# Patient Record
Sex: Female | Born: 1940 | Race: White | Hispanic: No | Smoking: Never smoker
Health system: Southern US, Community
[De-identification: ages and names within clinical notes are randomized; demographics above are authoritative.]

## PROBLEM LIST (undated history)

## (undated) DIAGNOSIS — F419 Anxiety disorder, unspecified: Principal | ICD-10-CM

## (undated) DIAGNOSIS — F329 Major depressive disorder, single episode, unspecified: Secondary | ICD-10-CM

## (undated) DIAGNOSIS — F32A Depression, unspecified: Secondary | ICD-10-CM

## (undated) DIAGNOSIS — F0390 Unspecified dementia without behavioral disturbance: Secondary | ICD-10-CM

## (undated) HISTORY — DX: Anxiety disorder, unspecified: F41.9

## (undated) HISTORY — PX: ABDOMINAL HYSTERECTOMY: SHX81

## (undated) HISTORY — DX: Major depressive disorder, single episode, unspecified: F32.9

## (undated) HISTORY — DX: Depression, unspecified: F32.A

---

## 2013-01-16 DIAGNOSIS — F329 Major depressive disorder, single episode, unspecified: Secondary | ICD-10-CM | POA: Diagnosis not present

## 2013-03-12 DIAGNOSIS — H2510 Age-related nuclear cataract, unspecified eye: Secondary | ICD-10-CM | POA: Diagnosis not present

## 2013-03-25 DIAGNOSIS — F329 Major depressive disorder, single episode, unspecified: Secondary | ICD-10-CM | POA: Diagnosis not present

## 2013-03-25 DIAGNOSIS — F3289 Other specified depressive episodes: Secondary | ICD-10-CM | POA: Diagnosis not present

## 2013-03-25 DIAGNOSIS — I2510 Atherosclerotic heart disease of native coronary artery without angina pectoris: Secondary | ICD-10-CM | POA: Diagnosis not present

## 2013-03-25 DIAGNOSIS — R300 Dysuria: Secondary | ICD-10-CM | POA: Diagnosis not present

## 2014-01-06 DIAGNOSIS — F3289 Other specified depressive episodes: Secondary | ICD-10-CM | POA: Diagnosis not present

## 2014-01-06 DIAGNOSIS — R300 Dysuria: Secondary | ICD-10-CM | POA: Diagnosis not present

## 2014-01-06 DIAGNOSIS — F329 Major depressive disorder, single episode, unspecified: Secondary | ICD-10-CM | POA: Diagnosis not present

## 2014-01-06 DIAGNOSIS — I2510 Atherosclerotic heart disease of native coronary artery without angina pectoris: Secondary | ICD-10-CM | POA: Diagnosis not present

## 2014-01-06 DIAGNOSIS — Z0000 Encounter for general adult medical examination without abnormal findings: Secondary | ICD-10-CM | POA: Diagnosis not present

## 2014-01-12 DIAGNOSIS — F329 Major depressive disorder, single episode, unspecified: Secondary | ICD-10-CM | POA: Diagnosis not present

## 2014-01-12 DIAGNOSIS — F3289 Other specified depressive episodes: Secondary | ICD-10-CM | POA: Diagnosis not present

## 2014-01-12 DIAGNOSIS — F411 Generalized anxiety disorder: Secondary | ICD-10-CM | POA: Diagnosis not present

## 2014-01-12 DIAGNOSIS — Z0000 Encounter for general adult medical examination without abnormal findings: Secondary | ICD-10-CM | POA: Diagnosis not present

## 2014-04-06 DIAGNOSIS — R0982 Postnasal drip: Secondary | ICD-10-CM | POA: Diagnosis not present

## 2014-04-06 DIAGNOSIS — H9319 Tinnitus, unspecified ear: Secondary | ICD-10-CM | POA: Diagnosis not present

## 2014-04-06 DIAGNOSIS — H903 Sensorineural hearing loss, bilateral: Secondary | ICD-10-CM | POA: Diagnosis not present

## 2014-04-06 DIAGNOSIS — R439 Unspecified disturbances of smell and taste: Secondary | ICD-10-CM | POA: Diagnosis not present

## 2015-01-19 ENCOUNTER — Encounter: Payer: Self-pay | Admitting: Primary Care

## 2015-01-19 ENCOUNTER — Ambulatory Visit (INDEPENDENT_AMBULATORY_CARE_PROVIDER_SITE_OTHER): Payer: Medicare Other | Admitting: Primary Care

## 2015-01-19 ENCOUNTER — Other Ambulatory Visit: Payer: Medicare Other

## 2015-01-19 VITALS — BP 134/74 | HR 70 | Temp 97.4°F | Ht 61.5 in | Wt 137.8 lb

## 2015-01-19 DIAGNOSIS — M25511 Pain in right shoulder: Secondary | ICD-10-CM

## 2015-01-19 DIAGNOSIS — F419 Anxiety disorder, unspecified: Principal | ICD-10-CM

## 2015-01-19 DIAGNOSIS — R5383 Other fatigue: Secondary | ICD-10-CM | POA: Diagnosis not present

## 2015-01-19 DIAGNOSIS — F418 Other specified anxiety disorders: Secondary | ICD-10-CM | POA: Diagnosis not present

## 2015-01-19 DIAGNOSIS — F329 Major depressive disorder, single episode, unspecified: Secondary | ICD-10-CM

## 2015-01-19 DIAGNOSIS — M25512 Pain in left shoulder: Secondary | ICD-10-CM

## 2015-01-19 DIAGNOSIS — M25519 Pain in unspecified shoulder: Secondary | ICD-10-CM | POA: Insufficient documentation

## 2015-01-19 DIAGNOSIS — Z79899 Other long term (current) drug therapy: Secondary | ICD-10-CM | POA: Diagnosis not present

## 2015-01-19 MED ORDER — CITALOPRAM HYDROBROMIDE 20 MG PO TABS: 20.0000 mg | ORAL_TABLET | Freq: Every day | ORAL | Status: DC

## 2015-01-19 MED ORDER — CLONAZEPAM 1 MG PO TABS: 1.0000 mg | ORAL_TABLET | ORAL | Status: DC | PRN

## 2015-01-19 MED ORDER — BUPROPION HCL ER (XL) 300 MG PO TB24: 300.0000 mg | ORAL_TABLET | Freq: Every day | ORAL | Status: DC

## 2015-01-19 NOTE — Assessment & Plan Note (Signed)
Present for one year. No injury or trauma. Takes tylenol for pain which helps. Recommended ibuprofen and ice. Will continue to monitor and consider PT if symptoms worsen.

## 2015-01-19 NOTE — Progress Notes (Signed)
Pre visit review using our clinic review tool, if applicable. No additional management support is needed unless otherwise documented below in the visit note. 

## 2015-01-19 NOTE — Progress Notes (Signed)
Subjective:    Patient ID: Joanna Reed, female    DOB: 18-Sep-1941, 74 y.o.   MRN: [id number]  HPI  Ms. Cange is a 74 year old female who presents today to establish care and discuss the problems mentioned below. Will obtain old records.  1) History of anxiety and depression: Diagnosed in her 40's. Her symptoms began with anxiety that resulted in depression. She's felt well since being on citalopram and bupropion over the years. Denies headaches, nausea, suicidal thoughts. She is also taking clonezepam only when traveling and sleeping in different places as she will become anxious and have difficulty sleeping. She will typically go through about 30 tablets of clonazepam in one year.   2) Diet and Exercise: Diet consists of organic frozen meals, salads, fish 2-3 times weekly, eats occasional beef. She watches the sugar content carefully. Exercise: 2-3 times weekly. She will do high intensity work out with steps 15 min at a time.   She will exercise her mind with brain games on the internet and crossword puzzles.  3) Occasional Fatigue: Over past several years she's noticed her energy level decrease. She feels more "drained" and cannot consistently be active for prolonged periods of time. She states this is transient and once she rests her energy level returns to baseline. Denies bloody stools or pale skin.  4) Right shoulder pain: Stiffness, painful to lift up, painful when reaching towards back. At night will have burning sensation occasionally. This has been present for about 1 year and she first noticed it after lifting her flower pots to a shelf above her head.  Denies recent injury or trauma. Tylenol helps to decrease pain. The pain does not interfere with her daily activities and she is noaware of her pain all of the time.  Review of Systems  Constitutional: Negative for fatigue and unexpected weight change.  HENT: Negative for rhinorrhea.   Respiratory: Negative for cough and  shortness of breath.   Cardiovascular: Negative for chest pain.  Gastrointestinal: Negative for diarrhea, constipation and blood in stool.       Taking probiotic daily which helps digestion.  Genitourinary: Negative for dysuria and frequency.  Musculoskeletal: Positive for arthralgias.       Right shoulder. See HPI  Skin: Negative for pallor and rash.  Neurological: Negative for dizziness, numbness and headaches.  Psychiatric/Behavioral:       Being treated for anxiety and depression       Past Medical History  Diagnosis Date  . Anxiety and depression     History   Social History  . Marital Status: Divorced    Spouse Name: N/A  . Number of Children: N/A  . Years of Education: N/A   Occupational History  . Not on file.   Social History Main Topics  . Smoking status: Never Smoker   . Smokeless tobacco: Not on file  . Alcohol Use: 0.0 oz/week    0 Standard drinks or equivalent per week     Comment: occ  . Drug Use: No  . Sexual Activity: Not on file   Other Topics Concern  . Not on file   Social History Narrative   Enjoys traveling   Daughter lives in [place] with her husband   Has 2 grandchildren in [place] (boys, 2,4)    Past Surgical History  Procedure Laterality Date  . Abdominal hysterectomy      1980    Family History  Problem Relation Age of Onset  . Arthritis Mother   .  Heart disease Father     Deceased from MI  . Stroke Maternal Grandmother 85  . Arthritis Maternal Grandfather     No Known Allergies  No current outpatient prescriptions on file prior to visit.   No current facility-administered medications on file prior to visit.    BP 134/74 mmHg  Pulse 70  Temp(Src) 97.4 F (36.3 C) (Oral)  Ht 5' 1.5" (1.562 m)  Wt 137 lb 12.8 oz (62.506 kg)  BMI 25.62 kg/m2  SpO2 96%    Objective:   Physical Exam  Constitutional: She is oriented to person, place, and time. She appears well-developed.  HENT:  Right Ear: External ear  normal.  Left Ear: External ear normal.  Nose: Nose normal.  Mouth/Throat: Oropharynx is clear and moist.  Neck: Neck supple.  Cardiovascular: Normal rate and regular rhythm.   Pulmonary/Chest: Effort normal and breath sounds normal.  Abdominal: Soft. Bowel sounds are normal.  Musculoskeletal: She exhibits no tenderness.       Right shoulder: She exhibits decreased range of motion and pain. She exhibits no tenderness, no swelling, no crepitus, normal pulse and normal strength.  Pain present with act of placing right arm behind her back but will then dissipate after arm is in position.  Lymphadenopathy:    She has no cervical adenopathy.  Neurological: She is alert and oriented to person, place, and time. She has normal reflexes. No cranial nerve deficit.  Skin: Skin is warm and dry. No pallor.  Psychiatric: She has a normal mood and affect.          Assessment & Plan:

## 2015-01-19 NOTE — Assessment & Plan Note (Signed)
Checking CBC, TSH, and CMET next week. Rule out electrolyte imbalance, anemia, hypothyroidism. Will continue to monitor.

## 2015-01-19 NOTE — Assessment & Plan Note (Signed)
Stable on citalopram and bupropion. Uses clonazepam very sparingly. Obtained controlled substance contract and UDS. Will continue to monitor.

## 2015-01-19 NOTE — Patient Instructions (Addendum)
Please schedule a fasting physical with me in the next month. Refills have been sent to your pharmacy. You may use hydrocortisone cream to your rash daily as needed. Do not apply to face. Try taking ibuprofen 400-600mg  daily for your right shoulder pain. It was very nice meeting you! See you soon! Welcome to [facility]!

## 2015-01-20 ENCOUNTER — Other Ambulatory Visit (INDEPENDENT_AMBULATORY_CARE_PROVIDER_SITE_OTHER): Payer: Medicare Other

## 2015-01-20 DIAGNOSIS — R5383 Other fatigue: Secondary | ICD-10-CM | POA: Diagnosis not present

## 2015-01-20 LAB — COMPREHENSIVE METABOLIC PANEL
ALBUMIN: 4.2 g/dL (ref 3.5–5.2)
ALK PHOS: 47 U/L (ref 39–117)
ALT: 10 U/L (ref 0–35)
AST: 14 U/L (ref 0–37)
BILIRUBIN TOTAL: 0.4 mg/dL (ref 0.2–1.2)
BUN: 16 mg/dL (ref 6–23)
CO2: 31 mEq/L (ref 19–32)
Calcium: 9.2 mg/dL (ref 8.4–10.5)
Chloride: 105 mEq/L (ref 96–112)
Creatinine, Ser: 0.77 mg/dL (ref 0.40–1.20)
GFR: 77.79 mL/min (ref >60.00)
Glucose, Bld: 98 mg/dL (ref 70–99)
Potassium: 3.9 mEq/L (ref 3.5–5.1)
SODIUM: 138 meq/L (ref 135–145)
TOTAL PROTEIN: 7 g/dL (ref 6.0–8.3)

## 2015-01-20 LAB — CBC WITH DIFFERENTIAL/PLATELET
Basophils Absolute: 0 10*3/uL (ref 0.0–0.1)
Basophils Relative: 0.7 % (ref 0.0–3.0)
EOS ABS: 0.1 10*3/uL (ref 0.0–0.7)
Eosinophils Relative: 2.3 % (ref 0.0–5.0)
HEMATOCRIT: 37.8 % (ref 36.0–46.0)
HEMOGLOBIN: 12.8 g/dL (ref 12.0–15.0)
Lymphocytes Relative: 30.8 % (ref 12.0–46.0)
Lymphs Abs: 1.7 10*3/uL (ref 0.7–4.0)
MCHC: 34 g/dL (ref 30.0–36.0)
MCV: 90.3 fl (ref 78.0–100.0)
MONOS PCT: 7.3 % (ref 3.0–12.0)
Monocytes Absolute: 0.4 10*3/uL (ref 0.1–1.0)
Neutro Abs: 3.2 10*3/uL (ref 1.4–7.7)
Neutrophils Relative %: 58.9 % (ref 43.0–77.0)
Platelets: 271 10*3/uL (ref 150.0–400.0)
RBC: 4.19 Mil/uL (ref 3.87–5.11)
RDW: 12.7 % (ref 11.5–15.5)
WBC: 5.5 10*3/uL (ref 4.0–10.5)

## 2015-01-20 LAB — TSH: TSH: 1.92 u[IU]/mL (ref 0.35–4.50)

## 2015-01-28 ENCOUNTER — Ambulatory Visit (INDEPENDENT_AMBULATORY_CARE_PROVIDER_SITE_OTHER): Payer: Medicare Other | Admitting: Primary Care

## 2015-01-28 ENCOUNTER — Encounter: Payer: Self-pay | Admitting: Primary Care

## 2015-01-28 VITALS — BP 132/80 | HR 84 | Temp 97.6°F | Ht 62.0 in | Wt 136.1 lb

## 2015-01-28 DIAGNOSIS — M25511 Pain in right shoulder: Secondary | ICD-10-CM

## 2015-01-28 DIAGNOSIS — Z83438 Family history of other disorder of lipoprotein metabolism and other lipidemia: Secondary | ICD-10-CM

## 2015-01-28 DIAGNOSIS — Z13220 Encounter for screening for lipoid disorders: Secondary | ICD-10-CM | POA: Diagnosis not present

## 2015-01-28 DIAGNOSIS — R5383 Other fatigue: Secondary | ICD-10-CM

## 2015-01-28 DIAGNOSIS — Z0000 Encounter for general adult medical examination without abnormal findings: Secondary | ICD-10-CM

## 2015-01-28 DIAGNOSIS — Z8349 Family history of other endocrine, nutritional and metabolic diseases: Secondary | ICD-10-CM

## 2015-01-28 DIAGNOSIS — E2839 Other primary ovarian failure: Secondary | ICD-10-CM

## 2015-01-28 DIAGNOSIS — Z13820 Encounter for screening for osteoporosis: Secondary | ICD-10-CM | POA: Diagnosis not present

## 2015-01-28 DIAGNOSIS — R635 Abnormal weight gain: Secondary | ICD-10-CM

## 2015-01-28 NOTE — Assessment & Plan Note (Signed)
Continues to bother patient, she's trying to take ibuprofen but doesn't like to do so. She declines PT at this time. Will refer to Dr. Lorelei Pont for further evaluation.

## 2015-01-28 NOTE — Progress Notes (Signed)
HPI:  [name] is a 74 year old female who presents today for an annual wellness subsequent exam.  Past Medical History  Diagnosis Date  . Anxiety and depression     Current Outpatient Prescriptions  Medication Sig Dispense Refill  . buPROPion (WELLBUTRIN XL) 300 MG 24 hr tablet Take 1 tablet (300 mg total) by mouth daily. 30 tablet 5  . citalopram (CELEXA) 20 MG tablet Take 1 tablet (20 mg total) by mouth daily. 30 tablet 5  . clonazePAM (KLONOPIN) 1 MG tablet Take 1 tablet (1 mg total) by mouth as needed for anxiety. 30 tablet 0   No current facility-administered medications for this visit.    No Known Allergies  Family History  Problem Relation Age of Onset  . Arthritis Mother   . Heart disease Father     Deceased from MI  . Stroke Maternal Grandmother 85  . Arthritis Maternal Grandfather     History   Social History  . Marital Status: Divorced    Spouse Name: N/A  . Number of Children: N/A  . Years of Education: N/A   Occupational History  . Not on file.   Social History Main Topics  . Smoking status: Never Smoker   . Smokeless tobacco: Not on file  . Alcohol Use: 0.0 oz/week    0 Standard drinks or equivalent per week     Comment: occ  . Drug Use: No  . Sexual Activity: Not on file   Other Topics Concern  . Not on file   Social History Narrative   Enjoys traveling   Daughter lives in [place] with her husband   Has 2 grandchildren in [place] (boys, 2,4)    Hospitiliaztions: None  Health Maintenance:    Flu: Declines  Tetanus: Declines  Pneumovax: None  Zostavax: None, Declines  Bone Density: Been over 2 years, will order.  Colonoscopy: Declines  Eye Doctor: 2 years ago. New RX for her glasses.  Dental Exam: Over 2 years ago  Providers: Pleas Koch  I have personally reviewed and have noted: 1. The patient's medical and social history 2. Their use of alcohol, tobacco or illicit drugs 3. Their current medications and  supplements 4. The patient's functional ability including ADL's, fall risks, home safety risks and hearing or visual impairment. 5. Diet and physical activities 6. Evidence for depression or mood disorder  Subjective:   Review of Systems:   Constitutional: Denies fever, malaise, fatigue, headache or abrupt weight changes.  HEENT: Denies eye pain, eye redness, ear pain, ringing in the ears, wax buildup, runny nose, nasal congestion, bloody nose, or sore throat. Respiratory: Denies difficulty breathing, shortness of breath, cough or sputum production.   Cardiovascular: Denies chest pain, chest tightness, palpitations or swelling in the hands or feet.  Gastrointestinal: Denies abdominal pain, bloating, constipation, diarrhea or blood in the stool.  GU: Denies urgency, frequency, pain with urination, burning sensation, blood in urine, odor or discharge. Musculoskeletal: Right mid humeral and shoulder pain. Pain with abduction.  Skin: Denies redness, rashes, lesions or ulcercations.  Neurological: Denies dizziness, difficulty with memory, difficulty with speech or problems with balance and coordination.  Psychiatric: History of anxiety for which she is being treated. She is stable on her medications. Denies SI/HI.  No other specific complaints in a complete review of systems (except as listed in HPI above).  Objective:  PE:   BP 132/80 mmHg  Pulse 84  Temp(Src) 97.6 F (36.4 C) (Oral)  Ht 5\' 2"  (  1.575 m)  Wt 136 lb 1.9 oz (61.744 kg)  BMI 24.89 kg/m2  SpO2 95% Wt Readings from Last 3 Encounters:  01/28/15 136 lb 1.9 oz (61.744 kg)  01/19/15 137 lb 12.8 oz (62.506 kg)    General: Appears their stated age, well developed, well nourished in NAD. Skin: Warm, dry and intact. No rashes, lesions or ulcerations noted. HEENT: Head: normal shape and size; Eyes: sclera white, no icterus, conjunctiva pink, PERRLA and EOMs intact; Ears: Tm's gray and intact, normal light reflex; Nose: mucosa  pink and moist, septum midline; Throat/Mouth: Teeth present, mucosa pink and moist, no exudate, lesions or ulcerations noted.  Neck: Normal range of motion. Neck supple, trachea midline. No massses, lumps or thyromegaly present.  Cardiovascular: Normal rate and rhythm. S1,S2 noted.  No murmur, rubs or gallops noted. No JVD or BLE edema. No carotid bruits noted. Pulmonary/Chest: Normal effort and positive vesicular breath sounds. No respiratory distress. No wheezes, rales or ronchi noted.  Abdomen: Soft and nontender. Normal bowel sounds, no bruits noted. No distention or masses noted. Liver, spleen and kidneys non palpable. Musculoskeletal: Normal range of motion. No signs of joint swelling. No difficulty with gait.  Neurological: Alert and oriented. Cranial nerves II-XII intact. Coordination normal. +DTRs bilaterally. Psychiatric: Mood and affect normal. Behavior is normal. Judgment and thought content normal.   EKG:  BMET    Component Value Date/Time   NA 138 01/20/2015 1148   K 3.9 01/20/2015 1148   CL 105 01/20/2015 1148   CO2 31 01/20/2015 1148   GLUCOSE 98 01/20/2015 1148   BUN 16 01/20/2015 1148   CREATININE 0.77 01/20/2015 1148   CALCIUM 9.2 01/20/2015 1148    Lipid Panel  No results found for: CHOL, TRIG, HDL, CHOLHDL, VLDL, LDLCALC  CBC    Component Value Date/Time   WBC 5.5 01/20/2015 1148   RBC 4.19 01/20/2015 1148   HGB 12.8 01/20/2015 1148   HCT 37.8 01/20/2015 1148   PLT 271.0 01/20/2015 1148   MCV 90.3 01/20/2015 1148   MCHC 34.0 01/20/2015 1148   RDW 12.7 01/20/2015 1148   LYMPHSABS 1.7 01/20/2015 1148   MONOABS 0.4 01/20/2015 1148   EOSABS 0.1 01/20/2015 1148   BASOSABS 0.0 01/20/2015 1148    Hgb A1C No results found for: HGBA1C    Assessment and Plan:   Medicare Annual Wellness Visit:  Diet: Heart healthy Physical activity: Exercises nearly everyday at home. Depression/mood screen: Negative Hearing: Intact to whispered voice Visual acuity:  Grossly normal, performs annual eye exam  ADLs: Capable Fall risk: None Home safety: Good Cognitive evaluation: Intact to orientation, naming, recall and repetition EOL planning: Adv directives, full code/ I agree  Preventative Medicine:  Next appointment: 6 months for follow up of anxiety, or sooner if needed.

## 2015-01-28 NOTE — Patient Instructions (Signed)
You will be contacted regarding your referral to Sports Medicine. You will be contacted regarding your Dexa Bone Scan Please come back at your convenience for your lipid panel. Please update your insurance information. Take care!

## 2015-01-28 NOTE — Progress Notes (Deleted)
Subjective:    Patient ID: Joanna Reed, female    DOB: [birth year], 74 y.o.   MRN: [id number]  HPI  Mammogram: Last mammogram was 4-5.  Influenza: Declines Zostavax: Declines Pneumonia: Declines DEXA: Last one 5-10 years ago ECG: Declines Pap: No uterus Tetanus: Declines   Review of Systems  Constitutional: Negative for fever and chills.  HENT: Negative for rhinorrhea.   Respiratory: Negative for shortness of breath.   Cardiovascular: Negative for chest pain.  Gastrointestinal: Negative for diarrhea and constipation.  Genitourinary: Negative for dysuria and frequency.  Musculoskeletal: Negative for myalgias and arthralgias.  Skin: Negative for rash.  Neurological: Negative for dizziness and headaches.  Hematological: Negative for adenopathy.  Psychiatric/Behavioral:       Denies concerns for anxiety or depression       Objective:   Physical Exam        Assessment & Plan:     Subjective:   Joanna Reed is a 74 y.o. female who presents for Medicare Annual (Subsequent) preventive examination.  Review of Systems:  ***       Objective:     Vitals: BP 132/80 mmHg  Pulse 84  Temp(Src) 97.6 F (36.4 C) (Oral)  Ht 5\' 2"  (1.575 m)  Wt 136 lb 1.9 oz (61.744 kg)  BMI 24.89 kg/m2  SpO2 95%  Tobacco History  Smoking status  . Never Smoker   Smokeless tobacco  . Not on file     Counseling given: Not Answered   Past Medical History  Diagnosis Date  . Anxiety and depression    Past Surgical History  Procedure Laterality Date  . Abdominal hysterectomy      1980   Family History  Problem Relation Age of Onset  . Arthritis Mother   . Heart disease Father     Deceased from MI  . Stroke Maternal Grandmother 85  . Arthritis Maternal Grandfather    History  Sexual Activity  . Sexual Activity: Not on file    Outpatient Encounter Prescriptions as of 01/28/2015  Medication Sig  . buPROPion (WELLBUTRIN XL) 300 MG 24 hr tablet Take 1 tablet  (300 mg total) by mouth daily.  . citalopram (CELEXA) 20 MG tablet Take 1 tablet (20 mg total) by mouth daily.  . clonazePAM (KLONOPIN) 1 MG tablet Take 1 tablet (1 mg total) by mouth as needed for anxiety.    Activities of Daily Living No flowsheet data found.  Patient Care Team: Alma Friendly, NP as PCP - General (Nurse Practitioner)    Assessment:    Follows a healthy diet. Does exercise with your steps at a very high aerobic level. Exercise Activities and Dietary recommendations    Goals    None     Fall Risk No flowsheet data found. Depression Screen No flowsheet data found.   Cognitive Testing No flowsheet data found.   There is no immunization history on file for this patient. Screening Tests Health Maintenance  Topic Date Due  . TETANUS/TDAP  08/06/1959  . MAMMOGRAM  08/05/1990  . COLONOSCOPY  08/05/1990  . ZOSTAVAX  08/05/2000  . DEXA SCAN  08/05/2005  . PNA vac Low Risk Adult (1 of 2 - PCV13) 08/05/2005  . INFLUENZA VACCINE  06/24/2015 (Originally 05/24/2015)      Plan:   *** During the course of the visit the patient was educated and counseled about the following appropriate screening and preventive services:   Vaccines to include Pneumoccal, Influenza, Hepatitis B, Td, Zostavax, HCV  Electrocardiogram  Cardiovascular Disease  Colorectal cancer screening  Bone density screening  Diabetes screening  Glaucoma screening  Mammography/PAP  Nutrition counseling   Patient Instructions (the written plan) was given to the patient.   Sheral Flow, NP  01/28/2015

## 2015-01-28 NOTE — Assessment & Plan Note (Signed)
Stable, unremarkable exam. Declines all vaccines, mammogram, colonoscopy. Will agree to Dexa scan.

## 2015-01-28 NOTE — Progress Notes (Signed)
Pre visit review using our clinic review tool, if applicable. No additional management support is needed unless otherwise documented below in the visit note. 

## 2015-01-29 ENCOUNTER — Telehealth: Payer: Self-pay | Admitting: Primary Care

## 2015-01-29 NOTE — Telephone Encounter (Signed)
Called and notified patient of [name]'s comments. Patient will be going out of town in the next 2 weeks so will call later to schedule lap appt for the lipid. Patient verbalized understanding.

## 2015-01-29 NOTE — Telephone Encounter (Signed)
Please notify Joanna Reed that I was able to get her lipid panel and Dexa scan to go through her insurance. She can have her lipid panel drawn at her convenience as I have pre-ordered it. She will be contacted regarding her Dexa scan.  Thanks!

## 2015-01-29 NOTE — Addendum Note (Signed)
Addended by: Pleas Koch on: 01/29/2015 07:26 AM   Modules accepted: Miquel Dunn

## 2015-01-29 NOTE — Addendum Note (Signed)
Addended by: Pleas Koch on: 01/29/2015 09:25 AM   Modules accepted: Orders

## 2015-02-05 ENCOUNTER — Encounter: Payer: Self-pay | Admitting: Primary Care

## 2015-02-08 NOTE — Addendum Note (Signed)
Addended by: Pleas Koch on: 02/08/2015 11:40 AM   Modules accepted: Miquel Dunn

## 2015-02-08 NOTE — Addendum Note (Signed)
Addended by: Pleas Koch on: 02/08/2015 11:44 AM   Modules accepted: Level of Service, SmartSet

## 2015-02-18 ENCOUNTER — Ambulatory Visit: Admit: 2015-02-18 | Disposition: A | Discharge status: Home / Self Care | Payer: Self-pay

## 2015-02-18 DIAGNOSIS — E2839 Other primary ovarian failure: Secondary | ICD-10-CM | POA: Diagnosis not present

## 2015-02-18 DIAGNOSIS — Z780 Asymptomatic menopausal state: Secondary | ICD-10-CM | POA: Diagnosis not present

## 2015-02-18 DIAGNOSIS — Z13820 Encounter for screening for osteoporosis: Secondary | ICD-10-CM | POA: Diagnosis not present

## 2015-03-03 ENCOUNTER — Other Ambulatory Visit (INDEPENDENT_AMBULATORY_CARE_PROVIDER_SITE_OTHER): Payer: Medicare Other

## 2015-03-03 ENCOUNTER — Encounter: Payer: Self-pay | Admitting: Family Medicine

## 2015-03-03 ENCOUNTER — Ambulatory Visit (INDEPENDENT_AMBULATORY_CARE_PROVIDER_SITE_OTHER): Payer: Medicare Other | Admitting: Family Medicine

## 2015-03-03 VITALS — BP 136/71 | HR 75 | Temp 98.7°F | Ht 62.0 in | Wt 136.2 lb

## 2015-03-03 DIAGNOSIS — M7551 Bursitis of right shoulder: Secondary | ICD-10-CM | POA: Diagnosis not present

## 2015-03-03 DIAGNOSIS — M7541 Impingement syndrome of right shoulder: Secondary | ICD-10-CM | POA: Diagnosis not present

## 2015-03-03 DIAGNOSIS — Z13220 Encounter for screening for lipoid disorders: Secondary | ICD-10-CM

## 2015-03-03 LAB — LIPID PANEL
Cholesterol: 230 mg/dL — ABNORMAL HIGH (ref 0–200)
HDL: 57.5 mg/dL (ref >39.00)
LDL CALC: 142 mg/dL — AB (ref 0–99)
NonHDL: 172.5
Total CHOL/HDL Ratio: 4
Triglycerides: 154 mg/dL — ABNORMAL HIGH (ref 0.0–149.0)
VLDL: 30.8 mg/dL (ref 0.0–40.0)

## 2015-03-03 NOTE — Progress Notes (Signed)
Pre visit review using our clinic review tool, if applicable. No additional management support is needed unless otherwise documented below in the visit note. 

## 2015-03-03 NOTE — Patient Instructions (Signed)

## 2015-03-03 NOTE — Progress Notes (Signed)
Dr. Frederico Hamman T. Mozell Hardacre, MD, [facility] [place], [address] Phone: [phone] Fax: [phone]  03/03/2015  Patient: Joanna Reed, MRN: [id number], DOB: September 30, 1941, 74 y.o.  Primary Physician:  Sheral Flow, NP  Chief Complaint: Shoulder Pain  Subjective:   This 74 y.o. female patient noted above presents with shoulder pain that has been ongoing for approx 3 years.  there is no history of trauma or accident recently - 1 year had pain when lifting a big flower pot.  The patient denies neck pain or radicular symptoms. Denies dislocation, subluxation, separation of the shoulder. The patient does complain of pain in the overhead plane with significant painful arc of motion.  At night it will hurt. Deep burning ache.  No old injuries to the shoulder.   Medications Tried: Tylenol, NSAIDS Ice or Heat: minimally helpful Tried PT: No  Prior shoulder Injury: No Prior surgery: No Prior fracture: No  The PMH, PSH, Social History, Family History, Medications, and allergies have been reviewed in [facility], and have been updated if relevant.  GEN: No fevers, chills. Nontoxic. Primarily MSK c/o today. MSK: Detailed in the HPI GI: tolerating PO intake without difficulty Neuro: No numbness, parasthesias, or tingling associated. Otherwise the pertinent positives of the ROS are noted above.   Objective:   Blood pressure 136/71, pulse 75, temperature 98.7 F (37.1 C), temperature source Oral, height 5\' 2"  (1.575 m), weight 136 lb 4 oz (61.803 kg).  GEN: Well-developed,well-nourished,in no acute distress; alert,appropriate and cooperative throughout examination HEENT: Normocephalic and atraumatic without obvious abnormalities. Ears, externally no deformities PULM: Breathing comfortably in no respiratory distress EXT: No clubbing, cyanosis, or edema PSYCH: Normally interactive. Cooperative during the interview. Pleasant.  Friendly and conversant. Not anxious or depressed appearing. Normal, full affect.  Shoulder: R Inspection: No muscle wasting or winging Ecchymosis/edema: neg  AC joint, scapula, clavicle: NT Cervical spine: NT, full ROM Spurling's: neg Abduction: full, 5/5 Flexion: full, 5/5 IR, full, lift-off: 5/5 ER at neutral: full, 5/5 AC crossover: neg Neer: pos Hawkins: pos Drop Test: neg Empty Can: pos Supraspinatus insertion: mild-mod T Bicipital groove: NT Speed's: neg Yergason's: neg Sulcus sign: neg Scapular dyskinesis: NOTABLY ALTERED MECHANICS WITH ABD C5-T1 intact  Neuro: Sensation intact Grip 5/5   Radiology: No results found.  Assessment and Plan:    Shoulder impingement, right - Plan: Ambulatory referral to Physical Therapy  Subacromial bursitis, right  >25 minutes spent in face to face time with patient, >50% spent in counselling or coordination of care   Likely, the patient had a partial thickness rotator cuff tear 1 year ago when she had her injury. By history, more likely of the supraspinatus, but now her strength and motion is fully preserved and intact. Over time, I suspect that she has developed some impingement and poor scapular mechanics leading to rotator cuff tendinopathy and subacromial bursitis.  Treatment wall primarily consist of reach rating her shoulder musculature, rotator cuff strengthening, scapular stabilization. If her pain is quite significant, then I can always do a subacromial injection in the future.  The patient could benefit from formal PT to assist with scapular stabilization and RTC strengthening.  I appreciate the opportunity to evaluate this very friendly patient. If you have any question regarding her care or prognosis, do not hesitate to ask.    Follow-up: 6-8 weeks  New Prescriptions   No medications on file   Orders Placed This Encounter  Procedures  . Ambulatory  referral to Physical Therapy    Signed,  Frederico Hamman T. Shantana Christon,  MD   Patient's Medications  New Prescriptions   No medications on file  Previous Medications   BUPROPION (WELLBUTRIN XL) 300 MG 24 HR TABLET    Take 1 tablet (300 mg total) by mouth daily.   CITALOPRAM (CELEXA) 20 MG TABLET    Take 1 tablet (20 mg total) by mouth daily.   CLONAZEPAM (KLONOPIN) 1 MG TABLET    Take 1 tablet (1 mg total) by mouth as needed for anxiety.  Modified Medications   No medications on file  Discontinued Medications   No medications on file

## 2015-03-04 ENCOUNTER — Telehealth: Payer: Self-pay | Admitting: Primary Care

## 2015-03-04 NOTE — Telephone Encounter (Signed)
Pt has called in and can not remember who called her. Please call pt back at home number,thanks,.

## 2015-03-05 NOTE — Telephone Encounter (Signed)
I'm sorry. Anda Kraft and I have not contact this patient. It may be someone in referral. Thank you.

## 2015-04-13 DIAGNOSIS — H2513 Age-related nuclear cataract, bilateral: Secondary | ICD-10-CM | POA: Diagnosis not present

## 2015-04-14 ENCOUNTER — Ambulatory Visit: Payer: Medicare Other | Admitting: Family Medicine

## 2015-06-22 ENCOUNTER — Telehealth: Payer: Self-pay | Admitting: Primary Care

## 2015-06-22 ENCOUNTER — Telehealth: Payer: Self-pay

## 2015-06-22 LAB — HM MAMMOGRAPHY

## 2015-06-22 NOTE — Telephone Encounter (Signed)
Noted  

## 2015-06-22 NOTE — Telephone Encounter (Addendum)
Called and notified patient of [name]'s comments. Patient verbalized understanding.  FYI - Patient stated that she is really healthy right now. She will not going to schedule follow up yet, she will wait on it. Leave it open is what the patient stated.

## 2015-06-22 NOTE — Telephone Encounter (Signed)
Called to notify patient about being due for a Mammogram. Patient declined any information on scheduling one.  Patient also wanted me to ask Allie Bossier about getting a 90 day supply on her Wellbutrin, and Celexa. Patient states that it would be much easier for her if she could have a 90 day supply on these medications. Please advise.

## 2015-06-22 NOTE — Telephone Encounter (Signed)
Ok to due 90 day supply on her Wellbutrin and Celexa. She needs to be scheduled for a 6 month follow up in October. Thanks.

## 2015-07-07 ENCOUNTER — Other Ambulatory Visit: Payer: Self-pay | Admitting: Primary Care

## 2015-07-07 NOTE — Telephone Encounter (Signed)
Electronically refill request for citalopram (CELEXA) 20 MG tablet  Dispense: 30 tablet   Refills: 5   Last prescribed on 01/19/2015. Last seen on 03/03/15. No future appointment.

## 2015-08-06 ENCOUNTER — Other Ambulatory Visit: Payer: Self-pay | Admitting: Primary Care

## 2015-08-06 NOTE — Telephone Encounter (Signed)
Electronically refill request  bupropPROPion (WELLBUTRIN XL) 300 MG 24 hr tablet   Take 1 tablet (300 mg total) by mouth daily.  Dispense: 30 tablet   Refills: 5     Last prescribed on 01/19/2015. Last seen on 03/03/15. No future appointment.

## 2015-08-16 ENCOUNTER — Other Ambulatory Visit: Payer: Self-pay | Admitting: Primary Care

## 2015-08-16 MED ORDER — CITALOPRAM HYDROBROMIDE 20 MG PO TABS: 20.0000 mg | ORAL_TABLET | Freq: Every day | ORAL | Status: DC

## 2015-08-16 NOTE — Telephone Encounter (Signed)
Received faxed request to change to 90 days supply for  citalopram (CELEXA) 20 MG tablet 30 tablet    TAKE 1 TABLET EVERY DAY  Dispense:   30 tablets    Refills:   5  Last prescribed on 07/07/2015. Last seen on 01/28/2015. No future appt

## 2015-09-30 ENCOUNTER — Ambulatory Visit: Payer: Medicare Other

## 2015-09-30 ENCOUNTER — Encounter: Payer: Self-pay | Admitting: Podiatry

## 2015-09-30 ENCOUNTER — Ambulatory Visit (INDEPENDENT_AMBULATORY_CARE_PROVIDER_SITE_OTHER): Payer: Medicare Other | Admitting: Podiatry

## 2015-09-30 DIAGNOSIS — M2010 Hallux valgus (acquired), unspecified foot: Secondary | ICD-10-CM

## 2015-09-30 DIAGNOSIS — R52 Pain, unspecified: Secondary | ICD-10-CM

## 2015-09-30 DIAGNOSIS — M2040 Other hammer toe(s) (acquired), unspecified foot: Secondary | ICD-10-CM | POA: Diagnosis not present

## 2015-09-30 NOTE — Progress Notes (Signed)
Subjective:    Patient ID: Joanna Reed, female    DOB: [birth year], 74 y.o.   MRN: [id number]  HPI  74 year old female presents the ossific concerns of bilateral bunions and second digit hammertoes which are crossing over the big toe. This is been ongoing for several years. She is inquiring about possible surgical intervention as she is attended multiple conservative treatments including shoe gear modifications, padding, offloading without any relief of symptoms. She states that she has difficulty wearing any shoe and she has pain on daily basis mostly on the right second toe as it is sitting on top of the big toe. She is inquiring about possible right second toe amputation and also bunion correction. No other complaints at this time.   Review of Systems  All other systems reviewed and are negative.      Objective:   Physical Exam General: AAO x3, NAD  Dermatological: Skin is warm, dry and supple bilateral. Nails x 10 are well manicured; remaining integument appears unremarkable at this time. There are no open sores, no preulcerative lesions, no rash or signs of infection present.  Vascular: Dorsalis Pedis artery and Posterior Tibial artery pedal pulses are 2/4 bilateral with immedate capillary fill time. Pedal hair growth present. There is no pain with calf compression, swelling, warmth, erythema.   Neruologic: Grossly intact via light touch bilateral. Vibratory intact via tuning fork bilateral. Protective threshold with Semmes Wienstein monofilament intact to all pedal sites bilateral. Patellar and Achilles deep tendon reflexes 2+ bilateral. No Babinski or clonus noted bilateral.   Musculoskeletal: There is significant HAV deformity present bilaterally with a right greater than left. The second digit on the right side is overlapping the hallux and the second digit appears to be a chronically dislocated position sitting on top of the hallux. On the left side the second digit is starting  to cross over the third digit however it is reducible. There is irritation of the top of the right second toe from where the second toe is rubbing the shoe on the PIPJ. The second toe is not reducible and sits in a rigid contracted position. There is no hypermobility of the bunion deformity and there is no pain or crepitation the first MTPJ range of motion. There is tenderness along the bunion and second toes bilaterally. There is hammertoe contractures to all lesser digits bilaterally.  Muscular strength 5/5 in all groups tested bilateral.  Gait: Unassisted, Nonantalgic.      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  -Treatment options discussed including all alternatives, risks, and complications -Etiology of symptoms were discussed -At this time she is attended multiple conservative treatments including shoe gear modifications, padding, offloading without any relief of symptoms. This time she is a question surgical intervention. I had a very long discussion regarding the surgical intervention with the patient. She was to have the bunions fixed discussed with the second digits as well. She would like to have the second toe amputated. I discussed with her that if she has a second toe amputated on the right foot and that she fixed the bunion there is a high chance of recurrence of the bunion as well as further digital deformity of the lesser digits that remain. She also had a wide gap present between the hallux and third toe. On the left foot discussed hammertoe correction of second digit  with K wire fixation HAV correction. I discussed that she would likely need a Lapidus or base procedure but she does not want to have the recovery discussed with her in aggressive Austin bunionectomy which may not provide her complete correction  but would hopefully give her good relief. After a very long discussion with the patient she has elected to proceed with a right second toe 8 dictation hold off on bunion correction. She states that next year she would likely undergo bunion correction. I discussed with her again that this would be total of 3 surgeries that she is likely going to have and she understands this. -However to think about her options prior to this we'll schedule the surgery. I went ahead and walk without a tentative date for January but I want her to think about her surgical options and I'll see her back in the beginning of January for further discussion and for consents and she wishes to proceed with surgery. -I spent probably 45 minutes with the patient today face-to-face time.  Celesta Gentile, DPM

## 2015-10-28 ENCOUNTER — Ambulatory Visit: Payer: Medicare Other | Admitting: Podiatry

## 2015-11-11 ENCOUNTER — Ambulatory Visit: Payer: Medicare Other | Admitting: Podiatry

## 2015-11-11 ENCOUNTER — Telehealth: Payer: Self-pay | Admitting: *Deleted

## 2015-11-11 NOTE — Telephone Encounter (Signed)
Pt states she has made a decision to cancel today's 300pm appt to discuss the B/L bunion surgeries.  Pt states she will contact our office once she has returned from her [place] trip at the end of June 2017.  Pt states she has also decided she would like to have the hammer toe wired like the other toe will be rather than removed and leaving a space.  I told pt I would inform Dr. Jacqualyn Posey and cancel her appt for today, and we look forward to seeing her at her return.

## 2015-12-27 DIAGNOSIS — M79673 Pain in unspecified foot: Secondary | ICD-10-CM

## 2016-02-01 ENCOUNTER — Encounter: Payer: Self-pay | Admitting: Primary Care

## 2016-02-01 ENCOUNTER — Ambulatory Visit (INDEPENDENT_AMBULATORY_CARE_PROVIDER_SITE_OTHER): Payer: Medicare Other | Admitting: Primary Care

## 2016-02-01 VITALS — BP 120/80 | HR 77 | Temp 98.1°F | Ht 60.5 in | Wt 135.1 lb

## 2016-02-01 DIAGNOSIS — F329 Major depressive disorder, single episode, unspecified: Secondary | ICD-10-CM

## 2016-02-01 DIAGNOSIS — F419 Anxiety disorder, unspecified: Principal | ICD-10-CM

## 2016-02-01 DIAGNOSIS — E785 Hyperlipidemia, unspecified: Secondary | ICD-10-CM

## 2016-02-01 DIAGNOSIS — F32A Depression, unspecified: Secondary | ICD-10-CM

## 2016-02-01 DIAGNOSIS — F418 Other specified anxiety disorders: Secondary | ICD-10-CM

## 2016-02-01 DIAGNOSIS — M25511 Pain in right shoulder: Secondary | ICD-10-CM

## 2016-02-01 DIAGNOSIS — Z0000 Encounter for general adult medical examination without abnormal findings: Secondary | ICD-10-CM | POA: Diagnosis not present

## 2016-02-01 DIAGNOSIS — M25512 Pain in left shoulder: Secondary | ICD-10-CM

## 2016-02-01 MED ORDER — BUPROPION HCL ER (XL) 300 MG PO TB24: 300.0000 mg | ORAL_TABLET | Freq: Every day | ORAL | Status: DC

## 2016-02-01 MED ORDER — CITALOPRAM HYDROBROMIDE 20 MG PO TABS: 20.0000 mg | ORAL_TABLET | Freq: Every day | ORAL | Status: DC

## 2016-02-01 NOTE — Assessment & Plan Note (Signed)
Well managed, would like to try 10 mg of Celexa. Discussed how to wean down and update me regarding reaction. Continue Bupropion.  Refills provided.

## 2016-02-01 NOTE — Assessment & Plan Note (Signed)
Located to both shoulders now, improved overall. Doesn't notice discomfort much.

## 2016-02-01 NOTE — Patient Instructions (Addendum)
I have sent refills of your medication to [facility]. Please notify me if you do well on 1/2 tablet of Celexa (10 mg).  Complete lab work prior to leaving today. I will notify you of your results once received.   Continue your efforts towards a healthy lifestyle.  Follow up in 1 year for repeat physical.  It was a pleasure to see you today!

## 2016-02-01 NOTE — Progress Notes (Signed)
Pre visit review using our clinic review tool, if applicable. No additional management support is needed unless otherwise documented below in the visit note. 

## 2016-02-01 NOTE — Progress Notes (Signed)
Patient ID: Joanna Reed, female   DOB: 1941/01/07, 75 y.o.   MRN: [id number]  HPI: Joanna Reed is a 75 year old female who presents today for her annual Medicare Wellness Exam.  Past Medical History  Diagnosis Date  . Anxiety and depression     Current Outpatient Prescriptions  Medication Sig Dispense Refill  . buPROPion (WELLBUTRIN XL) 300 MG 24 hr tablet TAKE 1 TABLET EVERY DAY 90 tablet 1  . citalopram (CELEXA) 20 MG tablet Take 1 tablet (20 mg total) by mouth daily. 90 tablet 1  . clonazePAM (KLONOPIN) 1 MG tablet Take 1 tablet (1 mg total) by mouth as needed for anxiety. 30 tablet 0   No current facility-administered medications for this visit.    Allergies  Allergen Reactions  . Sulfa Antibiotics     Family History  Problem Relation Age of Onset  . Arthritis Mother   . Heart disease Father     Deceased from MI  . Stroke Maternal Grandmother 85  . Arthritis Maternal Grandfather     Social History   Social History  . Marital Status: Divorced    Spouse Name: N/A  . Number of Children: N/A  . Years of Education: N/A   Occupational History  . Not on file.   Social History Main Topics  . Smoking status: Never Smoker   . Smokeless tobacco: Never Used  . Alcohol Use: 0.0 oz/week    0 Standard drinks or equivalent per week     Comment: occ  . Drug Use: No  . Sexual Activity: Not on file   Other Topics Concern  . Not on file   Social History Narrative   Enjoys traveling   Daughter lives in [place] with her husband   Has 2 grandchildren in [place] (boys, 2,4)    Hospitiliaztions: None in the last 1 year.   Health Maintenance:    Flu: Did not receive last season.  Tetanus: Declines  Pneumovax: Never received, declines  Prevnar: Never received, declines  Zostavax: Never received, declines  Bone Density: Normal in 2016  Colonoscopy: Declines  Eye Doctor: Completed 2-3 years ago. No glaucoma or cataracts.   Dental Exam: Completes every 6 months.     Mammogram: Declines.   Pap: Hysterectomy    Providers: [facility]   I have personally reviewed and have noted: 1. The patient's medical and social history 2. Their use of alcohol, tobacco or illicit drugs 3. Their current medications and supplements 4. The patient's functional ability including ADL's, fall risks, home safety risks and  hearing or visual impairment. 5. Diet and physical activities 6. Evidence for depression or mood disorder  Subjective:   Review of Systems:   Constitutional: Denies fever, malaise, fatigue, headache or abrupt weight changes.  HEENT: Denies eye pain, eye redness, ear pain, ringing in the ears, wax buildup, runny nose, nasal congestion, bloody nose, or sore throat. Respiratory: Denies difficulty breathing, shortness of breath, cough or sputum production.   Cardiovascular: Denies chest pain, chest tightness, palpitations or swelling in the hands or feet.  Gastrointestinal: Denies abdominal pain, bloating, constipation, diarrhea or blood in the stool.  GU: Denies urgency, frequency, pain with urination, burning sensation, blood in urine, odor or discharge.  Musculoskeletal: Denies decrease in range of motion, difficulty with gait, muscle pain or joint pain and swelling. Foot pain bilaterally, follows with Podiatry.  Skin: Denies redness, lesions or ulcercations. Mild rash to to left lower extremity, not bothersome for the most part.  Neurological: Denies dizziness, difficulty with memory, difficulty with speech or problems with balance and coordination.   No other specific complaints in a complete review of systems (except as listed in HPI above).  Objective:  PE:   BP 120/80 mmHg  Pulse 77  Temp(Src) 98.1 F (36.7 C)  Ht 5' 0.5" (1.537 m)  Wt 135 lb 1.9 oz (61.29 kg)  BMI 25.94 kg/m2  SpO2 96% Wt Readings from Last 3 Encounters:  02/01/16 135 lb 1.9 oz (61.29 kg)  03/03/15 136 lb 4 oz (61.803 kg)  01/28/15 136 lb 1.9 oz (61.744 kg)     General: Appears their stated age, well developed, well nourished in NAD. Skin: Warm, dry and intact. Small 1 cm rash to left anterior lower extremity that does not appear suspicious. Likely eczema. HEENT: Head: normal shape and size; Eyes: sclera white, no icterus, conjunctiva pink, PERRLA and EOMs intact; Ears: Tm's gray and intact, normal light reflex; Nose: mucosa pink and moist, septum midline; Throat/Mouth: Teeth present, mucosa pink and moist, no exudate, lesions or ulcerations noted.  Neck: Normal range of motion. Neck supple, trachea midline. No massses, lumps or thyromegaly present.  Cardiovascular: Normal rate and rhythm. S1,S2 noted.  No murmur, rubs or gallops noted. No JVD or BLE edema. No carotid bruits noted. Pulmonary/Chest: Normal effort and positive vesicular breath sounds. No respiratory distress. No wheezes, rales or ronchi noted.  Abdomen: Soft and nontender. Normal bowel sounds, no bruits noted. No distention or masses noted. Liver, spleen and kidneys non palpable. Musculoskeletal: Normal range of motion. No signs of joint swelling. No difficulty with gait.  Neurological: Alert and oriented. Cranial nerves II-XII intact. Coordination normal. +DTRs bilaterally. Psychiatric: Mood and affect normal. Behavior is normal. Judgment and thought content normal.     BMET    Component Value Date/Time   NA 138 01/20/2015 1148   K 3.9 01/20/2015 1148   CL 105 01/20/2015 1148   CO2 31 01/20/2015 1148   GLUCOSE 98 01/20/2015 1148   BUN 16 01/20/2015 1148   CREATININE 0.77 01/20/2015 1148   CALCIUM 9.2 01/20/2015 1148    Lipid Panel     Component Value Date/Time   CHOL 230* 03/03/2015 1156   TRIG 154.0* 03/03/2015 1156   HDL 57.50 03/03/2015 1156   CHOLHDL 4 03/03/2015 1156   VLDL 30.8 03/03/2015 1156   LDLCALC 142* 03/03/2015 1156    CBC    Component Value Date/Time   WBC 5.5 01/20/2015 1148   RBC 4.19 01/20/2015 1148   HGB 12.8 01/20/2015 1148   HCT 37.8  01/20/2015 1148   PLT 271.0 01/20/2015 1148   MCV 90.3 01/20/2015 1148   MCHC 34.0 01/20/2015 1148   RDW 12.7 01/20/2015 1148   LYMPHSABS 1.7 01/20/2015 1148   MONOABS 0.4 01/20/2015 1148   EOSABS 0.1 01/20/2015 1148   BASOSABS 0.0 01/20/2015 1148    Hgb A1C No results found for: HGBA1C    Assessment and Plan:   Medicare Annual Wellness Visit:  Diet: Endorses a fair diet: Breakfast: Cereal, oatmeal with nuts and fruit, occasional bacon Lunch: Skips Dinner: Salads, vegetables, lean meat Desserts: Ice cream 3-4 times weekly Beverages: Water Physical activity: Active, home work outs Depression/mood screen: Negative Hearing: Intact to whispered voice Visual acuity: Grossly normal, refused snellen evaluation today. ADLs: Capable Fall risk: None Home safety: Good Cognitive evaluation: Intact to orientation, naming, recall and repetition EOL planning: Adv directives, full code  Preventative Medicine:  Discussed the importance of a healthy diet  and regular exercise in order for weight loss and to reduce risk of other medical diseases. Declines pneumonia, shingles, and tetanus vaccinations. Declines mammogram. Labs pending. Exam unremarkable. Anticipatory guidance provided.   Next appointment: 1 year for repeat physical.

## 2016-02-01 NOTE — Assessment & Plan Note (Signed)
Declines all recommended immunizations. Declines mammogram. Discussed the importance of a healthy diet and regular exercise in order for weight loss and to reduce risk of other medical diseases. Declines pneumonia, shingles, and tetanus vaccinations. Declines mammogram. Labs pending. Exam unremarkable. Anticipatory guidance provided.  I have personally reviewed and have noted: 1. The patient's medical and social history 2. Their use of alcohol, tobacco or illicit drugs 3. Their current medications and supplements 4. The patient's functional ability including ADL's, fall risks, home safety risks and  hearing or visual impairment. 5. Diet and physical activities 6. Evidence for depression or mood disorder  Follow up in 1 year for repeat physical.

## 2016-02-02 NOTE — Addendum Note (Signed)
Addended by: Royann Shivers A on: 02/02/2016 10:50 AM   Modules accepted: Orders

## 2016-03-08 ENCOUNTER — Other Ambulatory Visit: Payer: Self-pay | Admitting: Primary Care

## 2016-03-08 DIAGNOSIS — F419 Anxiety disorder, unspecified: Principal | ICD-10-CM

## 2016-03-08 DIAGNOSIS — F329 Major depressive disorder, single episode, unspecified: Secondary | ICD-10-CM

## 2016-03-08 NOTE — Telephone Encounter (Signed)
Received fax refill request for citalopram (CELEXA) 20 MG tablet. However, last refill on 02/01/2016 90 days supply with 3 refills.

## 2016-03-09 ENCOUNTER — Other Ambulatory Visit: Payer: Self-pay | Admitting: Primary Care

## 2016-08-24 ENCOUNTER — Encounter: Payer: Self-pay | Admitting: Podiatry

## 2016-08-24 ENCOUNTER — Ambulatory Visit (INDEPENDENT_AMBULATORY_CARE_PROVIDER_SITE_OTHER): Payer: Medicare Other | Admitting: Podiatry

## 2016-08-24 DIAGNOSIS — M2042 Other hammer toe(s) (acquired), left foot: Secondary | ICD-10-CM

## 2016-08-24 DIAGNOSIS — M2010 Hallux valgus (acquired), unspecified foot: Secondary | ICD-10-CM

## 2016-08-24 DIAGNOSIS — M2041 Other hammer toe(s) (acquired), right foot: Secondary | ICD-10-CM

## 2016-08-25 NOTE — Progress Notes (Signed)
Subjective:  75 year old female presents the office they for complaints of worsening pain to her bilateral feet do the bunion and hammertoes left side worse than the right. Surrounding for several years has been worsening. Should discuss surgical intervention. She is tried shoe gear modifications, padding, offloading without any relief. The second toe appears to be causing majority problems mostly on the left foot. Denies any systemic complaints such as fevers, chills, nausea, vomiting. No acute changes since last appointment, and no other complaints at this time.   Objective: AAO x3, NAD DP/PT pulses palpable bilaterally, CRT less than 3 seconds There is significant HAV second toe appears to chronically dislocated in overlapping the hallux. Clinically the right-sided dorsalis had a left is more painful. On the left foot submetatarsal 3 is a prominence at the metatarsal head a small bursa has formed along this area. No erythema or increase in warmth. No open lesions or pre-ulcerative lesions.  No pain with calf compression, swelling, warmth, erythema  Assessment: Bilateral HAV, hammertoes with a right second toe/chronic dislocation Plan: -All treatment options discussed with the patient including all alternatives, risks, complications.  -Previous x-rays were reviewed. I discussed with her both conservative and surgical treatment options. At this time she wishes to pursue a surgical intervention. I discussed her multiple treatment options including Lapidus bunionectomy and hammertoe repair and possible metatarsal osteotomy. She states that she did not be nonweightbearing completely discussed the possible aggressive offset V. She will consider having surgery in the near future. As I will be leaving this office I will have her follow-up with Dr. Amalia Hailey for surgical consultation. I discussed with her that we'll let Dr. Amalia Hailey make the procedure choice. -Patient encouraged to call the office with any  questions, concerns, change in symptoms.   Celesta Gentile, DPM

## 2016-08-29 ENCOUNTER — Ambulatory Visit (INDEPENDENT_AMBULATORY_CARE_PROVIDER_SITE_OTHER): Payer: Medicare Other | Admitting: Podiatry

## 2016-08-29 DIAGNOSIS — M2011 Hallux valgus (acquired), right foot: Secondary | ICD-10-CM

## 2016-08-29 DIAGNOSIS — M2012 Hallux valgus (acquired), left foot: Secondary | ICD-10-CM

## 2016-08-29 DIAGNOSIS — D492 Neoplasm of unspecified behavior of bone, soft tissue, and skin: Secondary | ICD-10-CM

## 2016-08-29 DIAGNOSIS — M2042 Other hammer toe(s) (acquired), left foot: Secondary | ICD-10-CM

## 2016-08-29 DIAGNOSIS — M79671 Pain in right foot: Secondary | ICD-10-CM

## 2016-08-29 DIAGNOSIS — M21612 Bunion of left foot: Secondary | ICD-10-CM | POA: Diagnosis not present

## 2016-08-29 DIAGNOSIS — M21611 Bunion of right foot: Secondary | ICD-10-CM

## 2016-08-29 DIAGNOSIS — M79672 Pain in left foot: Secondary | ICD-10-CM

## 2016-08-29 DIAGNOSIS — M2041 Other hammer toe(s) (acquired), right foot: Secondary | ICD-10-CM

## 2016-08-31 ENCOUNTER — Telehealth: Payer: Self-pay | Admitting: *Deleted

## 2016-08-31 DIAGNOSIS — S8982XA Other specified injuries of left lower leg, initial encounter: Secondary | ICD-10-CM

## 2016-08-31 DIAGNOSIS — Z01812 Encounter for preprocedural laboratory examination: Secondary | ICD-10-CM

## 2016-08-31 NOTE — Telephone Encounter (Addendum)
-----   Message from Edrick Kins, DPM sent at 08/29/2016  1:41 PM EST ----- Regarding: MRI left foot.  Please order an MRI left foot @ [facility].  MRI left foot w/out contrast Dx : large palpable nodule/growth sub 3rd & 4th digits.   Thanks,  Dr. Amalia Hailey. 08/31/2016-I asked pt if she ha a health problem that only allowed her to have the MRI at the [facility], pt stated no, only wanted MRI facility close by. Faxed orders to [facility].11/15/2016-Pt called states she had left a message 3 days ago and the surgery coordinator had not called, and she wanted a call today. Pt wanted to know the procedure for getting a nurse to change her dressings at home after her surgery on 11/23/2016, and how to get a knee scooter, and shower chair. Pt states she would like a call today. I gave the pt's note to D. Meadows. 11/16/2016-Angie - [place] office states pt has presented to the [place] office and states she called [place] for the knee scooter and the shower chair and [facility] states they do not have orders. Telephone note 10/27/2016 Addendum to D. Meadows note states [facility] would take pt's post op care and order the knee scooter and shower from [place]. I put in orders for both to [place] and told Angie to have pt go home and I would call. I spoke with [facility] and she said she didn't have the orders and I read the fax [phone] to her and that it had confirmation of receipt. Loma Sousa states that is the Intake fax, she gave me [phone] and [phone] to fax the order. I faxed to both and neither fax would go through. I called [facility] and told her to contact Int[facility] and have them send the order to her.

## 2016-09-03 NOTE — Progress Notes (Signed)
Subjective:  Patient presents today as a referral from Dr. Jacqualyn Posey for surgical consult regarding bilateral bunion deformities. Patient states that the bunions have been symptomatic for several years and progressively become worse. All conservative modalities have been unsuccessful in providing any sort of satisfactory alleviation of symptoms for the patient. She has tried shoe gear modifications, padding, offloading without any relief. She also complains of hammertoe deformities bilateral. Patient also has a new complaint of a large palpable mass to the plantar aspect of digits 3-4 of the left foot. She states that it is changed in size recently. Patient presents today for surgical consult and further treatment and evaluation.    Objective/Physical Exam General: The patient is alert and oriented x3 in no acute distress.  Dermatology: Skin is warm, dry and supple bilateral lower extremities. Negative for open lesions or macerations.  Vascular: Palpable pedal pulses bilaterally. No edema or erythema noted. Capillary refill within normal limits.  Neurological: Epicritic and protective threshold grossly intact bilaterally.   Musculoskeletal Exam: Clinical evaluation presents with hallux abductovalgus deformity and a large bunion deformity with prominence of the medial aspect of the first metatarsal head with hallux abductus greater than 30. Hammertoe contractures digits 2-5 noted bilaterally.  There is a new finding of a large palpable nodule on the plantar aspect of the third and fourth digits of the left foot. This nodule appears abnormal and does not seem to correlate normal anatomic structure. 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 All patient questions were answered #2 today we discussed in detail the conservative versus surgical management of bunion deformity and hammertoe correction. At the moment the patient opts for surgical correction, however before we proceed MRI of the left foot needs to be obtained to addressed the large mass on the plantar forefoot #3 MRI left forefoot For return to clinic in 4 weeks   Dr. Edrick Kins, [place]

## 2016-09-12 ENCOUNTER — Ambulatory Visit
Admission: RE | Admit: 2016-09-12 | Discharge: 2016-09-12 | Disposition: A | Discharge status: Home / Self Care | Payer: Medicare Other | Source: Ambulatory Visit | Attending: Podiatry | Admitting: Podiatry

## 2016-09-12 ENCOUNTER — Other Ambulatory Visit: Payer: Self-pay | Admitting: Podiatry

## 2016-09-12 DIAGNOSIS — M25872 Other specified joint disorders, left ankle and foot: Secondary | ICD-10-CM | POA: Insufficient documentation

## 2016-09-12 DIAGNOSIS — S8982XA Other specified injuries of left lower leg, initial encounter: Secondary | ICD-10-CM | POA: Diagnosis not present

## 2016-09-12 DIAGNOSIS — R2242 Localized swelling, mass and lump, left lower limb: Secondary | ICD-10-CM | POA: Diagnosis not present

## 2016-09-22 ENCOUNTER — Ambulatory Visit: Payer: Medicare Other | Admitting: Podiatry

## 2016-09-22 ENCOUNTER — Ambulatory Visit (INDEPENDENT_AMBULATORY_CARE_PROVIDER_SITE_OTHER): Payer: Medicare Other | Admitting: Podiatry

## 2016-09-22 DIAGNOSIS — M2011 Hallux valgus (acquired), right foot: Secondary | ICD-10-CM | POA: Diagnosis not present

## 2016-09-22 DIAGNOSIS — M21612 Bunion of left foot: Secondary | ICD-10-CM

## 2016-09-22 DIAGNOSIS — M79672 Pain in left foot: Secondary | ICD-10-CM

## 2016-09-22 DIAGNOSIS — D492 Neoplasm of unspecified behavior of bone, soft tissue, and skin: Secondary | ICD-10-CM

## 2016-09-22 DIAGNOSIS — M79671 Pain in right foot: Secondary | ICD-10-CM | POA: Diagnosis not present

## 2016-09-22 DIAGNOSIS — M2041 Other hammer toe(s) (acquired), right foot: Secondary | ICD-10-CM

## 2016-09-22 DIAGNOSIS — M2012 Hallux valgus (acquired), left foot: Secondary | ICD-10-CM | POA: Diagnosis not present

## 2016-09-22 DIAGNOSIS — M21611 Bunion of right foot: Secondary | ICD-10-CM | POA: Diagnosis not present

## 2016-09-22 DIAGNOSIS — M2042 Other hammer toe(s) (acquired), left foot: Secondary | ICD-10-CM

## 2016-09-24 NOTE — Progress Notes (Signed)
Subjective:  Patient presents to the office today for follow-up evaluation of a soft tissue mass to the plantar aspect of the left foot as well as bunion deformities and hammertoe deformities bilateral lower extremities. Patient states that the soft tissue mass is painful and has increased in size over the past several weeks. Patient also states that her bunion and hammertoe deformities are very painful bilaterally. Patient is here for surgical consult.    Objective/Physical Exam General: The patient is alert and oriented x3 in no acute distress.  Dermatology: Skin is warm, dry and supple bilateral lower extremities. Negative for open lesions or macerations.  Vascular: Palpable pedal pulses bilaterally. No edema or erythema noted. Capillary refill within normal limits.  Neurological: Epicritic and protective threshold grossly intact bilaterally.   Musculoskeletal Exam: Large palpable mass underlying the third digit of the left foot approximately 3 cm in diameter. Hallux abductovalgus deformity noted to the bilateral lower extremities with an intermetatarsal angle greater than 15. Crossover deformity noted of the first and second digits bilateral. Hammertoe contracture digits 2-5 bilateral lower extremities.  Assessment: #1 soft tissue mass plantar aspect of the left foot #2 hallux abductovalgus deformity bilateral #3 hammertoe contracture deformity digits 2-5 bilateral #4 pain in bilateral feet   Plan of Care:  #1 Patient was evaluated. #2 today the MRI results were reviewed and all patient questions were answered regarding the soft tissue tumor of the left foot. 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 #4 today authorization for surgery  was initiated. Surgery will consist of excision of soft tissue tumor left foot.  all patient questions were answered. No guarantees were expressed or implied #5 return to clinic 1 week postop to schedule corrective bunion and hammertoe surgery. #6 postoperative shoe dispensed today   Dr. Edrick Kins, [place]

## 2016-09-25 ENCOUNTER — Telehealth: Payer: Self-pay | Admitting: *Deleted

## 2016-09-25 NOTE — Telephone Encounter (Signed)
"  I'm calling to schedule some surgery for next week.  I need to do it as soon as possible.  I am kind of tight with scheduling."

## 2016-09-26 NOTE — Telephone Encounter (Signed)
I am returning your call.  How can I help you?  "I need to schedule my surgery.  I need to do it as soon as possible."  He can do it on December 28.  "That is way to long.  He said it needed to be done right a way.  What can of system do you all have?  He told me he had plenty of time."  I need to go over the schedule with Dr. Amalia Hailey.  His schedule is full.  "I have made all sorts of arrangements for this surgery.  You all need to work this out and leave me out of it!  I have never had an experience like this."  I will discuss it with Dr. Amalia Hailey and give you a call back.  "You will call me tomorrow right?"  I will call you whenever I get a response from Dr. Amalia Hailey.  I spoke to Dr. Amalia Hailey and he said he could do your surgery on Thursday of next week.  "What date is that?"  That is December 14.  "Will someone give me the time later?"  Someone from the surgical center will call you with the arrival time a day or two prior to surgery date.  "Tell me that date again."  It will be done on December 14.

## 2016-10-04 ENCOUNTER — Telehealth: Payer: Self-pay | Admitting: *Deleted

## 2016-10-04 NOTE — Telephone Encounter (Signed)
"  I am calling about my surgery scheduled for tomorrow.  What type of anesthesia will I be getting?"  I think you will be getting a general.  "He told me I wouldn't be put to sleep.  He said I would get a local or a block."  Let me ask him and see what he says.  He said you could have a local or a leg block, not general.  "What is the difference?"  The local will be injected around the lesion could stay numb for a couple of hours.  The leg block could cause your leg to be numb a couple of days.  "Okay, I am fine with either/or."

## 2016-10-05 ENCOUNTER — Encounter: Payer: Self-pay | Admitting: Podiatry

## 2016-10-05 DIAGNOSIS — M25872 Other specified joint disorders, left ankle and foot: Secondary | ICD-10-CM | POA: Diagnosis not present

## 2016-10-05 DIAGNOSIS — Z01818 Encounter for other preprocedural examination: Secondary | ICD-10-CM | POA: Diagnosis not present

## 2016-10-05 DIAGNOSIS — D481 Neoplasm of uncertain behavior of connective and other soft tissue: Secondary | ICD-10-CM | POA: Diagnosis not present

## 2016-10-05 DIAGNOSIS — D2122 Benign neoplasm of connective and other soft tissue of left lower limb, including hip: Secondary | ICD-10-CM | POA: Diagnosis not present

## 2016-10-09 NOTE — Progress Notes (Signed)
DOS 12.14.2017 Excision of Soft Tissue Mass Left Foot

## 2016-10-12 ENCOUNTER — Ambulatory Visit (INDEPENDENT_AMBULATORY_CARE_PROVIDER_SITE_OTHER): Payer: Self-pay | Admitting: Podiatry

## 2016-10-12 DIAGNOSIS — Z9889 Other specified postprocedural states: Secondary | ICD-10-CM

## 2016-10-13 NOTE — Progress Notes (Signed)
Subjective: Patient presents today status post excision of mass to the left forefoot. Date of surgery 10/05/2016. Patient states that she's doing well. Patient experienced no pain. Patient presents today for further treatment and evaluation.  Objective: Skin incision is well coapted with sutures intact. No sign of infectious process. Minimal edema noted.  Assessment: Status post excision of mass of unknown etiology left foot. Date of surgery 10/05/2016.  Plan of care: Today the patient was evaluated. Biopsy results were reviewed which were positive for fibroma. Return to clinic in 1 week for dressing change.  Sutures will be taken out at week 3 postop

## 2016-10-20 ENCOUNTER — Encounter: Payer: Self-pay | Admitting: Podiatry

## 2016-10-20 ENCOUNTER — Ambulatory Visit (INDEPENDENT_AMBULATORY_CARE_PROVIDER_SITE_OTHER): Payer: Medicare Other | Admitting: Podiatry

## 2016-10-20 DIAGNOSIS — M21612 Bunion of left foot: Secondary | ICD-10-CM | POA: Diagnosis not present

## 2016-10-20 DIAGNOSIS — M2042 Other hammer toe(s) (acquired), left foot: Secondary | ICD-10-CM

## 2016-10-20 DIAGNOSIS — M2012 Hallux valgus (acquired), left foot: Secondary | ICD-10-CM

## 2016-10-20 DIAGNOSIS — Z9889 Other specified postprocedural states: Secondary | ICD-10-CM | POA: Diagnosis not present

## 2016-10-20 DIAGNOSIS — M21622 Bunionette of left foot: Secondary | ICD-10-CM | POA: Diagnosis not present

## 2016-10-20 NOTE — Progress Notes (Signed)
Subjective:  Patient presents today status post excision of a fibroma lesion to her left forefoot. Date of surgery 10/05/2016. Patient states that she's doing well. Patient has no pain.  Patient also presents today for surgical consult regarding reconstructive surgery of the left forefoot. Patient states that she is ready to schedule surgery. Patient presents today for further treatment and for surgical consult.    Objective/Physical Exam General: The patient is alert and oriented x3 in no acute distress.  Dermatology: Skin is warm, dry and supple bilateral lower extremities. Negative for open lesions or macerations.  Vascular: Palpable pedal pulses bilaterally. No edema or erythema noted. Capillary refill within normal limits.  Neurological: Epicritic and protective threshold grossly intact bilaterally.   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 Adductovarus deformity fifth digit left foot.  Radiographic Exam:  Previous radiographic exam on 09/29/2016 is consistent with hallux abductovalgus and tailor's bunion deformity. Increased intermetatarsal angle noted with in the first interspace greater than 15. Disarticulation of the first metatarsal phalangeal joint noted. Increased intermetatarsal angle of the fourth interspace left foot noted greater than 10. There is degenerative changes within the metatarsal phalangeal joints of the left foot.  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 #2 today conservative versus surgical management of forefoot reconstructive surgery was discussed. All  patient questions were answered. Patient opts for surgical correction. All details and possible complications were explained. No guarantees were expressed or implied #3 authorization for surgery initiated today: Surgery will consist of #1 bunionectomy with double osteotomy left foot #2 MPJ capsulotomy 2-3 left foot #3 while osteotomy second metatarsal left foot #4 PIPJ arthroplasty digits 2, 3, 5 left foot #5 tailor's bunionectomy left foot #4 patient will require prescription for shower chair #5 patient will require prescription for knee scooter #6 patient will require home health dressing changes every other day 6 weeks postop #7 return to clinic 1 week postop   Edrick Kins, DPM [facility]  Dr. Edrick Kins, [address]                                        [place], [place] [address]                Office [phone]  Fax [phone]

## 2016-10-20 NOTE — Patient Instructions (Signed)

## 2016-10-25 ENCOUNTER — Telehealth: Payer: Self-pay | Admitting: *Deleted

## 2016-10-25 DIAGNOSIS — M21622 Bunionette of left foot: Secondary | ICD-10-CM

## 2016-10-25 DIAGNOSIS — M2012 Hallux valgus (acquired), left foot: Secondary | ICD-10-CM

## 2016-10-25 DIAGNOSIS — M2042 Other hammer toe(s) (acquired), left foot: Secondary | ICD-10-CM

## 2016-10-25 NOTE — Telephone Encounter (Signed)
"  I was told to call you to set up my surgery.  I'm a patient of Dr. Amalia Hailey."  When would you like to schedule it.  "I have a friend coming to town to take me so I need to do it based on when she can do it.  I can do it January 18, 25 or the following Thursday."  He can do it February 1.  "That date will be fine.  So when and where do I get my shower chair, knee scooter and who will be doing my dressing changes."  I will have to speak with his nurse, Melody, to see if the order was placed and if so, from which company.  "Well I'd like to know something today.  I am the type of person that likes to have things in order in advance instead of waiting the last minute."  I will see what I can find out for you.   January 4,  "I talked to you yesterday and you set me up with a surgical date.  You said you would have someone call me back about the scooter and other amenities I would need when I got home.  You said you would talk to someone and have her call me.  She hasn't called me yet.  I would appreciate it if you would take care of that because I don't know who to contact.  I assume my surgery will be at the same place but you didn't give me a time.  I'd appreciate you calling me back with that information."  January 5 I am returning your call.  The order has been put in for home health and prescriptions for knee scooter and shower chair.  You will hear from [facility] regarding where to go for equipment and who will be coming to you for the home health care.  I can not give you an arrival time for surgery.  Someone from the surgical center will call you a day or two before with the arrival time.  The reason I can't is because they may have cancellations, a diabetic patient or a child that needs to go first.  "I like to prepare in advance because I will have to arrange for someone to take me."  All I can tell you is it will be before noon.  "Well that helps, thank you."

## 2016-10-27 NOTE — Telephone Encounter (Addendum)
Joanna Reed - [facility] states they are accepting new pts and I explained pt's care would be needed beginning 12/02/2016 after surgery, and she would also need knee scooter and shower chair. Joanna Reed states they would order the DMEs from [facility]. Faxed orders to [facility].

## 2016-11-13 ENCOUNTER — Telehealth: Payer: Self-pay | Admitting: *Deleted

## 2016-11-13 NOTE — Telephone Encounter (Signed)
"  I've talked to you a couple of times.  I am scheduled for surgery on February 1.  I need some clarification about the appliances like the scooter and the chair for the shower.  I know that that has already been taken care of.  I need to know when that is going to be delivered or if it will be delivered or will I get it when I go for my surgery?  I don't have answers to these questions.  I look forward to hearing from you."

## 2016-11-16 ENCOUNTER — Telehealth: Payer: Self-pay | Admitting: Podiatry

## 2016-11-16 ENCOUNTER — Telehealth: Payer: Self-pay | Admitting: *Deleted

## 2016-11-16 NOTE — Telephone Encounter (Addendum)
-----   Message from Marlou Sa sent at 11/16/2016  3:49 PM EST ----- Dawn from [facility] called questioning if this patient's surgery has been r/s to a different date. She is asking for a call back ASAP. Phone # for Arrie Aran is [phone]. I spoke with Dawn and she states she has tried to contact pt multiple times and has gotten no response. Dawn states every thing is ready and waiting for orders from Dr. Amalia Hailey for post op care. I asked Dawn if I could give pt her phone number and she stated yes. I told pt that I had spoken with [facility] and to give her a call to help ease her mind that everything was set up. Pt states "why do I have to call her if everything is set up, I just want to know that everything will be ready when I get home from surgery." I told pt that would be what Dawn would discuss as well as coverage. 11/21/2016-Pt states she is having surgery day after tomorrow and has not received a call concerning her surgery time and she would like a call as soon as possible to get the time so she can make plans. I spoke with [facility] and asked they call pt today with her surgery time so pt could make plans. 12/14/2016-Female caller states she is on the way to [facility] office, pt had initially decided not to take the narcotic prescription, but now the anesthesia has worn off she would like the narcotic. Female caller states she could not find the [place] office and is going to [place] to pick up the rx. I told her I would let the [facility] office staff know, to check and make sure it was not in her paperwork. The female caller states listen to what I am saying. I told her I did, but sometimes the prescription is given anyway because of situation like this, but I will call the [place] office to have it prepared. 12/18/2016-Dawn Prowers states she would like up dated home health care orders for pt after the 12/22/2016 1st POV. I noted on 12/22/2016 schedule  and routed message to Dr. Amalia Hailey requesting new home health care orders. 12/25/2016-Dawn [place] states they need orders for pt's wound care post op. Dr. Amalia Hailey states clean area with normal saline, paint incision sites with betadine cover with telfa, and wrap with kerlix, reapply surgical shoe, for 3x week. Left message informing D. Lorelle Formosa - [facility] of the orders and faxed to [facility].01/05/2017-Lisa - [facility] says pt states Dr. Amalia Hailey said she no longer needed Home health care, they need orders for discharge. Faxed orders of 01/05/2017 1:13pm to [facility].01/08/2017-Lisa - [facility] asked for wound care or discharge orders. I told her pt is to have continued wound care and I had faxed to her office 01/05/2017. 01/08/2017-Lisa - [facility] states pt said absolutely not to the wound care, she said Dr. Amalia Hailey had said she didn't need home health care.01/09/2017-I informed Lattie Haw - [facility] of Dr. Amalia Hailey statement that if pt doesn't want home health care discharge.

## 2016-11-16 NOTE — Telephone Encounter (Signed)
I am returning your call.  I spoke to you before about the scooter and shower chair.  "The order is in the computer.  I put the order in on October 27, 2016.  You can go by the [place] office and get the orders.  You can take the order to [facility] or [place] to get the scooter and the shower chair.  "I just left there and I was told there was not any orders there."  It is in the computer.  I will call them and let them know you are going to come by to get it.  I put in a request with [facility] regarding your dressing changes, scooter and chair.  I am not sure if they cover your territory.  "Why would I use them when I can use someone close to here.  That other lady told me she called [facility]."  I am just trying to help you because you have made several calls inquiring about home health care.  "I guess I will have to go back by the [place] office.  I will go over there now."  I will let them know.  (Dr. Amalia Hailey please sign orders.)

## 2016-11-16 NOTE — Telephone Encounter (Signed)
Patient came to the office today very upset that she has called [place] many times and has left numerous messages and no one has called her back. She is having surgery on Feb 1 with Dr. Amalia Hailey. She has not been contacted by home health about her dressing changes, no knee scooter or shower chair has been ordered. Patient has called several times and asked for a return call about these items as well as if she will need a surgical boot? ( she was not given one at her last visit) 10/20/17. She has called [facility] today and they have told her they dont have any orders for her from Dr. Amalia Hailey.  She came in very upset and would like a call from a nurse or from [facility] to discuss these issues. Patient asked for the office managers name/ number to file a complaint.

## 2016-11-16 NOTE — Addendum Note (Signed)
Addended by: Harriett Sine D on: 11/16/2016 03:27 PM   Modules accepted: Orders

## 2016-11-21 ENCOUNTER — Telehealth: Payer: Self-pay | Admitting: *Deleted

## 2016-11-21 NOTE — Telephone Encounter (Signed)
"  I am having surgery the day after tomorrow and I was told that I would be told what time it would be schedule.  It's getting pretty close and I need to get that as soon as possible.  I have someone that is coming down from [place] and she is just waiting so we can coordinate our plans.  The last time I tried to coordinate with someone at this number they never called me back.  Please call me as soon as possible."  I am returning your call.  I spoke to you last week.  Someone from the surgical center normally calls a day or two prior to surgery date to give you the arrival time or you can call them.  "I just spoke to someone from there, it has been taken care of."

## 2016-12-01 ENCOUNTER — Encounter: Payer: Medicare Other | Admitting: Podiatry

## 2016-12-08 ENCOUNTER — Encounter: Payer: Medicare Other | Admitting: Podiatry

## 2016-12-14 ENCOUNTER — Other Ambulatory Visit: Payer: Self-pay

## 2016-12-14 ENCOUNTER — Encounter: Payer: Self-pay | Admitting: Podiatry

## 2016-12-14 DIAGNOSIS — M21542 Acquired clubfoot, left foot: Secondary | ICD-10-CM | POA: Diagnosis not present

## 2016-12-14 DIAGNOSIS — M2022 Hallux rigidus, left foot: Secondary | ICD-10-CM | POA: Diagnosis not present

## 2016-12-14 DIAGNOSIS — M2042 Other hammer toe(s) (acquired), left foot: Secondary | ICD-10-CM | POA: Diagnosis not present

## 2016-12-14 DIAGNOSIS — M7752 Other enthesopathy of left foot: Secondary | ICD-10-CM | POA: Diagnosis not present

## 2016-12-14 DIAGNOSIS — M2012 Hallux valgus (acquired), left foot: Secondary | ICD-10-CM | POA: Diagnosis not present

## 2016-12-14 MED ORDER — OXYCODONE-ACETAMINOPHEN 10-325 MG PO TABS: 1.0000 | ORAL_TABLET | Freq: Four times a day (QID) | ORAL | 0 refills | Status: DC | PRN

## 2016-12-14 NOTE — Progress Notes (Signed)
Nurse from surgery center called and stated that the patient had not been written a pain Rx. She just underwent foot reconstruction surgery. Patient thought she had some pain medication at home but does not. Nurse at the surgical center states that the pt is requesting pain medication and was not given a prescription from Dr Amalia Hailey.   Pt is to come to [facility] to pick up pain medication Rx. Percocet 10/325mg  # 30 Q 6hrs prn pain per Dr Paulla Dolly

## 2016-12-15 ENCOUNTER — Telehealth: Payer: Self-pay | Admitting: *Deleted

## 2016-12-15 NOTE — Telephone Encounter (Signed)
POV call:  Patient states she is doing well. She is having some pain, but taking her pain meds when she needs it. Asked if she could sleep without the boot. I advised her the reason she needs to sleep with the boot on is for protection and with taking pain meds it can make you incoherent and I wouldn't want her to get up at night and walk on the foot being unaware that the boot was not on. She states that she has to get some sleep and the boot at night was preventing that. Confirmed pt appointment for next week.

## 2016-12-20 NOTE — Progress Notes (Signed)
DOS 02.22.2018 Bunionectomy left foot. Hammertoe repair digits two,three,five left. Tailors bunionectomy left. Any other indicated procedure left foot.

## 2016-12-22 ENCOUNTER — Ambulatory Visit (INDEPENDENT_AMBULATORY_CARE_PROVIDER_SITE_OTHER): Payer: Medicare Other | Admitting: Podiatry

## 2016-12-22 ENCOUNTER — Ambulatory Visit (INDEPENDENT_AMBULATORY_CARE_PROVIDER_SITE_OTHER): Payer: Medicare Other

## 2016-12-22 VITALS — BP 149/87 | HR 76 | Temp 98.6°F | Resp 16

## 2016-12-22 DIAGNOSIS — M2012 Hallux valgus (acquired), left foot: Secondary | ICD-10-CM

## 2016-12-22 DIAGNOSIS — M2042 Other hammer toe(s) (acquired), left foot: Secondary | ICD-10-CM

## 2016-12-22 DIAGNOSIS — M21622 Bunionette of left foot: Secondary | ICD-10-CM

## 2016-12-22 DIAGNOSIS — Z9889 Other specified postprocedural states: Secondary | ICD-10-CM

## 2016-12-29 ENCOUNTER — Encounter: Payer: Medicare Other | Admitting: Podiatry

## 2017-01-02 ENCOUNTER — Ambulatory Visit (INDEPENDENT_AMBULATORY_CARE_PROVIDER_SITE_OTHER): Payer: Medicare Other | Admitting: Podiatry

## 2017-01-02 ENCOUNTER — Encounter: Payer: Medicare Other | Admitting: Podiatry

## 2017-01-02 DIAGNOSIS — Z9889 Other specified postprocedural states: Secondary | ICD-10-CM

## 2017-01-02 MED ORDER — GENTAMICIN SULFATE 0.1 % EX CREA: 1.0000 "application " | TOPICAL_CREAM | Freq: Every day | CUTANEOUS | 0 refills | Status: DC

## 2017-01-02 NOTE — Progress Notes (Signed)
Subjective: Patient presents today status post forefoot reconstructive surgery to the left foot. Date of surgery 12/14/2016. Patient states that she has been minimally walking in her postoperative shoe. Patient denies significant pain.  Objective: Skin incisions are well coapted with staples intact. Percutaneous fixation pins are intact. The percutaneous fixation pin to the second ray has distracted distally approximately 1 cm. Moderate edema noted throughout the left foot. Next  Assessment: Status post forefoot reconstructive surgery left foot. Date of surgery 12/14/2016.  Plan of care: Today the patient was evaluated. Staples were removed. Prescription for gentamicin cream. Return to clinic in 3 weeks for percutaneous pin fixation removal and to initiate physical therapy.

## 2017-01-04 NOTE — Progress Notes (Signed)
Subjective: Patient presents today for her pros first postoperative visit for left forefoot reconstructive surgery. It is surgery 09/13/2017. Patient states that she's doing very well. Patient denies a significant amount of pain. Denies fever.  Objective: Skin incisions are well coapted with staples intact. Orthopedic hardware appears intact and stable on radiographic exam. There are multiple small trauma blisters noted to the left forefoot dorsal. Blisters appear superficial in nature filled with serosanguineous drainage. Negative for any sign of infection.  Assessment: Status post left forefoot reconstructive surgery. Date of surgery 12/14/2016. Next  Plan of care: Today the patient was evaluated. Postoperative shoe was dispensed today. Home health care orders were placed today. Return to clinic in 1 week.  Edrick Kins, DPM [facility]  Dr. Edrick Kins, [address]                                        [place], [place] [address]                Office [phone]  Fax [phone]

## 2017-01-05 ENCOUNTER — Telehealth: Payer: Self-pay | Admitting: *Deleted

## 2017-01-05 NOTE — Telephone Encounter (Addendum)
-----   Message from Edrick Kins, DPM sent at 01/04/2017  8:01 PM EDT ----- Regarding: Home Health Orders Please provide Home Health Dressing Changes 3x/week x 4 weeks.  Dx : left forefoot reconstructive surgery. DOS 12/14/16.   - Cleanse foot with normal saline. Dry.  - Applied antibiotic cream to all incision sites and percutaneous pin sites.  - Dressed with nonadherent gauze, 4 x 4 gauze, 2" kling, 4 inch Coban or Ace wrap.    thanks, Dr. Amalia Hailey. Faxed orders of 01/05/2017 to [facility].

## 2017-01-05 NOTE — Telephone Encounter (Signed)
Just please. Home health care 3x/week x 4 weeks.  Dx : s/p forefoot reconstructive surgery left foot. DOS : 12/14/16  - Cleanse all wounds with normal saline. Dry.  - Applied gentamicin cream to all incision sites and percutaneous pin sites - Dressed with nonadherent gauze, 4x4 gauze, 2" kling, Ace wrap or Coban Thanks, Dr. Amalia Hailey

## 2017-01-08 NOTE — Telephone Encounter (Signed)
Sure. Dr. Zurie Platas

## 2017-01-16 ENCOUNTER — Telehealth: Payer: Self-pay | Admitting: *Deleted

## 2017-01-16 ENCOUNTER — Ambulatory Visit: Payer: Medicare Other

## 2017-01-16 ENCOUNTER — Ambulatory Visit (INDEPENDENT_AMBULATORY_CARE_PROVIDER_SITE_OTHER): Payer: Medicare Other | Admitting: Podiatry

## 2017-01-16 DIAGNOSIS — M21622 Bunionette of left foot: Secondary | ICD-10-CM

## 2017-01-16 DIAGNOSIS — M2012 Hallux valgus (acquired), left foot: Secondary | ICD-10-CM

## 2017-01-16 DIAGNOSIS — Z9889 Other specified postprocedural states: Secondary | ICD-10-CM

## 2017-01-16 DIAGNOSIS — M2042 Other hammer toe(s) (acquired), left foot: Secondary | ICD-10-CM

## 2017-01-16 DIAGNOSIS — R600 Localized edema: Secondary | ICD-10-CM

## 2017-01-16 NOTE — Progress Notes (Signed)
Subjective: Patient presents today status post forefoot reconstructive surgery to the left foot. Date of surgery 12/14/2016. Patient states that she has been minimally walking in her postoperative shoe. Patient denies significant pain.  Objective: Skin incisions are well coapted. Percutaneous fixation pins are intact. Moderate edema noted diffusely throughout the left forefoot. No sign of infectious process.  Assessment: Status post forefoot reconstructive surgery left foot. Date of surgery 12/14/2016.  Plan of care: Today the patient was evaluated. Percutaneous fixation pins were removed today. Today we are going to initiate physical therapy. Patient you can begin showering in 2 days. Compression anklet dispensed. Return to clinic in 4 weeks  Edrick Kins, DPM [facility]  Dr. Edrick Kins, [address]                                        [place], [place] [address]                Office [phone]  Fax [phone]

## 2017-01-16 NOTE — Addendum Note (Signed)
Addended by: Graceann Congress D on: 01/16/2017 11:01 AM   Modules accepted: Orders

## 2017-01-16 NOTE — Telephone Encounter (Addendum)
-----   Message from Edrick Kins, DPM sent at 01/16/2017 10:08 AM EDT ----- Regarding: Physical Therapy Please order physical therapy. 3x/week x 4 weeks Dx: s/p forefoot reconstruction left foot. DOS 12/14/16  - Increase range of motion to metatarsal phalangeal joints.  - Decrease swelling - Providing gait training. Patient may begin to transition into good supportive sneakers  Thanks, Dr. Amalia Hailey.Faxed required form and demographics to [facility].

## 2017-01-23 ENCOUNTER — Encounter: Payer: Medicare Other | Admitting: Podiatry

## 2017-02-13 ENCOUNTER — Ambulatory Visit (INDEPENDENT_AMBULATORY_CARE_PROVIDER_SITE_OTHER): Payer: Medicare Other

## 2017-02-13 ENCOUNTER — Ambulatory Visit (INDEPENDENT_AMBULATORY_CARE_PROVIDER_SITE_OTHER): Payer: Medicare Other | Admitting: Podiatry

## 2017-02-13 DIAGNOSIS — M2012 Hallux valgus (acquired), left foot: Secondary | ICD-10-CM

## 2017-02-13 DIAGNOSIS — M2042 Other hammer toe(s) (acquired), left foot: Secondary | ICD-10-CM

## 2017-02-13 DIAGNOSIS — M21622 Bunionette of left foot: Secondary | ICD-10-CM | POA: Diagnosis not present

## 2017-02-13 DIAGNOSIS — Z9889 Other specified postprocedural states: Secondary | ICD-10-CM

## 2017-02-14 NOTE — Progress Notes (Signed)
Subjective: Patient presents today status post forefoot reconstructive surgery to the left foot. Date of surgery 12/14/2016. Patient states that she is doing much better. She reports intermittent, mild pain and reports feeling of tightness across the toes.  Objective: Skin incisions are well coapted. Moderate edema noted diffusely throughout the left forefoot. No sign of infectious process.  Assessment: Status post forefoot reconstructive surgery left foot. Date of surgery 12/14/2016.  Plan of care: Today the patient was evaluated. Pt doing well so instructed her to begin increasing her activity. Dispensed silicone toe spacers to place between 2nd and 3rd toes. Return to clinic in three months for follow up X-Rays.   Edrick Kins, DPM [facility]  Dr. Edrick Kins, [address]                                        [place], [place] [address]                Office [phone]  Fax [phone]

## 2017-02-19 ENCOUNTER — Other Ambulatory Visit: Payer: Self-pay | Admitting: Primary Care

## 2017-02-19 DIAGNOSIS — F32A Depression, unspecified: Secondary | ICD-10-CM

## 2017-02-19 DIAGNOSIS — F419 Anxiety disorder, unspecified: Principal | ICD-10-CM

## 2017-02-19 DIAGNOSIS — F329 Major depressive disorder, single episode, unspecified: Secondary | ICD-10-CM

## 2017-02-21 NOTE — Telephone Encounter (Signed)
Pt left v/m requesting cb;  I called pt back and pt request refill citalopram and bupropion to [facility]; I advised pt since she has CPX scheduled for 02/27/17 I could refill meds for # 30 until pt seen for CPX. Pt said she does not want 30 she wants 90. I advised I was giving # 30 so pt would not be out of med until seen. Pt said she has enough med to last until she is seen on 02/27/17. I advised pt if she has med to take until seen it would be better to wait until seen to get refills updated and then would most likely be able to get refilled # 90. Pt voiced understanding. FYI to Allie Bossier NP.

## 2017-02-27 ENCOUNTER — Ambulatory Visit (INDEPENDENT_AMBULATORY_CARE_PROVIDER_SITE_OTHER): Payer: Medicare Other | Admitting: Primary Care

## 2017-02-27 ENCOUNTER — Encounter: Payer: Self-pay | Admitting: Primary Care

## 2017-02-27 VITALS — BP 110/70 | HR 58 | Temp 97.8°F | Ht 60.75 in | Wt 133.2 lb

## 2017-02-27 DIAGNOSIS — M25562 Pain in left knee: Secondary | ICD-10-CM | POA: Diagnosis not present

## 2017-02-27 DIAGNOSIS — M25561 Pain in right knee: Secondary | ICD-10-CM | POA: Diagnosis not present

## 2017-02-27 DIAGNOSIS — M25569 Pain in unspecified knee: Secondary | ICD-10-CM

## 2017-02-27 DIAGNOSIS — F419 Anxiety disorder, unspecified: Secondary | ICD-10-CM | POA: Diagnosis not present

## 2017-02-27 DIAGNOSIS — F329 Major depressive disorder, single episode, unspecified: Secondary | ICD-10-CM | POA: Diagnosis not present

## 2017-02-27 DIAGNOSIS — G8929 Other chronic pain: Secondary | ICD-10-CM | POA: Insufficient documentation

## 2017-02-27 DIAGNOSIS — Z0000 Encounter for general adult medical examination without abnormal findings: Secondary | ICD-10-CM | POA: Diagnosis not present

## 2017-02-27 DIAGNOSIS — E785 Hyperlipidemia, unspecified: Secondary | ICD-10-CM

## 2017-02-27 MED ORDER — BUPROPION HCL ER (XL) 300 MG PO TB24: 300.0000 mg | ORAL_TABLET | Freq: Every day | ORAL | 3 refills | Status: DC

## 2017-02-27 MED ORDER — CITALOPRAM HYDROBROMIDE 20 MG PO TABS: 20.0000 mg | ORAL_TABLET | Freq: Every day | ORAL | 3 refills | Status: DC

## 2017-02-27 NOTE — Assessment & Plan Note (Signed)
Doing well on Wellbutrin and Celexa, refills provided. Denies SI/HI. PHQ 2 score of 0 today.

## 2017-02-27 NOTE — Patient Instructions (Addendum)
Continue to work on improvements in your diet.  Start exercising slowly. You should be getting 150 minutes of moderate intensity exercise weekly.  Schedule a lab only appointment to return fasting for labs. You may have water and black coffee only.  I sent refills for your citalopram and bupropion to your pharmacy.  Follow up in 1 year for your annual exam or sooner if needed.  It was a pleasure to see you today!

## 2017-02-27 NOTE — Assessment & Plan Note (Signed)
Located to bilateral knees gradually. Suspect arthritis. Will have her continue to work on regular exercise, discussed water aerobics, low impact exercise. Offered physical therapy for which she declines. Follow up PRN.

## 2017-02-27 NOTE — Assessment & Plan Note (Signed)
Declines all immunizations. Declines mammogram and bone density scan. Colonoscopy UTD. Overall healthy diet, will have her start exercising gradually since foot surgery. Exam unremarkable, labs pending. All recommendations provided at end of visit today.  I have personally reviewed and have noted: 1. The patient's medical and social history 2. Their use of alcohol, tobacco or illicit drugs 3. Their current medications and supplements 4. The patient's functional ability including ADL's, fall risks,  home safety risks and hearing or visual impairment. 5. Diet and physical activities 6. Evidence for depression or mood disorder

## 2017-02-27 NOTE — Progress Notes (Signed)
Pre visit review using our clinic review tool, if applicable. No additional management support is needed unless otherwise documented below in the visit note. 

## 2017-02-27 NOTE — Progress Notes (Signed)
Patient ID: Joanna Reed, female   DOB: 1941-05-01, 76 y.o.   MRN: [id number]  HPI: Joanna Reed is a 76 year old female who presents today for her [facility].  Past Medical History:  Diagnosis Date  . Anxiety and depression     Current Outpatient Prescriptions  Medication Sig Dispense Refill  . buPROPion (WELLBUTRIN XL) 300 MG 24 hr tablet Take 1 tablet (300 mg total) by mouth daily. 90 tablet 3  . citalopram (CELEXA) 20 MG tablet Take 1 tablet (20 mg total) by mouth daily. 90 tablet 3  . clonazePAM (KLONOPIN) 1 MG tablet Take 1 tablet (1 mg total) by mouth as needed for anxiety. 30 tablet 0   No current facility-administered medications for this visit.     Allergies  Allergen Reactions  . Sulfa Antibiotics     Family History  Problem Relation Age of Onset  . Arthritis Mother   . Heart disease Father     Deceased from MI  . Stroke Maternal Grandmother 85  . Arthritis Maternal Grandfather     Social History   Social History  . Marital status: Divorced    Spouse name: N/A  . Number of children: N/A  . Years of education: N/A   Occupational History  . Not on file.   Social History Main Topics  . Smoking status: Never Smoker  . Smokeless tobacco: Never Used  . Alcohol use 0.0 oz/week     Comment: occ  . Drug use: No  . Sexual activity: Not on file   Other Topics Concern  . Not on file   Social History Narrative   Enjoys traveling   Daughter lives in [place] with her husband   Has 2 grandchildren in [place] (boys, 2,4)    Hospitiliaztions: None.  Health Maintenance:    Flu: Did not receive last season. Typically declines.  Tetanus: Declines.  Pneumovax: Declines  Prevnar: Declines  Zostavax: Declines  Bone Density: Normal in 2016, declines this year.  Colonoscopy: Completed 6 years ago.  Eye Doctor: Completed several years ago.  Dental Exam: Completes semi-annually  Mammogram: Declines  Pap: Hysterectomy      Providers: Dr. Amalia Hailey, Podiatry;  Joanna Reed, Joanna Reed; Dr. Murray Hodgkins, Optometry   I have personally reviewed and have noted: 1. The patient's medical and social history 2. Their use of alcohol, tobacco or illicit drugs 3. Their current medications and supplements 4. The patient's functional ability including ADL's, fall risks, home safety  risks and hearing or visual impairment. 5. Diet and physical activities 6. Evidence for depression or mood disorder  Subjective:   Review of Systems:   Constitutional: Denies fever, malaise, fatigue, headache or abrupt weight changes.  HEENT: Denies eye pain, eye redness, ear pain, ringing in the ears, wax buildup, runny nose, nasal congestion, bloody nose, or sore throat. Respiratory: Denies difficulty breathing, shortness of breath, cough or sputum production.   Cardiovascular: Denies chest pain, chest tightness, palpitations or swelling in the hands or feet.  Gastrointestinal: Denies abdominal pain, bloating, constipation, diarrhea or blood in the stool.  GU: Denies urgency, frequency, pain with urination, burning sensation, blood in urine, odor or discharge. Musculoskeletal: Working with podiatry for foot treatment. History of 2 foot surgeries. Overall recovering well. Has noticed stiffness with pain to knees intermittently. Overall tolerable.  Skin: Denies redness, rashes, lesions or ulcercations.  Neurological: Denies dizziness, difficulty with memory, difficulty with speech or problems with balance and coordination.  Psychiatric: Denies concerns for anxiety or depression.   No  other specific complaints in a complete review of systems (except as listed in HPI above).  Objective:  PE:   There were no vitals taken for this visit. Wt Readings from Last 3 Encounters:  02/01/16 135 lb 1.9 oz (61.3 kg)  03/03/15 136 lb 4 oz (61.8 kg)  01/28/15 136 lb 1.9 oz (61.7 kg)    General: Appears their stated age, well developed, well nourished in NAD. Skin: Warm, dry and intact. No  rashes, lesions or ulcerations noted. HEENT: Head: normal shape and size; Eyes: sclera white, no icterus, conjunctiva pink, PERRLA and EOMs intact; Ears: Tm's gray and intact, normal light reflex; Nose: mucosa pink and moist, septum midline; Throat/Mouth: Teeth present, mucosa pink and moist, no exudate, lesions or ulcerations noted.  Neck: Normal range of motion. Neck supple, trachea midline. No massses, lumps or thyromegaly present.  Cardiovascular: Normal rate and rhythm. S1,S2 noted.  No murmur, rubs or gallops noted. No JVD or BLE edema. No carotid bruits noted. Pulmonary/Chest: Normal effort and positive vesicular breath sounds. No respiratory distress. No wheezes, rales or ronchi noted.  Abdomen: Soft and nontender. Normal bowel sounds, no bruits noted. No distention or masses noted. Liver, spleen and kidneys non palpable. Musculoskeletal: Normal range of motion. No signs of joint swelling. No difficulty with gait.  Neurological: Alert and oriented. Cranial nerves II-XII intact. Coordination normal. +DTRs bilaterally. Psychiatric: Mood and affect normal. Behavior is normal. Judgment and thought content normal.     BMET    Component Value Date/Time   NA 138 01/20/2015 1148   K 3.9 01/20/2015 1148   CL 105 01/20/2015 1148   CO2 31 01/20/2015 1148   GLUCOSE 98 01/20/2015 1148   BUN 16 01/20/2015 1148   CREATININE 0.77 01/20/2015 1148   CALCIUM 9.2 01/20/2015 1148    Lipid Panel     Component Value Date/Time   CHOL 230 (H) 03/03/2015 1156   TRIG 154.0 (H) 03/03/2015 1156   HDL 57.50 03/03/2015 1156   CHOLHDL 4 03/03/2015 1156   VLDL 30.8 03/03/2015 1156   LDLCALC 142 (H) 03/03/2015 1156    CBC    Component Value Date/Time   WBC 5.5 01/20/2015 1148   RBC 4.19 01/20/2015 1148   HGB 12.8 01/20/2015 1148   HCT 37.8 01/20/2015 1148   PLT 271.0 01/20/2015 1148   MCV 90.3 01/20/2015 1148   MCHC 34.0 01/20/2015 1148   RDW 12.7 01/20/2015 1148   LYMPHSABS 1.7 01/20/2015 1148    MONOABS 0.4 01/20/2015 1148   EOSABS 0.1 01/20/2015 1148   BASOSABS 0.0 01/20/2015 1148    Hgb A1C No results found for: HGBA1C    Assessment and Plan:   Medicare Annual Wellness Visit:  Diet: She endorses a healthy diet. Breakfast: Cereal with nuts and fruits, sandwich (egg, avocado, canadian bacon) Lunch: Skips Dinner: TV dinners Snacks: Nuts, dried fruit, cheese, chips Desserts: Small portions of ice cream several times weekly. Beverages: Water, juices, wine (nightly) Physical activity: Active, is not regularly exercising. Depression/mood screen: Negative Hearing: Intact to whispered voice Visual acuity: Grossly normal, performs annual eye exam  ADLs: Capable Fall risk: None Home safety: Good Cognitive evaluation: Intact to orientation, naming, recall and repetition EOL planning: Adv directives, full code/ I agree  Preventative Medicine: Declines all immunizations. Declines mammogram and bone density scan. Colonoscopy UTD. Overall healthy diet, will have her start exercising gradually since foot surgery. Exam unremarkable, labs pending. All recommendations provided at end of visit today.  Next appointment: 1 year for  follow up.

## 2017-03-01 ENCOUNTER — Other Ambulatory Visit: Payer: Self-pay | Admitting: *Deleted

## 2017-03-01 DIAGNOSIS — F329 Major depressive disorder, single episode, unspecified: Secondary | ICD-10-CM

## 2017-03-01 DIAGNOSIS — F419 Anxiety disorder, unspecified: Principal | ICD-10-CM

## 2017-03-01 MED ORDER — CLONAZEPAM 1 MG PO TABS: 1.0000 mg | ORAL_TABLET | ORAL | 0 refills | Status: DC | PRN

## 2017-03-01 NOTE — Telephone Encounter (Signed)
Pt was seen 02/27/17 and pt request refill clonazepam 1 mg to [facility]. Last filled # 30 on 01/19/15.Please advise.

## 2017-03-01 NOTE — Telephone Encounter (Signed)
Rx called in to requested pharmacy 

## 2017-03-05 ENCOUNTER — Other Ambulatory Visit: Payer: Medicare Other

## 2017-03-26 ENCOUNTER — Other Ambulatory Visit: Payer: Self-pay | Admitting: Primary Care

## 2017-03-26 ENCOUNTER — Other Ambulatory Visit (INDEPENDENT_AMBULATORY_CARE_PROVIDER_SITE_OTHER): Payer: Medicare Other

## 2017-03-26 DIAGNOSIS — E785 Hyperlipidemia, unspecified: Secondary | ICD-10-CM

## 2017-03-26 DIAGNOSIS — R739 Hyperglycemia, unspecified: Secondary | ICD-10-CM

## 2017-03-26 LAB — COMPREHENSIVE METABOLIC PANEL
ALBUMIN: 4.4 g/dL (ref 3.5–5.2)
ALK PHOS: 49 U/L (ref 39–117)
ALT: 11 U/L (ref 0–35)
AST: 14 U/L (ref 0–37)
BUN: 20 mg/dL (ref 6–23)
CALCIUM: 9.2 mg/dL (ref 8.4–10.5)
CO2: 28 mEq/L (ref 19–32)
Chloride: 105 mEq/L (ref 96–112)
Creatinine, Ser: 0.85 mg/dL (ref 0.40–1.20)
GFR: 69 mL/min (ref >60.00)
Glucose, Bld: 108 mg/dL — ABNORMAL HIGH (ref 70–99)
POTASSIUM: 3.9 meq/L (ref 3.5–5.1)
Sodium: 141 mEq/L (ref 135–145)
TOTAL PROTEIN: 7 g/dL (ref 6.0–8.3)
Total Bilirubin: 0.3 mg/dL (ref 0.2–1.2)

## 2017-03-26 LAB — LIPID PANEL
Cholesterol: 197 mg/dL (ref 0–200)
HDL: 56 mg/dL (ref >39.00)
LDL Cholesterol: 122 mg/dL — ABNORMAL HIGH (ref 0–99)
NonHDL: 141.43
TRIGLYCERIDES: 98 mg/dL (ref 0.0–149.0)
Total CHOL/HDL Ratio: 4
VLDL: 19.6 mg/dL (ref 0.0–40.0)

## 2017-03-29 ENCOUNTER — Encounter: Payer: Self-pay | Admitting: *Deleted

## 2017-05-08 ENCOUNTER — Other Ambulatory Visit: Payer: Self-pay | Admitting: Otolaryngology

## 2017-05-08 DIAGNOSIS — R42 Dizziness and giddiness: Secondary | ICD-10-CM

## 2017-05-10 ENCOUNTER — Ambulatory Visit: Payer: Medicare Other

## 2017-05-15 ENCOUNTER — Ambulatory Visit: Payer: Medicare Other | Admitting: Podiatry

## 2017-05-22 ENCOUNTER — Other Ambulatory Visit: Payer: Self-pay | Admitting: Otolaryngology

## 2017-05-22 DIAGNOSIS — R42 Dizziness and giddiness: Secondary | ICD-10-CM

## 2017-05-28 ENCOUNTER — Telehealth: Payer: Self-pay | Admitting: Podiatry

## 2017-05-28 NOTE — Telephone Encounter (Signed)
Today we received a letter faxed over from [facility] stating they have tried many times to reach the patient to schedule her PT eval and treatment. They have not been able to reach her by the phone number we have on file. I am dropping a copy of the letter in the mail to the patients home address with a note for her to call them to schedule her P/T evaluation.

## 2017-06-07 ENCOUNTER — Ambulatory Visit
Admission: RE | Admit: 2017-06-07 | Discharge: 2017-06-07 | Disposition: A | Discharge status: Home / Self Care | Payer: Medicare Other | Source: Ambulatory Visit | Attending: Otolaryngology | Admitting: Otolaryngology

## 2017-06-07 DIAGNOSIS — I6523 Occlusion and stenosis of bilateral carotid arteries: Secondary | ICD-10-CM | POA: Insufficient documentation

## 2017-06-07 DIAGNOSIS — R42 Dizziness and giddiness: Secondary | ICD-10-CM

## 2017-10-17 IMAGING — MR MR FOOT*L* W/O CM
6 series · 36 of 40 positions shown · non-contrast
Comparison: None.

CLINICAL DATA: Mass in the bottom of the foot were the toes meet
the foot.

EXAM:
MRI OF THE LEFT FOOT WITHOUT CONTRAST
TECHNIQUE: Multiplanar, multisequence MR imaging of the left foot was
performed. No intravenous contrast was administered.

[Series 4: T1 · coronal · 3.0mm · 0.75mm/px · 8 of 37 slices shown (1 of 2)]
[im 1/37]
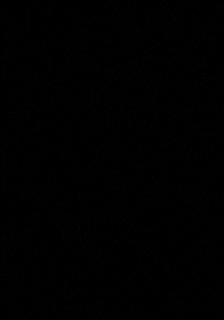
[im 6/37]
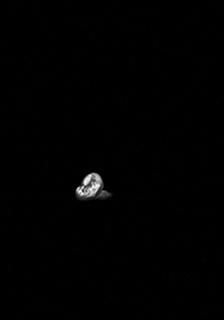
[im 11/37]
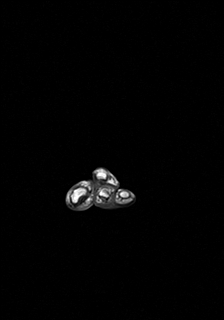
[im 16/37]
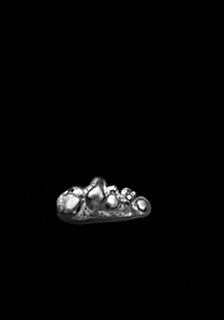
[im 21/37]
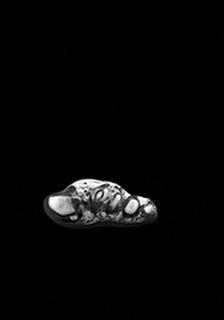
[im 26/37]
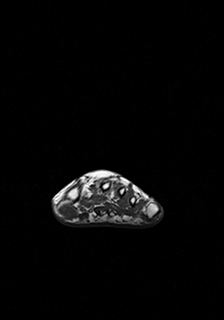
[im 31/37]
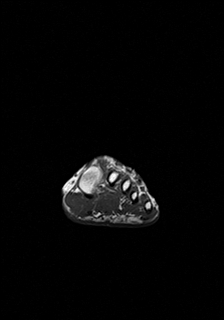
[im 37/37]
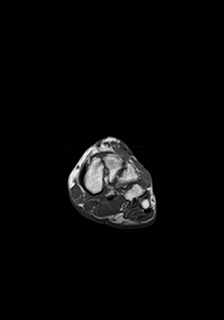

[Series 5: T2 fat-sat · coronal · 3.0mm · 0.43mm/px · 8 of 37 slices shown (1 of 3)]
[im 1/37]
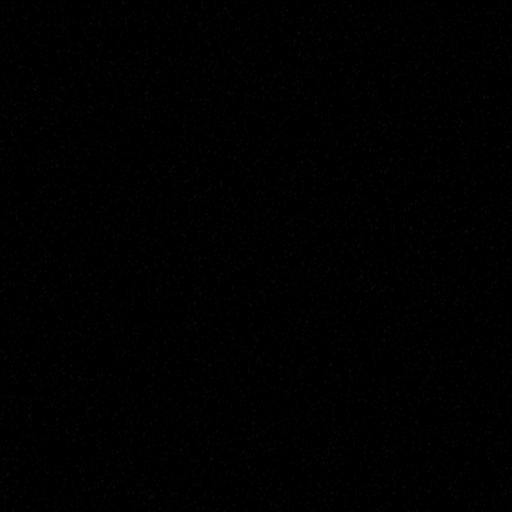
[im 6/37]
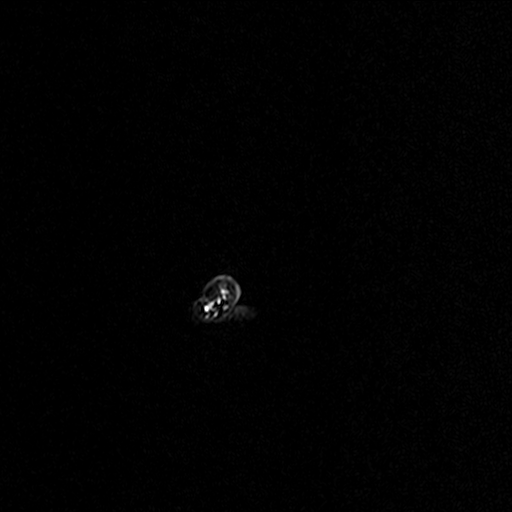
[im 11/37]
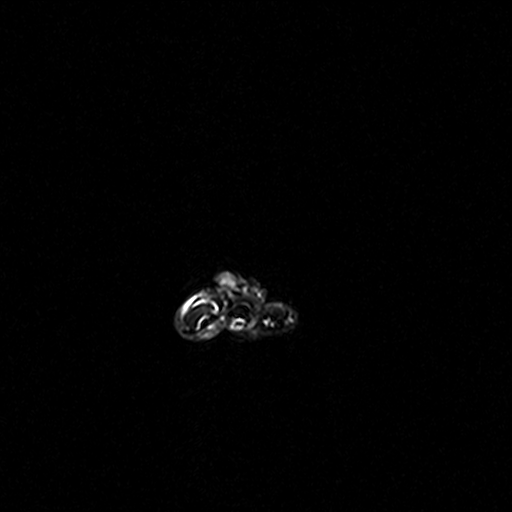
[im 16/37]
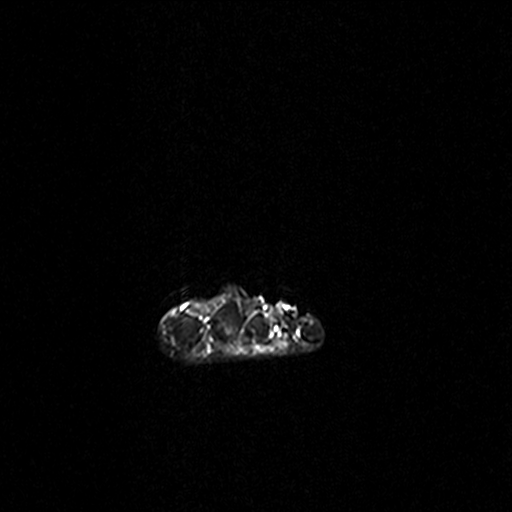
[im 21/37]
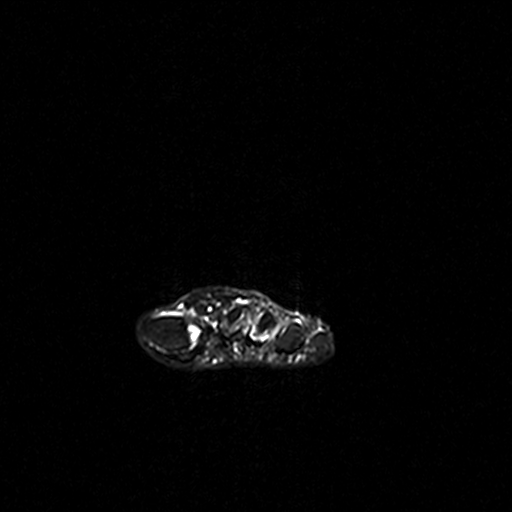
[im 26/37]
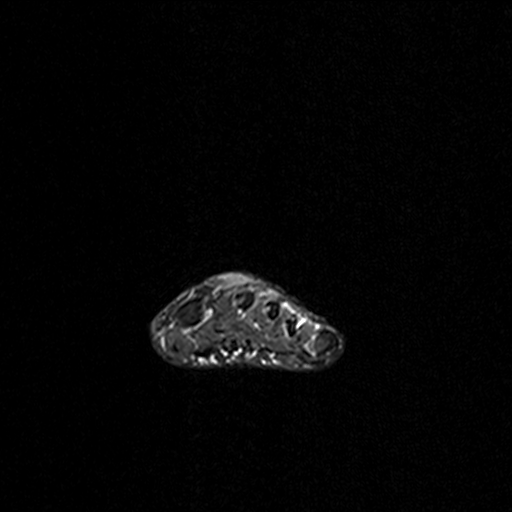
[im 31/37]
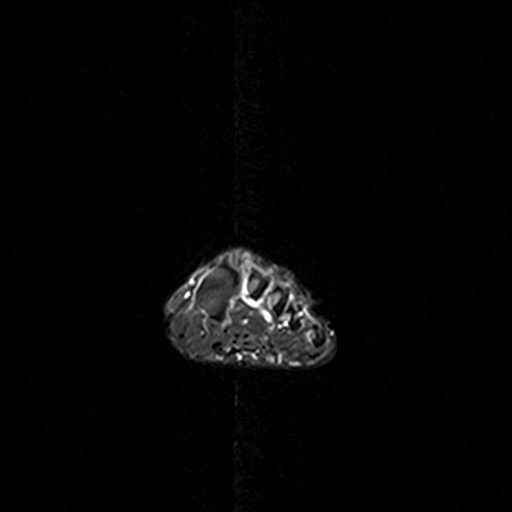
[im 37/37]
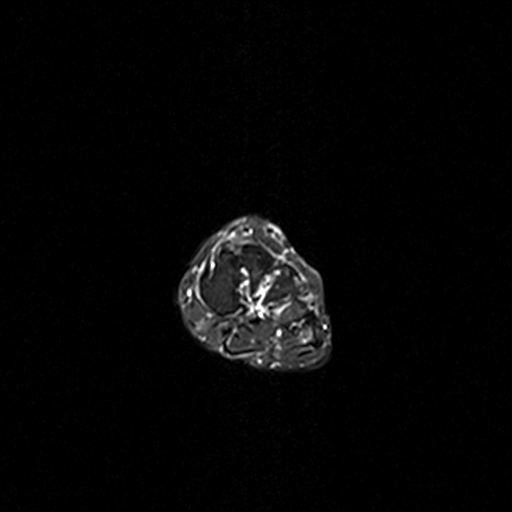

[Series 6: T1 · axial · 3.0mm · 0.56mm/px · z∈[-73,-11]mm · 5 of 23 slices shown (2 of 2)]
[im 1/23]
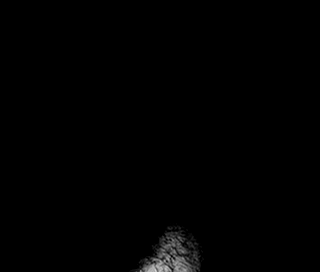
[im 6/23]
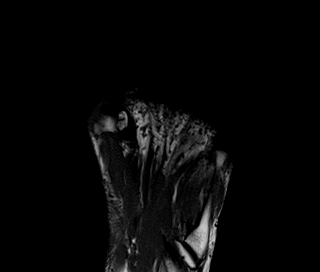
[im 12/23]
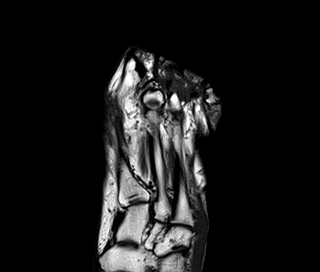
[im 17/23]
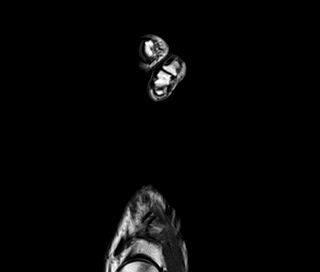
[im 23/23]
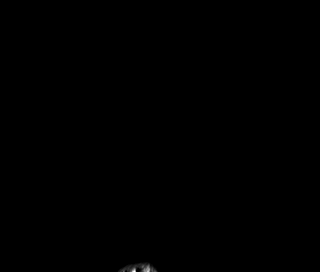

[Series 7: T2 fat-sat · axial · 3.0mm · 0.47mm/px · z∈[-74,-11]mm · 5 of 23 slices shown (2 of 3)]
[im 1/23]
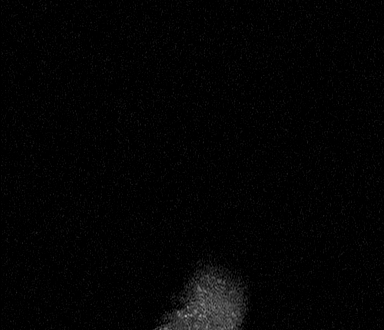
[im 6/23]
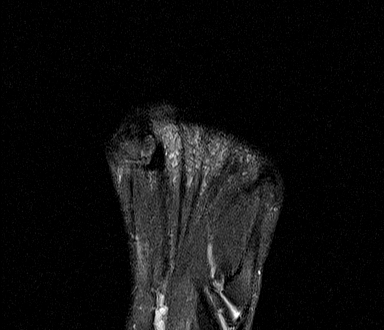
[im 12/23]
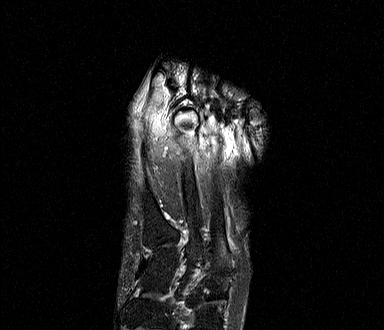
[im 17/23]
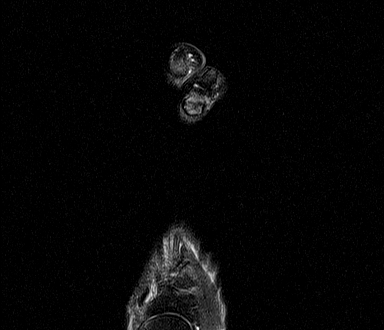
[im 23/23]
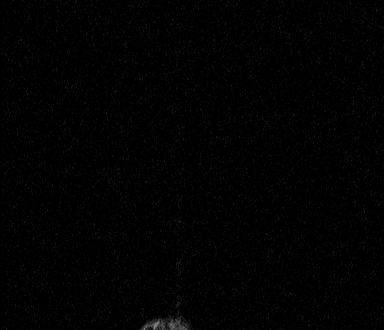

[Series 8: T2 fat-sat · sagittal · 3.0mm · 0.70mm/px · 7 of 29 slices shown (3 of 3)]
[im 1/29]
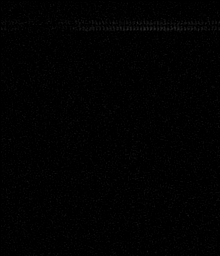
[im 5/29]
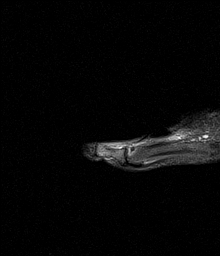
[im 10/29]
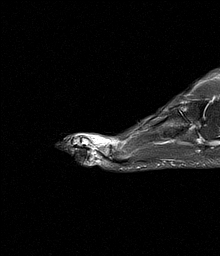
[im 15/29]
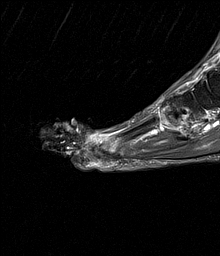
[im 19/29]
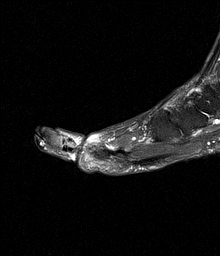
[im 24/29]
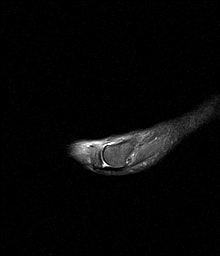
[im 29/29]
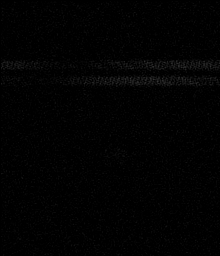

[Series 9: STIR · sagittal · 3.0mm · 0.35mm/px · 3 of 29 slices shown]
[im 1/29]
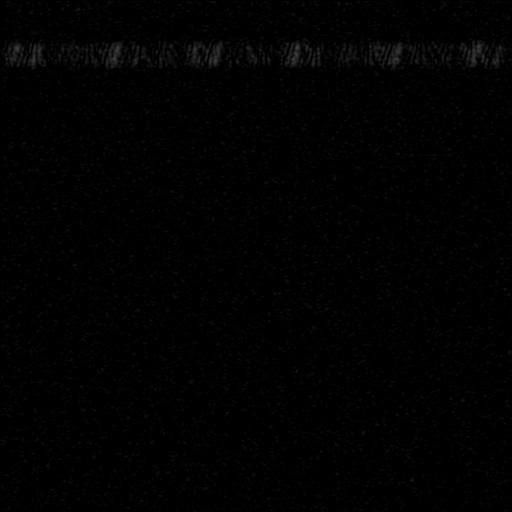
[im 5/29]
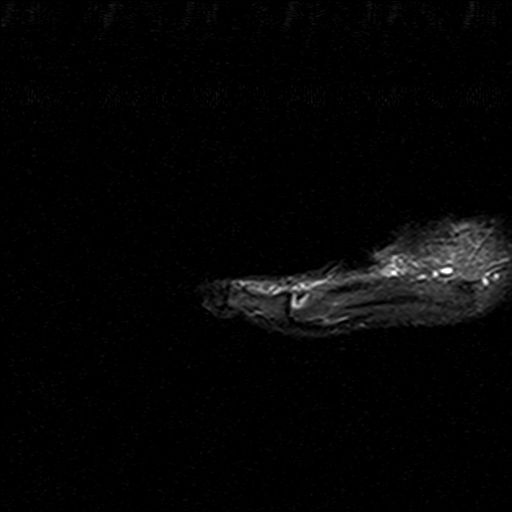
[im 10/29]
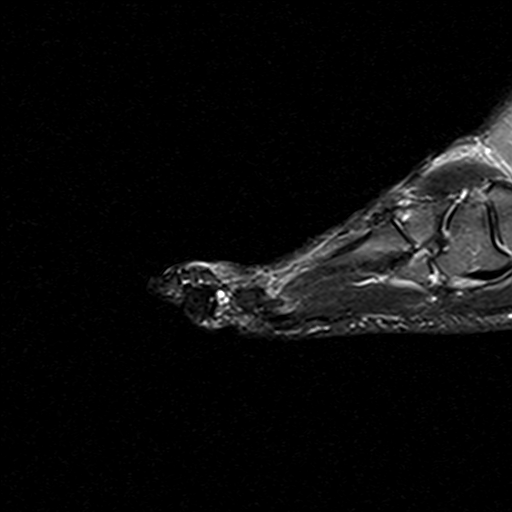

[36 of 40 positions shown; findings below may reference images not displayed]

FINDINGS: Bones/Joint/Cartilage

Hallux valgus of the left foot. Lateral subluxation of the first
proximal phalanx relative to the metatarsal head.

No acute fracture or dislocation. Mild osteoarthritis of the first
MTP joint. Mild osteoarthritis of the second and third TMT joints.
No aggressive osseous lesion.

Ligaments

Collateral ligaments are intact.  Lisfranc ligament is intact.

Muscles and Tendons

Muscles are normal in signal. 18 x 15 x 14 mm T1 intermediate and T2
hypo intense soft tissue mass along the plantar aspect of the third
proximal phalanx abutting the flexor tendon most concerning for a
giant cell tumor of the tendon sheath.

Soft tissues

No fluid collection or hematoma.
IMPRESSION: 18 x 15 x 14 mm soft tissue mass along the plantar aspect of the
third proximal phalanx abutting the flexor tendon most concerning
for a giant cell tumor of the tendon sheath.

## 2018-02-22 ENCOUNTER — Other Ambulatory Visit: Payer: Self-pay | Admitting: Primary Care

## 2018-02-22 DIAGNOSIS — F329 Major depressive disorder, single episode, unspecified: Secondary | ICD-10-CM

## 2018-02-22 DIAGNOSIS — F419 Anxiety disorder, unspecified: Principal | ICD-10-CM

## 2018-02-22 NOTE — Telephone Encounter (Signed)
Noted, will evaluate patient at upcoming appointment.

## 2018-02-22 NOTE — Telephone Encounter (Signed)
Ok to refill? Electronically refill request and both medications last prescribed on 02/27/2017. Last seen on 02/27/2017. Next appointment on 03/19/2018

## 2018-03-19 ENCOUNTER — Encounter: Payer: Self-pay | Admitting: Primary Care

## 2018-03-19 ENCOUNTER — Ambulatory Visit (INDEPENDENT_AMBULATORY_CARE_PROVIDER_SITE_OTHER): Payer: Medicare Other | Admitting: Primary Care

## 2018-03-19 VITALS — BP 122/68 | HR 67 | Temp 98.2°F | Ht 60.75 in | Wt 128.0 lb

## 2018-03-19 DIAGNOSIS — E2839 Other primary ovarian failure: Secondary | ICD-10-CM | POA: Diagnosis not present

## 2018-03-19 DIAGNOSIS — R739 Hyperglycemia, unspecified: Secondary | ICD-10-CM | POA: Diagnosis not present

## 2018-03-19 DIAGNOSIS — E785 Hyperlipidemia, unspecified: Secondary | ICD-10-CM

## 2018-03-19 DIAGNOSIS — M25561 Pain in right knee: Secondary | ICD-10-CM | POA: Diagnosis not present

## 2018-03-19 DIAGNOSIS — G8929 Other chronic pain: Secondary | ICD-10-CM | POA: Diagnosis not present

## 2018-03-19 DIAGNOSIS — Z0000 Encounter for general adult medical examination without abnormal findings: Secondary | ICD-10-CM

## 2018-03-19 DIAGNOSIS — F329 Major depressive disorder, single episode, unspecified: Secondary | ICD-10-CM

## 2018-03-19 DIAGNOSIS — F419 Anxiety disorder, unspecified: Secondary | ICD-10-CM

## 2018-03-19 DIAGNOSIS — M25562 Pain in left knee: Secondary | ICD-10-CM | POA: Diagnosis not present

## 2018-03-19 MED ORDER — CLONAZEPAM 1 MG PO TABS: 1.0000 mg | ORAL_TABLET | ORAL | 0 refills | Status: DC | PRN

## 2018-03-19 NOTE — Assessment & Plan Note (Signed)
Declines pneumonia and shingles vaccinations. Declines mammogram. Recommended bone density scan for which she agrees, orders placed. Commended her on regular activity, encouraged to eat more vegetables, fruit, whole grains.  Exam unremarkable. Labs pending. Follow up in 1 year for CPE.

## 2018-03-19 NOTE — Progress Notes (Signed)
Subjective:    Patient ID: Joanna Reed, female    DOB: 12/24/1940, 77 y.o.   MRN: [id number]  HPI  Joanna Reed is a 77 year old female who presents today for complete physical.  She's struggling with bilateral knee pain which has been chronic for years. She denies pain/stiffness with ambulation or rest, only experiences knee pain when climbing stairs. Her pain is to the point where she can no longer visit the upstairs of her home. She'd like to seen an orthopedist to start and prefers to bypass physical therapy. She mentions that a friend of hers has undergone surgical intervention with significant improvement.   Immunizations: -Pneumonia: Declines -Shingles: Declines   Diet: She endorses a fair diet. Breakfast: Frozen breakfast bowls, cereal, fruit Lunch: Skips mostly Dinner: Frozen dinners, sandwiches, restaurants mostly Snacks: Occasional protein bar Desserts: Pie, nightly  Beverages: Lemonade, coffee, water  Exercise: She is exercising with videos 3-4 days weekly for 60 minutes.  Eye exam: Due this Summer Dental exam: Completes semi-annually  Dexa: Completed in 2016, due again. Mammogram: Declines   Review of Systems  Constitutional: Negative for unexpected weight change.  HENT: Negative for rhinorrhea.   Respiratory: Negative for cough and shortness of breath.   Cardiovascular: Negative for chest pain.  Gastrointestinal: Negative for constipation and diarrhea.  Genitourinary: Negative for difficulty urinating and menstrual problem.  Musculoskeletal: Positive for arthralgias. Negative for myalgias.       Chronic bilateral knee pain  Skin: Negative for rash.  Allergic/Immunologic: Negative for environmental allergies.  Neurological: Negative for dizziness, numbness and headaches.  Psychiatric/Behavioral:       Feels well managed on current regimen       Past Medical History:  Diagnosis Date  . Anxiety and depression      Social History   Socioeconomic  History  . Marital status: Divorced    Spouse name: Not on file  . Number of children: Not on file  . Years of education: Not on file  . Highest education level: Not on file  Occupational History  . Not on file  Social Needs  . Financial resource strain: Not on file  . Food insecurity:    Worry: Not on file    Inability: Not on file  . Transportation needs:    Medical: Not on file    Non-medical: Not on file  Tobacco Use  . Smoking status: Never Smoker  . Smokeless tobacco: Never Used  Substance and Sexual Activity  . Alcohol use: Yes    Alcohol/week: 0.0 oz    Comment: occ  . Drug use: No  . Sexual activity: Not on file  Lifestyle  . Physical activity:    Days per week: Not on file    Minutes per session: Not on file  . Stress: Not on file  Relationships  . Social connections:    Talks on phone: Not on file    Gets together: Not on file    Attends religious service: Not on file    Active member of club or organization: Not on file    Attends meetings of clubs or organizations: Not on file    Relationship status: Not on file  . Intimate partner violence:    Fear of current or ex partner: Not on file    Emotionally abused: Not on file    Physically abused: Not on file    Forced sexual activity: Not on file  Other Topics Concern  . Not on file  Social History Narrative   Enjoys traveling   Daughter lives in [place] with her husband   Has 2 grandchildren in [place] (boys, 2,4)    Past Surgical History:  Procedure Laterality Date  . ABDOMINAL HYSTERECTOMY     1980    Family History  Problem Relation Age of Onset  . Arthritis Mother   . Heart disease Father        Deceased from MI  . Stroke Maternal Grandmother 85  . Arthritis Maternal Grandfather     Allergies  Allergen Reactions  . Sulfa Antibiotics     Current Outpatient Medications on File Prior to Visit  Medication Sig Dispense Refill  . buPROPion (WELLBUTRIN XL) 300 MG 24 hr tablet TAKE 1  TABLET BY MOUTH EVERY DAY 90 tablet 0  . citalopram (CELEXA) 20 MG tablet TAKE 1 TABLET BY MOUTH EVERY DAY 90 tablet 0   No current facility-administered medications on file prior to visit.     BP 122/68   Pulse 67   Temp 98.2 F (36.8 C) (Oral)   Ht 5' 0.75" (1.543 m)   Wt 128 lb (58.1 kg)   SpO2 97%   BMI 24.38 kg/m    Objective:   Physical Exam  Constitutional: She is oriented to person, place, and time. She appears well-nourished.  HENT:  Mouth/Throat: No oropharyngeal exudate.  Eyes: Pupils are equal, round, and reactive to light. EOM are normal.  Neck: Neck supple. No thyromegaly present.  Cardiovascular: Normal rate and regular rhythm.  Respiratory: Effort normal and breath sounds normal.  GI: Soft. Bowel sounds are normal. There is no tenderness.  Musculoskeletal: Normal range of motion.  Neurological: She is alert and oriented to person, place, and time.  Skin: Skin is warm and dry.  Psychiatric: She has a normal mood and affect.           Assessment & Plan:

## 2018-03-19 NOTE — Assessment & Plan Note (Signed)
Continues to progress and now at the point where she's no longer to climb stairs in her home. Referral placed to orthopedics for further evaluation as she kindly declines physical therapy and further work up in our office.

## 2018-03-19 NOTE — Patient Instructions (Signed)
Call the [facility] to schedule your bone density scan.  Continue exercising. You should be getting 150 minutes of moderate intensity exercise weekly.  Make sure to eat plenty of vegetables, fruit, whole grains, lean protein.  Ensure you are consuming 64 ounces of water daily.  You will be contacted regarding your referral to orthopedics.  Please let us know if you have not been contacted within one week.   Schedule a lab only appointment to return for your labs.  Follow up in 1 year for your annual exam or sooner if needed.  It was a pleasure to see you today!   Preventive Care 40 Years and Older, Female Preventive care refers to lifestyle choices and visits with your health care provider that can promote health and wellness. What does preventive care include?  A yearly physical exam. This is also called an annual well check.  Dental exams once or twice a year.  Routine eye exams. Ask your health care provider how often you should have your eyes checked.  Personal lifestyle choices, including: ? Daily care of your teeth and gums. ? Regular physical activity. ? Eating a healthy diet. ? Avoiding tobacco and drug use. ? Limiting alcohol use. ? Practicing safe sex. ? Taking low-dose aspirin every day. ? Taking vitamin and mineral supplements as recommended by your health care provider. What happens during an annual well check? The services and screenings done by your health care provider during your annual well check will depend on your age, overall health, lifestyle risk factors, and family history of disease. Counseling Your health care provider may ask you questions about your:  Alcohol use.  Tobacco use.  Drug use.  Emotional well-being.  Home and relationship well-being.  Sexual activity.  Eating habits.  History of falls.  Memory and ability to understand (cognition).  Work and work Statistician.  Reproductive health.  Screening You may  have the following tests or measurements:  Height, weight, and BMI.  Blood pressure.  Lipid and cholesterol levels. These may be checked every 5 years, or more frequently if you are over 80 years old.  Skin check.  Lung cancer screening. You may have this screening every year starting at age 70 if you have a 30-pack-year history of smoking and currently smoke or have quit within the past 15 years.  Fecal occult blood test (FOBT) of the stool. You may have this test every year starting at age 24.  Flexible sigmoidoscopy or colonoscopy. You may have a sigmoidoscopy every 5 years or a colonoscopy every 10 years starting at age 50.  Hepatitis C blood test.  Hepatitis B blood test.  Sexually transmitted disease (STD) testing.  Diabetes screening. This is done by checking your blood sugar (glucose) after you have not eaten for a while (fasting). You may have this done every 1-3 years.  Bone density scan. This is done to screen for osteoporosis. You may have this done starting at age 34.  Mammogram. This may be done every 1-2 years. Talk to your health care provider about how often you should have regular mammograms.  Talk with your health care provider about your test results, treatment options, and if necessary, the need for more tests. Vaccines Your health care provider may recommend certain vaccines, such as:  Influenza vaccine. This is recommended every year.  Tetanus, diphtheria, and acellular pertussis (Tdap, Td) vaccine. You may need a Td booster every 10 years.  Varicella vaccine. You may need this if you have  not been vaccinated.  Zoster vaccine. You may need this after age 55.  Measles, mumps, and rubella (MMR) vaccine. You may need at least one dose of MMR if you were born in 1957 or later. You may also need a second dose.  Pneumococcal 13-valent conjugate (PCV13) vaccine. One dose is recommended after age 30.  Pneumococcal polysaccharide (PPSV23) vaccine. One dose is  recommended after age 11.  Meningococcal vaccine. You may need this if you have certain conditions.  Hepatitis A vaccine. You may need this if you have certain conditions or if you travel or work in places where you may be exposed to hepatitis A.  Hepatitis B vaccine. You may need this if you have certain conditions or if you travel or work in places where you may be exposed to hepatitis B.  Haemophilus influenzae type b (Hib) vaccine. You may need this if you have certain conditions.  Talk to your health care provider about which screenings and vaccines you need and how often you need them. This information is not intended to replace advice given to you by your health care provider. Make sure you discuss any questions you have with your health care provider. Document Released: 11/05/2015 Document Revised: 06/28/2016 Document Reviewed: 08/10/2015 Elsevier Interactive Patient Education  [employer].

## 2018-03-19 NOTE — Assessment & Plan Note (Signed)
Doing well on current regimen of Wellbutrin and Celexa, refills provided in early May. Using Clonazepam sparingly, 30 tablets last for one year. Refill provided today.

## 2018-04-10 ENCOUNTER — Other Ambulatory Visit (INDEPENDENT_AMBULATORY_CARE_PROVIDER_SITE_OTHER): Payer: Medicare Other

## 2018-04-10 ENCOUNTER — Encounter: Payer: Self-pay | Admitting: *Deleted

## 2018-04-10 DIAGNOSIS — R739 Hyperglycemia, unspecified: Secondary | ICD-10-CM

## 2018-04-10 DIAGNOSIS — E785 Hyperlipidemia, unspecified: Secondary | ICD-10-CM | POA: Diagnosis not present

## 2018-04-10 LAB — COMPREHENSIVE METABOLIC PANEL
ALT: 10 U/L (ref 0–35)
AST: 13 U/L (ref 0–37)
Albumin: 4.3 g/dL (ref 3.5–5.2)
Alkaline Phosphatase: 56 U/L (ref 39–117)
BUN: 16 mg/dL (ref 6–23)
CALCIUM: 9.2 mg/dL (ref 8.4–10.5)
CHLORIDE: 105 meq/L (ref 96–112)
CO2: 28 mEq/L (ref 19–32)
Creatinine, Ser: 0.81 mg/dL (ref 0.40–1.20)
GFR: 72.74 mL/min (ref >60.00)
Glucose, Bld: 103 mg/dL — ABNORMAL HIGH (ref 70–99)
POTASSIUM: 4.2 meq/L (ref 3.5–5.1)
SODIUM: 140 meq/L (ref 135–145)
Total Bilirubin: 0.4 mg/dL (ref 0.2–1.2)
Total Protein: 6.9 g/dL (ref 6.0–8.3)

## 2018-04-10 LAB — LIPID PANEL
CHOLESTEROL: 208 mg/dL — AB (ref 0–200)
HDL: 66.2 mg/dL (ref >39.00)
LDL CALC: 124 mg/dL — AB (ref 0–99)
NonHDL: 142.16
Total CHOL/HDL Ratio: 3
Triglycerides: 91 mg/dL (ref 0.0–149.0)
VLDL: 18.2 mg/dL (ref 0.0–40.0)

## 2018-04-10 LAB — HEMOGLOBIN A1C: Hgb A1c MFr Bld: 5.5 % (ref 4.6–6.5)

## 2018-05-18 ENCOUNTER — Other Ambulatory Visit: Payer: Self-pay | Admitting: Primary Care

## 2018-05-18 DIAGNOSIS — F32A Depression, unspecified: Secondary | ICD-10-CM

## 2018-05-18 DIAGNOSIS — F419 Anxiety disorder, unspecified: Principal | ICD-10-CM

## 2018-05-18 DIAGNOSIS — F329 Major depressive disorder, single episode, unspecified: Secondary | ICD-10-CM

## 2018-05-29 ENCOUNTER — Telehealth: Payer: Self-pay | Admitting: Primary Care

## 2018-05-29 NOTE — Telephone Encounter (Signed)
Okay to cancel

## 2018-05-29 NOTE — Telephone Encounter (Signed)
Follow up with pt regarding bone density you ordered in may.  She stated she didn't want to have this done.  She said she had one 2 years ago and did want to be exposed to any more radiation than necessary and she want talk to you about this at her next appointment Is it ok to cancel?

## 2018-09-11 ENCOUNTER — Other Ambulatory Visit: Payer: Self-pay | Admitting: Primary Care

## 2018-09-11 DIAGNOSIS — F419 Anxiety disorder, unspecified: Principal | ICD-10-CM

## 2018-09-11 DIAGNOSIS — F329 Major depressive disorder, single episode, unspecified: Secondary | ICD-10-CM

## 2018-09-11 NOTE — Telephone Encounter (Signed)
Name of Medication:  clonazepam (KLONOPIN) 1 MG tablet  [facility]  Last Fill or Written Date and Quantity: 03/19/2018  #30  Last Office Visit and Type: 03/19/2018 CPE  Next Office Visit and Type:   Last Controlled Substance Agreement Date: 01/19/2015  Last UDS: 01/19/2015

## 2018-09-12 ENCOUNTER — Other Ambulatory Visit: Payer: Self-pay | Admitting: Primary Care

## 2018-09-12 ENCOUNTER — Telehealth: Payer: Self-pay | Admitting: Primary Care

## 2018-09-12 DIAGNOSIS — F419 Anxiety disorder, unspecified: Principal | ICD-10-CM

## 2018-09-12 DIAGNOSIS — F329 Major depressive disorder, single episode, unspecified: Secondary | ICD-10-CM

## 2018-09-12 NOTE — Telephone Encounter (Signed)
Pt stated to call her cell and let her know if you will fill her medication because she has to leave home now [phone]

## 2018-09-12 NOTE — Telephone Encounter (Signed)
Last office visit 03/19/2018 for CPE.  Last refilled 03/19/2018 for #30 with no refills.  No future appointments.  UDS/Contract 01/19/2015.

## 2018-09-12 NOTE — Telephone Encounter (Signed)
Please notify patient that I gave her 30 tablets and May 2019, has she completely used up that bottle? She tells me during her visits that 30 tablets last for 1 year.

## 2018-09-12 NOTE — Telephone Encounter (Signed)
Refill declined.  We refilled this in May 2019, patient uses 1 bottle over the entire year.  Will refill in May 2020 if needed.

## 2018-09-12 NOTE — Telephone Encounter (Signed)
See phone note from 2:20 pm.

## 2018-09-12 NOTE — Telephone Encounter (Signed)
Pt stated she need this medication because she is going on a long trip to [place] and the plane ride is about 12hrs and she need to sleep

## 2018-09-13 MED ORDER — HYDROXYZINE HCL 25 MG PO TABS: 25.0000 mg | ORAL_TABLET | Freq: Every evening | ORAL | 0 refills | Status: DC | PRN

## 2018-09-13 NOTE — Telephone Encounter (Signed)
I will provide her with a total of 6 tablets to use for her flights to [place]. No other refills will be provided until her next physical in 2020. Please remind her that she tells me every year that one bottle lasts for one entire year, if she picked up her prescription in June 2019 then she should have plenty in her current bottle to last. Is she out of pills?  Please let me know once you've spoken with her and that she'll be provided with 6 tablets only until her CPE in 2020. Does she have any pills in her current medication bottle?

## 2018-09-13 NOTE — Telephone Encounter (Signed)
Spoken and notified patient of Joanna Reed comments. Patient stated that she does not remember picking up the bottle in June.  Patient stated that we are making her feel like a drug addict. She has about 7 tablet left. She stated that plan on going to [place] in March or April. Patient stated that she would like to speak to Anda Kraft to settle this. She cannot come in the office because she is 77 years old and hard to move around.

## 2018-09-13 NOTE — Telephone Encounter (Signed)
Tried to call patient at mobile number but voicemail box is not set up.

## 2018-09-13 NOTE — Telephone Encounter (Signed)
Spoke with patient regarding concerns, she has gone through a lot with a friends death and the death of her 78 year old cat and that she may have taken more tablets than usual. I offered my condolences for her loss.   We discussed that I do not support daily benzo use and that I will not provide her with refills until she needs it for her flight. We discussed the potential side effects of daily benzo use and she verbalized understanding and agreed that benzo use is not appropriate.   She would like something to help her fall asleep. I recommended hydroxyzine for sleep and anxiety with drowsiness precautions, she agrees to try. Rx for hydroxyzine 25 mg sent to pharmacy, she will update.   For now we will not be refilling her clonazepam.

## 2018-09-13 NOTE — Telephone Encounter (Signed)
Patient call back and she stated that she did not think she pick up the Rx. The [facility] stated that [facility] told [facility] that [facility]. I have inform patient that we did send Rx on 03/19/2018. I told patient that I will check [facility] first and let her know.  When I call [facility], I was told they did received the Rx on 03/19/2018 and someone pick up the Rx on 03/23/2018.  Please advise. Patient is upset and wants to speak to Allie Bossier. She stated that she needs this for her flight so she can go see her grandchildren.

## 2018-10-07 ENCOUNTER — Other Ambulatory Visit: Payer: Self-pay | Admitting: Primary Care

## 2018-10-07 DIAGNOSIS — F419 Anxiety disorder, unspecified: Principal | ICD-10-CM

## 2018-10-07 DIAGNOSIS — F329 Major depressive disorder, single episode, unspecified: Secondary | ICD-10-CM

## 2018-10-08 NOTE — Telephone Encounter (Signed)
Ok to change? Last prescribed on 09/13/2018  Last office visit on 03/19/2018

## 2018-10-08 NOTE — Telephone Encounter (Signed)
Noted.  Refill sent to pharmacy. 

## 2018-12-04 ENCOUNTER — Other Ambulatory Visit: Payer: Self-pay | Admitting: Primary Care

## 2018-12-04 DIAGNOSIS — F419 Anxiety disorder, unspecified: Principal | ICD-10-CM

## 2018-12-04 DIAGNOSIS — F32A Depression, unspecified: Secondary | ICD-10-CM

## 2018-12-04 DIAGNOSIS — F329 Major depressive disorder, single episode, unspecified: Secondary | ICD-10-CM

## 2018-12-10 ENCOUNTER — Other Ambulatory Visit: Payer: Self-pay | Admitting: Primary Care

## 2018-12-10 DIAGNOSIS — F419 Anxiety disorder, unspecified: Principal | ICD-10-CM

## 2018-12-10 DIAGNOSIS — F329 Major depressive disorder, single episode, unspecified: Secondary | ICD-10-CM

## 2019-06-08 ENCOUNTER — Other Ambulatory Visit: Payer: Self-pay | Admitting: Primary Care

## 2019-06-08 DIAGNOSIS — F329 Major depressive disorder, single episode, unspecified: Secondary | ICD-10-CM

## 2019-06-08 DIAGNOSIS — F32A Depression, unspecified: Secondary | ICD-10-CM

## 2019-06-10 NOTE — Telephone Encounter (Signed)
Patient needs follow-up office visit, can be virtual or in person.  Please schedule.  We will provide 30-day supply until she is seen.

## 2019-06-10 NOTE — Telephone Encounter (Signed)
Last prescribed on 12/04/2018 . Last appointment on 03/19/2018. No future appointment

## 2019-06-12 NOTE — Telephone Encounter (Signed)
Spoken and appointment has been scheduled.

## 2019-07-04 ENCOUNTER — Other Ambulatory Visit: Payer: Self-pay | Admitting: Primary Care

## 2019-07-04 DIAGNOSIS — F419 Anxiety disorder, unspecified: Secondary | ICD-10-CM

## 2019-07-04 DIAGNOSIS — F329 Major depressive disorder, single episode, unspecified: Secondary | ICD-10-CM

## 2019-07-08 ENCOUNTER — Other Ambulatory Visit: Payer: Self-pay | Admitting: Primary Care

## 2019-07-08 DIAGNOSIS — F32A Depression, unspecified: Secondary | ICD-10-CM

## 2019-07-08 DIAGNOSIS — F329 Major depressive disorder, single episode, unspecified: Secondary | ICD-10-CM

## 2019-07-09 ENCOUNTER — Other Ambulatory Visit: Payer: Self-pay

## 2019-07-09 ENCOUNTER — Encounter: Payer: Self-pay | Admitting: Primary Care

## 2019-07-09 ENCOUNTER — Ambulatory Visit (INDEPENDENT_AMBULATORY_CARE_PROVIDER_SITE_OTHER): Payer: Medicare Other | Admitting: Primary Care

## 2019-07-09 VITALS — BP 120/86 | HR 78 | Temp 97.8°F | Ht 60.75 in | Wt 126.0 lb

## 2019-07-09 DIAGNOSIS — M25511 Pain in right shoulder: Secondary | ICD-10-CM

## 2019-07-09 DIAGNOSIS — F419 Anxiety disorder, unspecified: Secondary | ICD-10-CM | POA: Diagnosis not present

## 2019-07-09 DIAGNOSIS — Z0000 Encounter for general adult medical examination without abnormal findings: Secondary | ICD-10-CM | POA: Diagnosis not present

## 2019-07-09 DIAGNOSIS — F329 Major depressive disorder, single episode, unspecified: Secondary | ICD-10-CM | POA: Diagnosis not present

## 2019-07-09 NOTE — Patient Instructions (Signed)
Stop by the lab prior to leaving today. I will notify you of your results once received.   Continue exercising. You should be getting 150 minutes of exercise weekly.  Continue to work on a healthy diet.  Ensure you are consuming 64 ounces of water daily.  I recommend you try naproxen 220 mg twice daily for about one week for shoulder/arm pain.  You may also see our sports medicine doctor Dr Lorelei Pont if your pain persists.   You can try Melatonin 3 mg as needed for sleep. You can take a maximum of 10 mg in 24 hours.  Stop by the lab prior to leaving today. I will notify you of your results once received.   It was a pleasure to see you today!   Preventive Care 25 Years and Older, Female Preventive care refers to lifestyle choices and visits with your health care provider that can promote health and wellness. This includes:  A yearly physical exam. This is also called an annual well check.  Regular dental and eye exams.  Immunizations.  Screening for certain conditions.  Healthy lifestyle choices, such as diet and exercise. What can I expect for my preventive care visit? Physical exam Your health care provider will check:  Height and weight. These may be used to calculate body mass index (BMI), which is a measurement that tells if you are at a healthy weight.  Heart rate and blood pressure.  Your skin for abnormal spots. Counseling Your health care provider may ask you questions about:  Alcohol, tobacco, and drug use.  Emotional well-being.  Home and relationship well-being.  Sexual activity.  Eating habits.  History of falls.  Memory and ability to understand (cognition).  Work and work Statistician.  Pregnancy and menstrual history. What immunizations do I need?  Influenza (flu) vaccine  This is recommended every year. Tetanus, diphtheria, and pertussis (Tdap) vaccine  You may need a Td booster every 10 years. Varicella (chickenpox) vaccine  You may  need this vaccine if you have not already been vaccinated. Zoster (shingles) vaccine  You may need this after age 29. Pneumococcal conjugate (PCV13) vaccine  One dose is recommended after age 85. Pneumococcal polysaccharide (PPSV23) vaccine  One dose is recommended after age 3. Measles, mumps, and rubella (MMR) vaccine  You may need at least one dose of MMR if you were born in 1957 or later. You may also need a second dose. Meningococcal conjugate (MenACWY) vaccine  You may need this if you have certain conditions. Hepatitis A vaccine  You may need this if you have certain conditions or if you travel or work in places where you may be exposed to hepatitis A. Hepatitis B vaccine  You may need this if you have certain conditions or if you travel or work in places where you may be exposed to hepatitis B. Haemophilus influenzae type b (Hib) vaccine  You may need this if you have certain conditions. You may receive vaccines as individual doses or as more than one vaccine together in one shot (combination vaccines). Talk with your health care provider about the risks and benefits of combination vaccines. What tests do I need? Blood tests  Lipid and cholesterol levels. These may be checked every 5 years, or more frequently depending on your overall health.  Hepatitis C test.  Hepatitis B test. Screening  Lung cancer screening. You may have this screening every year starting at age 42 if you have a 30-pack-year history of smoking and currently smoke  or have quit within the past 15 years.  Colorectal cancer screening. All adults should have this screening starting at age 19 and continuing until age 38. Your health care provider may recommend screening at age 60 if you are at increased risk. You will have tests every 1-10 years, depending on your results and the type of screening test.  Diabetes screening. This is done by checking your blood sugar (glucose) after you have not eaten for  a while (fasting). You may have this done every 1-3 years.  Mammogram. This may be done every 1-2 years. Talk with your health care provider about how often you should have regular mammograms.  BRCA-related cancer screening. This may be done if you have a family history of breast, ovarian, tubal, or peritoneal cancers. Other tests  Sexually transmitted disease (STD) testing.  Bone density scan. This is done to screen for osteoporosis. You may have this done starting at age 49. Follow these instructions at home: Eating and drinking  Eat a diet that includes fresh fruits and vegetables, whole grains, lean protein, and low-fat dairy products. Limit your intake of foods with high amounts of sugar, saturated fats, and salt.  Take vitamin and mineral supplements as recommended by your health care provider.  Do not drink alcohol if your health care provider tells you not to drink.  If you drink alcohol: ? Limit how much you have to 0-1 drink a day. ? Be aware of how much alcohol is in your drink. In the [place] one drink equals one 12 oz bottle of beer (355 mL), one 5 oz glass of wine (148 mL), or one 1 oz glass of hard liquor (44 mL). Lifestyle  Take daily care of your teeth and gums.  Stay active. Exercise for at least 30 minutes on 5 or more days each week.  Do not use any products that contain nicotine or tobacco, such as cigarettes, e-cigarettes, and chewing tobacco. If you need help quitting, ask your health care provider.  If you are sexually active, practice safe sex. Use a condom or other form of protection in order to prevent STIs (sexually transmitted infections).  Talk with your health care provider about taking a low-dose aspirin or statin. What's next?  Go to your health care provider once a year for a well check visit.  Ask your health care provider how often you should have your eyes and teeth checked.  Stay up to date on all vaccines. This information is not intended  to replace advice given to you by your health care provider. Make sure you discuss any questions you have with your health care provider. Document Released: 11/05/2015 Document Revised: 10/03/2018 Document Reviewed: 10/03/2018 Elsevier Patient Education  2020 [employer].

## 2019-07-09 NOTE — Assessment & Plan Note (Signed)
Declines all immunizations despite recommendations.  Declines mammogram and colon cancer screening due to age. Declines bone density scan. Encouraged regular exercise, healthy diet. Exam today as mentioned. Labs pending. Follow up in 1 year for CPE.

## 2019-07-09 NOTE — Assessment & Plan Note (Signed)
Doing well on current doses of Wellbutrin and Celexa. Refills sent to pharmacy. Continue same.

## 2019-07-09 NOTE — Assessment & Plan Note (Signed)
Located to right shoulder x 2-3 weeks. Exam today with decrease in ROM due to discomfort. Strength intact.  Suspect bursitis given exam and HPI. Recommended a round of naproxen BID x 7 days. She will update.

## 2019-07-09 NOTE — Progress Notes (Signed)
Subjective:    Patient ID: Joanna Reed, female    DOB: 01/04/1941, 78 y.o.   MRN: [id number]  HPI  Joanna Reed is a 78 year old female who presents today for follow-up.  Immunizations: -Influenza: Declines -Pneumonia: Declines -Shingles: Declines   Diet: She endorses a healthy diet and is eating prepared meals that include lean protein, vegetables, fruit, whole grains. Desserts infrequently. Drinking mostly water, coffee Exercise: She is not exercising as of now. She does participate in some exercise video via TV at times.  Eye exam: Completed in 2019 Dental exam: Completes semi-annually Colonoscopy: Completed about 10 years ago. Mammogram: No recent exam, declines due to age Dexa: No recent exam. Declines   Review of Systems  Constitutional: Negative for unexpected weight change.  HENT: Negative for rhinorrhea.   Respiratory: Negative for cough and shortness of breath.   Cardiovascular: Negative for chest pain.  Gastrointestinal: Negative for constipation and diarrhea.  Genitourinary: Negative for difficulty urinating.  Musculoskeletal: Positive for arthralgias.       Mild arthralgias to lower back intermittently. Right shoulder pain for the last 2-3 weeks with decrease in ROM due to pain. She's not taken anything OTC for her symptoms.   Skin: Negative for rash.  Allergic/Immunologic: Negative for environmental allergies.  Neurological: Negative for dizziness, numbness and headaches.  Psychiatric/Behavioral: Negative for suicidal ideas.       Doing well on Wellbutrin and Celexa.       Past Medical History:  Diagnosis Date  . Anxiety and depression      Social History   Socioeconomic History  . Marital status: Divorced    Spouse name: Not on file  . Number of children: Not on file  . Years of education: Not on file  . Highest education level: Not on file  Occupational History  . Not on file  Social Needs  . Financial resource strain: Not on file  .  Food insecurity    Worry: Not on file    Inability: Not on file  . Transportation needs    Medical: Not on file    Non-medical: Not on file  Tobacco Use  . Smoking status: Never Smoker  . Smokeless tobacco: Never Used  Substance and Sexual Activity  . Alcohol use: Yes    Alcohol/week: 0.0 standard drinks    Comment: occ  . Drug use: No  . Sexual activity: Not on file  Lifestyle  . Physical activity    Days per week: Not on file    Minutes per session: Not on file  . Stress: Not on file  Relationships  . Social Herbalist on phone: Not on file    Gets together: Not on file    Attends religious service: Not on file    Active member of club or organization: Not on file    Attends meetings of clubs or organizations: Not on file    Relationship status: Not on file  . Intimate partner violence    Fear of current or ex partner: Not on file    Emotionally abused: Not on file    Physically abused: Not on file    Forced sexual activity: Not on file  Other Topics Concern  . Not on file  Social History Narrative   Enjoys traveling   Daughter lives in [place] with her husband   Has 2 grandchildren in [place] (boys, 2,4)    Past Surgical History:  Procedure Laterality Date  . ABDOMINAL HYSTERECTOMY  1980    Family History  Problem Relation Age of Onset  . Arthritis Mother   . Heart disease Father        Deceased from MI  . Stroke Maternal Grandmother 85  . Arthritis Maternal Grandfather     Allergies  Allergen Reactions  . Sulfa Antibiotics     Current Outpatient Medications on File Prior to Visit  Medication Sig Dispense Refill  . buPROPion (WELLBUTRIN XL) 300 MG 24 hr tablet TAKE 1 TABLET BY MOUTH EVERY DAY 30 tablet 0  . citalopram (CELEXA) 20 MG tablet TAKE 1 TABLET BY MOUTH EVERY DAY 30 tablet 0  . hydrOXYzine (ATARAX/VISTARIL) 25 MG tablet TAKE 1 TABLET (25 MG TOTAL) BY MOUTH AT BEDTIME AS NEEDED FOR ANXIETY (SLEEP). 90 tablet 1   No current  facility-administered medications on file prior to visit.     BP 120/86   Pulse 78   Temp 97.8 F (36.6 C) (Temporal)   Ht 5' 0.75" (1.543 m)   Wt 126 lb (57.2 kg)   SpO2 97%   BMI 24.00 kg/m    Objective:   Physical Exam  Constitutional: She is oriented to person, place, and time. She appears well-nourished.  HENT:  Right Ear: Tympanic membrane and ear canal normal.  Left Ear: Tympanic membrane and ear canal normal.  Mouth/Throat: Oropharynx is clear and moist.  Eyes: Pupils are equal, round, and reactive to light. EOM are normal.  Neck: Neck supple.  Cardiovascular: Normal rate and regular rhythm.  Respiratory: Effort normal and breath sounds normal.  GI: Soft. Bowel sounds are normal. There is no abdominal tenderness.  Musculoskeletal: Normal range of motion.  Neurological: She is alert and oriented to person, place, and time.  Skin: Skin is warm and dry.  Psychiatric: She has a normal mood and affect.           Assessment & Plan:

## 2019-07-10 LAB — COMPREHENSIVE METABOLIC PANEL
ALT: 9 U/L (ref 0–35)
AST: 13 U/L (ref 0–37)
Albumin: 4.3 g/dL (ref 3.5–5.2)
Alkaline Phosphatase: 52 U/L (ref 39–117)
BUN: 17 mg/dL (ref 6–23)
CO2: 27 mEq/L (ref 19–32)
Calcium: 9.3 mg/dL (ref 8.4–10.5)
Chloride: 104 mEq/L (ref 96–112)
Creatinine, Ser: 0.88 mg/dL (ref 0.40–1.20)
GFR: 62 mL/min (ref >60.00)
Glucose, Bld: 91 mg/dL (ref 70–99)
Potassium: 3.8 mEq/L (ref 3.5–5.1)
Sodium: 139 mEq/L (ref 135–145)
Total Bilirubin: 0.4 mg/dL (ref 0.2–1.2)
Total Protein: 6.9 g/dL (ref 6.0–8.3)

## 2019-07-10 LAB — LIPID PANEL
Cholesterol: 241 mg/dL — ABNORMAL HIGH (ref 0–200)
HDL: 63.5 mg/dL (ref >39.00)
NonHDL: 177.9
Total CHOL/HDL Ratio: 4
Triglycerides: 247 mg/dL — ABNORMAL HIGH (ref 0.0–149.0)
VLDL: 49.4 mg/dL — ABNORMAL HIGH (ref 0.0–40.0)

## 2019-07-10 LAB — LDL CHOLESTEROL, DIRECT: Direct LDL: 153 mg/dL

## 2019-07-11 ENCOUNTER — Encounter: Payer: Self-pay | Admitting: *Deleted

## 2020-08-25 ENCOUNTER — Other Ambulatory Visit: Payer: Self-pay | Admitting: Primary Care

## 2020-08-25 DIAGNOSIS — F32A Depression, unspecified: Secondary | ICD-10-CM

## 2020-08-25 DIAGNOSIS — F419 Anxiety disorder, unspecified: Secondary | ICD-10-CM

## 2020-09-22 ENCOUNTER — Telehealth: Payer: Self-pay | Admitting: Primary Care

## 2020-09-22 DIAGNOSIS — F419 Anxiety disorder, unspecified: Secondary | ICD-10-CM

## 2020-09-22 NOTE — Telephone Encounter (Signed)
Alice, please schedule patient ASAP for follow up. We cannot grant refills after this month if she does not follow up.

## 2020-09-22 NOTE — Telephone Encounter (Signed)
Pharmacy requests refill on: Bupropion HCL XL 300 mg & Citalopram HBR 20 mg   LAST REFILL: 08/26/2020 LAST OV: 07/09/2019 NEXT OV: Not Scheduled  PHARMACY: [place], [place]

## 2020-09-23 NOTE — Telephone Encounter (Signed)
Called patient and scheduled her 1/12 and 1/13. Sending to PCP as FYI.

## 2020-09-23 NOTE — Telephone Encounter (Signed)
Noted  

## 2020-10-22 ENCOUNTER — Other Ambulatory Visit: Payer: Self-pay | Admitting: Primary Care

## 2020-10-22 DIAGNOSIS — F419 Anxiety disorder, unspecified: Secondary | ICD-10-CM

## 2020-11-03 ENCOUNTER — Telehealth: Payer: Self-pay

## 2020-11-03 ENCOUNTER — Ambulatory Visit: Payer: Medicare PPO

## 2020-11-03 ENCOUNTER — Other Ambulatory Visit: Payer: Self-pay

## 2020-11-03 NOTE — Telephone Encounter (Signed)
Noted.  Joellen, why was her citalopram and bupropion refilled for 90-day supply with 1 refill on 10/25/2020?  She has not been seen since September 2020 and apparently is refusing to come in.  [facility] and [facility] that extra [facility].  Please warn the patient that this is her final refill and that we will no longer fill until she is seen.  She can either come in as requested, or I will stop refilling her medication.  The 90-day supply should never have been sent in.  Larene [place], this is a recurrent issue with refills.  CMA's are refilling prescriptions (without my knowledge) for 6 and sometimes 12 months when patients have not been seen for 1+ years.

## 2020-11-03 NOTE — Telephone Encounter (Signed)
Noted and appreciate the assistance. Shirlee More,

## 2020-11-03 NOTE — Telephone Encounter (Signed)
[facility] the additional refills of the citalopram and bupropion. Attempted to call patient to inform her that she needed to reschedule her appointment with Allie Bossier, NP as soon as possible for further refills. Left voicemail for patient to return call. Leticia Penna, RN and Randall An, RN made aware of this as well.

## 2020-11-03 NOTE — Telephone Encounter (Signed)
FYI- Called patient to complete AWV. Patient states that she does not participate in these type of visits at all. She wanted this appointment cancelled along with her physical that was scheduled on 11/04/20 @ 11:20 am. Patient states that she will come in when she feels the need too and she usually takes care of everything face to face with Alma Friendly only. Respected patient's wishes and cancelled her appointments.

## 2020-11-04 ENCOUNTER — Encounter: Payer: Medicare Other | Admitting: Primary Care

## 2020-12-15 ENCOUNTER — Telehealth: Payer: Self-pay | Admitting: Primary Care

## 2020-12-15 NOTE — Telephone Encounter (Signed)
the daughter called from [place]  stated that you have to treat her with kid gloves, and sometimes she can be harsh but she doesn't mean it , and it could be Alzheimer , and she stated to agree with her. And wanted to organize home health

## 2020-12-16 ENCOUNTER — Ambulatory Visit: Payer: Medicare PPO | Admitting: Primary Care

## 2020-12-20 ENCOUNTER — Telehealth: Payer: Self-pay | Admitting: Primary Care

## 2020-12-20 NOTE — Telephone Encounter (Signed)
She stated that she can be emailed at  [email or web address] for the list of people to help with mom.

## 2020-12-21 NOTE — Telephone Encounter (Signed)
Spoke with PCP regarding patient and she will take next steps for attempting to connect with patient.

## 2020-12-21 NOTE — Telephone Encounter (Signed)
Joanna Reed, this patient is not doing well according to a local neighbor who is also a patient of mine. I need to contact this patient's daughter so that we can coordinate care, however, this patient has no one listed on her DPR.   Can I speak with the patient's daughter?  If so, then I need to communicate via outside email as she lives in [place] and our phone system will not reach.  I appreciate your assistance, I just want to make sure I am allowed to communicate before reaching out.

## 2020-12-21 NOTE — Telephone Encounter (Signed)
Spoke with patient's neighbor who reports that patient has been hallucinating, wandered off to the police station and was brought safely back to her home.  Discussed that I cannot provide medical information regarding the patient to anyone, she verbalized understanding.  We also discussed contacting Adult Protective Services given the concern for patient's wellbeing.  Patient's neighbor will reach back out to the daughter to see if she can come visit her mother.  She will call back with any further questions.

## 2020-12-28 NOTE — Telephone Encounter (Signed)
error 

## 2020-12-29 ENCOUNTER — Inpatient Hospital Stay
Admission: EM | Admit: 2020-12-29 | Discharge: 2021-01-04 | DRG: 689 | Disposition: A | Discharge status: Skilled Nursing Facility | Payer: Medicare PPO | Attending: Internal Medicine | Admitting: Internal Medicine

## 2020-12-29 ENCOUNTER — Other Ambulatory Visit: Payer: Self-pay

## 2020-12-29 ENCOUNTER — Emergency Department: Payer: Medicare PPO

## 2020-12-29 DIAGNOSIS — R739 Hyperglycemia, unspecified: Secondary | ICD-10-CM | POA: Diagnosis present

## 2020-12-29 DIAGNOSIS — N3000 Acute cystitis without hematuria: Secondary | ICD-10-CM | POA: Diagnosis not present

## 2020-12-29 DIAGNOSIS — G9341 Metabolic encephalopathy: Secondary | ICD-10-CM | POA: Diagnosis present

## 2020-12-29 DIAGNOSIS — N390 Urinary tract infection, site not specified: Principal | ICD-10-CM | POA: Diagnosis present

## 2020-12-29 DIAGNOSIS — F419 Anxiety disorder, unspecified: Secondary | ICD-10-CM | POA: Diagnosis not present

## 2020-12-29 DIAGNOSIS — Y92009 Unspecified place in unspecified non-institutional (private) residence as the place of occurrence of the external cause: Secondary | ICD-10-CM

## 2020-12-29 DIAGNOSIS — Z882 Allergy status to sulfonamides status: Secondary | ICD-10-CM

## 2020-12-29 DIAGNOSIS — F411 Generalized anxiety disorder: Secondary | ICD-10-CM

## 2020-12-29 DIAGNOSIS — F32A Depression, unspecified: Secondary | ICD-10-CM | POA: Diagnosis present

## 2020-12-29 DIAGNOSIS — L8990 Pressure ulcer of unspecified site, unspecified stage: Secondary | ICD-10-CM | POA: Insufficient documentation

## 2020-12-29 DIAGNOSIS — Z823 Family history of stroke: Secondary | ICD-10-CM

## 2020-12-29 DIAGNOSIS — W19XXXA Unspecified fall, initial encounter: Secondary | ICD-10-CM | POA: Diagnosis not present

## 2020-12-29 DIAGNOSIS — W1830XA Fall on same level, unspecified, initial encounter: Secondary | ICD-10-CM | POA: Diagnosis present

## 2020-12-29 DIAGNOSIS — R443 Hallucinations, unspecified: Secondary | ICD-10-CM | POA: Diagnosis present

## 2020-12-29 DIAGNOSIS — F0390 Unspecified dementia without behavioral disturbance: Secondary | ICD-10-CM

## 2020-12-29 DIAGNOSIS — Z20822 Contact with and (suspected) exposure to covid-19: Secondary | ICD-10-CM | POA: Diagnosis present

## 2020-12-29 DIAGNOSIS — Z8249 Family history of ischemic heart disease and other diseases of the circulatory system: Secondary | ICD-10-CM

## 2020-12-29 DIAGNOSIS — L89152 Pressure ulcer of sacral region, stage 2: Secondary | ICD-10-CM | POA: Diagnosis present

## 2020-12-29 DIAGNOSIS — F4322 Adjustment disorder with anxiety: Secondary | ICD-10-CM

## 2020-12-29 DIAGNOSIS — R4189 Other symptoms and signs involving cognitive functions and awareness: Secondary | ICD-10-CM

## 2020-12-29 DIAGNOSIS — R410 Disorientation, unspecified: Secondary | ICD-10-CM

## 2020-12-29 DIAGNOSIS — E876 Hypokalemia: Secondary | ICD-10-CM | POA: Diagnosis present

## 2020-12-29 DIAGNOSIS — F341 Dysthymic disorder: Secondary | ICD-10-CM

## 2020-12-29 DIAGNOSIS — Z8261 Family history of arthritis: Secondary | ICD-10-CM

## 2020-12-29 DIAGNOSIS — Z79899 Other long term (current) drug therapy: Secondary | ICD-10-CM

## 2020-12-29 LAB — CBC WITH DIFFERENTIAL/PLATELET
Abs Immature Granulocytes: 0.05 10*3/uL (ref 0.00–0.07)
Basophils Absolute: 0.1 10*3/uL (ref 0.0–0.1)
Basophils Relative: 1 %
Eosinophils Absolute: 0 10*3/uL (ref 0.0–0.5)
Eosinophils Relative: 0 %
HCT: 36.6 % (ref 36.0–46.0)
Hemoglobin: 12.1 g/dL (ref 12.0–15.0)
Immature Granulocytes: 1 %
Lymphocytes Relative: 14 %
Lymphs Abs: 1.6 10*3/uL (ref 0.7–4.0)
MCH: 29.2 pg (ref 26.0–34.0)
MCHC: 33.1 g/dL (ref 30.0–36.0)
MCV: 88.4 fL (ref 80.0–100.0)
Monocytes Absolute: 1 10*3/uL (ref 0.1–1.0)
Monocytes Relative: 9 %
Neutro Abs: 8.3 10*3/uL — ABNORMAL HIGH (ref 1.7–7.7)
Neutrophils Relative %: 75 %
Platelets: 268 10*3/uL (ref 150–400)
RBC: 4.14 MIL/uL (ref 3.87–5.11)
RDW: 12.3 % (ref 11.5–15.5)
WBC: 11 10*3/uL — ABNORMAL HIGH (ref 4.0–10.5)
nRBC: 0 % (ref 0.0–0.2)

## 2020-12-29 LAB — BRAIN NATRIURETIC PEPTIDE: B Natriuretic Peptide: 114.7 pg/mL — ABNORMAL HIGH (ref 0.0–100.0)

## 2020-12-29 LAB — COMPREHENSIVE METABOLIC PANEL
ALT: 20 U/L (ref 0–44)
AST: 28 U/L (ref 15–41)
Albumin: 3.8 g/dL (ref 3.5–5.0)
Alkaline Phosphatase: 38 U/L (ref 38–126)
Anion gap: 9 (ref 5–15)
BUN: 17 mg/dL (ref 8–23)
CO2: 24 mmol/L (ref 22–32)
Calcium: 9 mg/dL (ref 8.9–10.3)
Chloride: 108 mmol/L (ref 98–111)
Creatinine, Ser: 0.68 mg/dL (ref 0.44–1.00)
GFR, Estimated: >60 mL/min (ref >60)
Glucose, Bld: 89 mg/dL (ref 70–99)
Potassium: 3.5 mmol/L (ref 3.5–5.1)
Sodium: 141 mmol/L (ref 135–145)
Total Bilirubin: 1 mg/dL (ref 0.3–1.2)
Total Protein: 6.4 g/dL — ABNORMAL LOW (ref 6.5–8.1)

## 2020-12-29 LAB — URINALYSIS, COMPLETE (UACMP) WITH MICROSCOPIC
Bilirubin Urine: NEGATIVE
Glucose, UA: NEGATIVE mg/dL
Ketones, ur: 20 mg/dL — AB
Leukocytes,Ua: NEGATIVE
Nitrite: NEGATIVE
Protein, ur: NEGATIVE mg/dL
Specific Gravity, Urine: 1.018 (ref 1.005–1.030)
pH: 6 (ref 5.0–8.0)

## 2020-12-29 LAB — CK: Total CK: 509 U/L — ABNORMAL HIGH (ref 38–234)

## 2020-12-29 LAB — SARS CORONAVIRUS 2 (TAT 6-24 HRS): SARS Coronavirus 2: NEGATIVE

## 2020-12-29 LAB — TSH: TSH: 2.43 u[IU]/mL (ref 0.350–4.500)

## 2020-12-29 MED ORDER — SODIUM CHLORIDE 0.9 % IV SOLN
1.0000 g | INTRAVENOUS | Status: DC
  Administered 2020-12-30 – 2020-12-31 (×2): 1 g via INTRAVENOUS
  Filled 2020-12-29: qty 1
  Filled 2020-12-29: qty 10
  Filled 2020-12-29: qty 1

## 2020-12-29 MED ORDER — ACETAMINOPHEN 325 MG PO TABS: 650.0000 mg | ORAL_TABLET | Freq: Four times a day (QID) | ORAL | Status: DC | PRN

## 2020-12-29 MED ORDER — SODIUM CHLORIDE 0.9 % IV SOLN
1.0000 g | Freq: Once | INTRAVENOUS | Status: AC
  Administered 2020-12-29: 1 g via INTRAVENOUS
  Filled 2020-12-29: qty 10

## 2020-12-29 MED ORDER — ACETAMINOPHEN 650 MG RE SUPP: 650.0000 mg | Freq: Four times a day (QID) | RECTAL | Status: DC | PRN

## 2020-12-29 MED ORDER — HYDROXYZINE HCL 25 MG PO TABS
25.0000 mg | ORAL_TABLET | Freq: Every evening | ORAL | Status: DC | PRN
  Administered 2021-01-01 – 2021-01-03 (×3): 25 mg via ORAL
  Filled 2020-12-29 (×4): qty 1

## 2020-12-29 MED ORDER — BUPROPION HCL ER (XL) 300 MG PO TB24
300.0000 mg | ORAL_TABLET | Freq: Every day | ORAL | Status: DC
  Administered 2020-12-29 – 2021-01-04 (×7): 300 mg via ORAL
  Filled 2020-12-29 (×7): qty 2

## 2020-12-29 MED ORDER — SODIUM CHLORIDE 0.9 % IV SOLN: INTRAVENOUS | Status: DC

## 2020-12-29 MED ORDER — SODIUM CHLORIDE 0.9 % IV SOLN: Freq: Once | INTRAVENOUS | Status: AC

## 2020-12-29 MED ORDER — CITALOPRAM HYDROBROMIDE 20 MG PO TABS
20.0000 mg | ORAL_TABLET | Freq: Every day | ORAL | Status: DC
  Administered 2020-12-29 – 2021-01-04 (×7): 20 mg via ORAL
  Filled 2020-12-29 (×7): qty 1

## 2020-12-29 MED ORDER — SODIUM CHLORIDE 0.9 % IV BOLUS
250.0000 mL | Freq: Once | INTRAVENOUS | Status: AC
  Administered 2020-12-29: 250 mL via INTRAVENOUS

## 2020-12-29 MED ORDER — ENOXAPARIN SODIUM 40 MG/0.4ML ~~LOC~~ SOLN
40.0000 mg | SUBCUTANEOUS | Status: DC
  Administered 2020-12-30 – 2021-01-04 (×6): 40 mg via SUBCUTANEOUS
  Filled 2020-12-29 (×7): qty 0.4

## 2020-12-29 MED ORDER — HALOPERIDOL LACTATE 5 MG/ML IJ SOLN
1.0000 mg | Freq: Once | INTRAMUSCULAR | Status: AC
  Administered 2020-12-29: 1 mg via INTRAVENOUS
  Filled 2020-12-29: qty 1

## 2020-12-29 MED ORDER — ONDANSETRON HCL 4 MG PO TABS: 4.0000 mg | ORAL_TABLET | Freq: Four times a day (QID) | ORAL | Status: DC | PRN

## 2020-12-29 NOTE — ED Provider Notes (Signed)
[facility] Emergency Department Provider Note    Event Date/Time   First MD Initiated Contact with Patient 12/29/20 [phone]     (approximate)  I have reviewed the triage vital signs and the nursing notes.   HISTORY  Chief Complaint Fall    HPI Joanna Reed is a 80 y.o. female with no significant past medical history presents to the ER via EMS after being found down by a neighbor.  Poorly has been having increasing confusion lives at home alone.  Is been declining over the past few days.  Family lives out of town.  She denies any pain.  Does not remember falling.  Denies any chest pain.  No nausea or vomiting no dysuria.  Denies any weakness.  Not any blood thinners.    Past Medical History:  Diagnosis Date  . Anxiety and depression    Family History  Problem Relation Age of Onset  . Arthritis Mother   . Heart disease Father        Deceased from MI  . Stroke Maternal Grandmother 85  . Arthritis Maternal Grandfather    Past Surgical History:  Procedure Laterality Date  . ABDOMINAL HYSTERECTOMY     1980   Patient Active Problem List   Diagnosis Date Noted  . Preventative health care 03/19/2018  . Chronic knee pain 02/27/2017  . Medicare annual wellness visit, subsequent 01/28/2015  . Anxiety and depression 01/19/2015  . Acute shoulder pain 01/19/2015      Prior to Admission medications   Medication Sig Start Date End Date Taking? Authorizing Provider  buPROPion (WELLBUTRIN XL) 300 MG 24 hr tablet TAKE 1 TABLET BY MOUTH EVERY DAY 10/25/20   Pleas Koch, NP  citalopram (CELEXA) 20 MG tablet TAKE 1 TABLET BY MOUTH EVERY DAY 10/25/20   Pleas Koch, NP  hydrOXYzine (ATARAX/VISTARIL) 25 MG tablet TAKE 1 TABLET (25 MG TOTAL) BY MOUTH AT BEDTIME AS NEEDED FOR ANXIETY (SLEEP). 10/08/18   Pleas Koch, NP    Allergies Sulfa antibiotics    Social History Social History   Tobacco Use  . Smoking status: Never Smoker  .  Smokeless tobacco: Never Used  Substance Use Topics  . Alcohol use: Yes    Alcohol/week: 0.0 standard drinks    Comment: occ  . Drug use: No    Review of Systems Patient denies headaches, rhinorrhea, blurry vision, numbness, shortness of breath, chest pain, edema, cough, abdominal pain, nausea, vomiting, diarrhea, dysuria, fevers, rashes or hallucinations unless otherwise stated above in HPI. ____________________________________________   PHYSICAL EXAM:  VITAL SIGNS: Vitals:   12/29/20 0636  BP: (!) 159/77  Pulse: 88  Resp: 16  Temp: 98.2 F (36.8 C)  SpO2: 100%    Constitutional: Alert and oriented x 3 but very slow to answer the questions  Eyes: Conjunctivae are normal.  Head: Atraumatic. Nose: No congestion/rhinnorhea. Mouth/Throat: Mucous membranes are moist.   Neck: No stridor. Painless ROM.  Cardiovascular: Normal rate, regular rhythm. Grossly normal heart sounds.  Good peripheral circulation. Respiratory: Normal respiratory effort.  No retractions. Lungs CTAB. Gastrointestinal: Soft and nontender. No distention. No abdominal bruits. No CVA tenderness. Genitourinary:  Musculoskeletal: No lower extremity tenderness nor edema.  No joint effusions. Neurologic:  Normal speech and language.  Equal strength BUE.  No gross focal neurologic deficits are appreciated. No facial droop Skin:  Skin is warm, dry and intact. No rash noted. Psychiatric: Mood and affect are normal. Speech and behavior are normal.  ____________________________________________  LABS (all labs ordered are listed, but only abnormal results are displayed)  No results found for this or any previous visit (from the past 24 hour(s)). ____________________________________________  EKG My review and personal interpretation at Time: 6:46   Indication: weakness  Rate: 80  Rhythm: sinus Axis: normal Other: normal intervals, no stemi ____________________________________________  RADIOLOGY  I  personally reviewed all radiographic images ordered to evaluate for the above acute complaints and reviewed radiology reports and findings.  These findings were personally discussed with the patient.  Please see medical record for radiology report.  ____________________________________________   PROCEDURES  Procedure(s) performed:  Procedures    Critical Care performed: no ____________________________________________   INITIAL IMPRESSION / ASSESSMENT AND PLAN / ED COURSE  Pertinent labs & imaging results that were available during my care of the patient were reviewed by me and considered in my medical decision making (see chart for details).   DDX: Dehydration, sepsis, pna, uti, hypoglycemia, cva, drug effect, withdrawal, encephalitis   [name] is a 80 y.o. who presents to the ED with presentation as described above.  Patient frail but nontoxic-appearing afebrile hemodynamically stable.  Does not any focal deficits but she seems to be confused even though she is oriented x3 and able to answer questions it takes quite a while and not sure how reliable historian she is.  She is not complaining of any pain.  Urine does appear cloudy suspect UTI.  Blood will be sent for the above differential.  Patient be signed out to oncoming physician pending reassessment and follow-up on blood work and imaging.     The patient was evaluated in Emergency Department today for the symptoms described in the history of present illness. He/she was evaluated in the context of the global COVID-19 pandemic, which necessitated consideration that the patient might be at risk for infection with the SARS-CoV-2 virus that causes COVID-19. Institutional protocols and algorithms that pertain to the evaluation of patients at risk for COVID-19 are in a state of rapid change based on information released by regulatory bodies including the CDC and federal and state organizations. These policies and algorithms were  followed during the patient's care in the ED.  As part of my medical decision making, I reviewed the following data within the [facility] notes reviewed and incorporated, Labs reviewed, notes from prior ED visits and [place] Controlled Substance Database   ____________________________________________   FINAL CLINICAL IMPRESSION(S) / ED DIAGNOSES  Final diagnoses:  Fall, initial encounter  Confusion      NEW MEDICATIONS STARTED DURING THIS VISIT:  New Prescriptions   No medications on file     Note:  This document was prepared using Dragon voice recognition software and may include unintentional dictation errors.    Merlyn Lot, MD 12/29/20 [phone]

## 2020-12-29 NOTE — ED Triage Notes (Signed)
Pt to ED via EMS from home, per ems pt was found on ground by neighbor, pt does not remember falling, pt is at baseline a&ox4 with some confusion. Pt lives alone. Pt denies any pain

## 2020-12-29 NOTE — ED Notes (Signed)
Taken to CT.

## 2020-12-29 NOTE — ED Notes (Signed)
Informed RN bed assigned [id number] 

## 2020-12-29 NOTE — ED Notes (Signed)
Pt placed on fall alarm.

## 2020-12-29 NOTE — ED Notes (Signed)
Patient eating lunch tray at this time °

## 2020-12-29 NOTE — ED Provider Notes (Signed)
-----------------------------------------   9:45 AM on 12/29/2020 -----------------------------------------  Patient care assumed from Dr. Quentin Cornwall.  I have personally spoken to the patient she seems confused.  She is rambling on largely nonsensically at times.  I had a long discussion with the patient she is agreeable to stay in the hospital so that we can help take care of her.  Patient's labs are largely reassuring besides urinary tract infection showing 11-20 white cells and rare bacteria possibly indicating a mild urinary tract infection.  Patient receiving antibiotics.  Also receiving IV hydration.  Patient CK found to be elevated greater than 500 likely from being down for a prolonged time.  Patient lives alone.  We will admit to the hospitalist service for further work-up and treatment.  CT scans are negative for acute abnormality.   Harvest Dark, MD 12/29/20 [phone]

## 2020-12-29 NOTE — ED Notes (Signed)
Pt wet. Peri care, barrier cream applied and brief changed.  Pt calm and cooperative.

## 2020-12-29 NOTE — ED Notes (Signed)
Pt was wet with urine.  Pericare, brief, and pad change.  Barrier cream applied to red spot on left and right buttock.

## 2020-12-29 NOTE — ED Notes (Signed)
Pt resting in bed. States that she wants to go home. States "I was not on the floor because my neighbor didn't really see me". Pt's story seems confused and off but pt insists that she is fine and wants to go home.

## 2020-12-29 NOTE — H&P (Signed)
History and Physical    Joanna Reed:[id number] DOB: 02-18-1941 DOA: 12/29/2020  Referring MD/NP/PA:   PCP: Pleas Koch, NP   Patient coming from:  The patient is coming from home.    Chief Complaint: AMS and fall  HPI: Joanna Reed is a 80 y.o. female with medical history significant of depression with anxiety, who presents with altered mental status and fall.  Patient has AMS, and is unable to provide accurate medical history. I have tried to call her friend without success,  therefore, most of the history is obtained by discussing the case with ED physician, per EMS report, and with the nursing staff.  Per report, pt was found on ground by neighbor. Pt does not remember falling.  Not complaining of any pain.  Patient is confused. Per RN report, pt had friend who visited pt in hospital and told RN that pt has been hallucinating and acting differently in the past several months.  Patient's daughter who lives in [place] has arranged for patient to get evaluated by home health.  She lives alone. Patient does not have power of attorney or any close family around.  When I saw pt in ED, she is alert, but confused.  She knows her own name, and is not oriented to the place and time.  She moves all extremities normally.  No facial droop or slurred speech.  Patient denies any chest pain or abdominal pain.  No active nausea, vomiting, diarrhea, respiratory distress or cough noted.  Not sure if patient has symptoms of UTI.  ED Course: pt was found to have WBC 11.0, urinalysis (cloudy appearance, negative leukocyte, rare bacteria, WBC 11-20), CK level 509, pending COVID-19 PCR, electrolytes renal function okay, temperature normal, blood pressure 139/71, heart rate 86, RR 20, oxygen saturation 98% on room air.  Chest x-ray negative.  CT of head and CT of C-spine negative for acute issues.  Patient is placed on MedSurg bed for observation   Review of Systems: Could not be reviewed  accurately due to confusion   Allergy:  Allergies  Allergen Reactions  . Sulfa Antibiotics     Past Medical History:  Diagnosis Date  . Anxiety and depression     Past Surgical History:  Procedure Laterality Date  . ABDOMINAL HYSTERECTOMY     1980    Social History:  reports that she has never smoked. She has never used smokeless tobacco. She reports current alcohol use. She reports that she does not use drugs.  Family History:  Family History  Problem Relation Age of Onset  . Arthritis Mother   . Heart disease Father        Deceased from MI  . Stroke Maternal Grandmother 85  . Arthritis Maternal Grandfather      Prior to Admission medications   Medication Sig Start Date End Date Taking? Authorizing Provider  buPROPion (WELLBUTRIN XL) 300 MG 24 hr tablet TAKE 1 TABLET BY MOUTH EVERY DAY 10/25/20   Pleas Koch, NP  citalopram (CELEXA) 20 MG tablet TAKE 1 TABLET BY MOUTH EVERY DAY 10/25/20   Pleas Koch, NP  hydrOXYzine (ATARAX/VISTARIL) 25 MG tablet TAKE 1 TABLET (25 MG TOTAL) BY MOUTH AT BEDTIME AS NEEDED FOR ANXIETY (SLEEP). 10/08/18   Pleas Koch, NP    Physical Exam: Vitals:   12/29/20 0900 12/29/20 1306 12/29/20 1400 12/29/20 1546  BP: 139/71 140/84 135/78 134/71  Pulse: 86 81 86 79  Resp: 20 18 15 16   Temp:  98.4 F (36.9 C)  TempSrc:      SpO2: 98% 97% 98% 100%  Weight:      Height:       General: Not in acute distress HEENT:       Eyes: PERRL, EOMI, no scleral icterus.       ENT: No discharge from the ears and nose       Neck: No JVD, no bruit, no mass felt. Heme: No neck lymph node enlargement. Cardiac: S1/S2, RRR, No murmurs, No gallops or rubs. Respiratory: No rales, wheezing, rhonchi or rubs. GI: Soft, nondistended, nontender, no organomegaly, BS present. GU: No hematuria Ext: No pitting leg edema bilaterally. 1+DP/PT pulse bilaterally. Musculoskeletal: No joint deformities, No joint redness or warmth, no limitation of ROM  in spin. Skin: No rashes.  Neuro: Alert, confused, knows her own name, is not oriented to placed and time, cranial nerves II-XII grossly intact, moves all extremities normally.  Psych: Patient is not psychotic, no suicidal or hemocidal ideation.  Labs on Admission: I have personally reviewed following labs and imaging studies  CBC: Recent Labs  Lab 12/29/20 0639  WBC 11.0*  NEUTROABS 8.3*  HGB 12.1  HCT 36.6  MCV 88.4  PLT 563   Basic Metabolic Panel: Recent Labs  Lab 12/29/20 0639  NA 141  K 3.5  CL 108  CO2 24  GLUCOSE 89  BUN 17  CREATININE 0.68  CALCIUM 9.0   GFR: Estimated Creatinine Clearance: 44.4 mL/min (by C-G formula based on SCr of 0.68 mg/dL). Liver Function Tests: Recent Labs  Lab 12/29/20 0639  AST 28  ALT 20  ALKPHOS 38  BILITOT 1.0  PROT 6.4*  ALBUMIN 3.8   No results for input(s): LIPASE, AMYLASE in the last 168 hours. No results for input(s): AMMONIA in the last 168 hours. Coagulation Profile: No results for input(s): INR, PROTIME in the last 168 hours. Cardiac Enzymes: Recent Labs  Lab 12/29/20 0639  CKTOTAL 509*   BNP (last 3 results) No results for input(s): PROBNP in the last 8760 hours. HbA1C: No results for input(s): HGBA1C in the last 72 hours. CBG: No results for input(s): GLUCAP in the last 168 hours. Lipid Profile: No results for input(s): CHOL, HDL, LDLCALC, TRIG, CHOLHDL, LDLDIRECT in the last 72 hours. Thyroid Function Tests: No results for input(s): TSH, T4TOTAL, FREET4, T3FREE, THYROIDAB in the last 72 hours. Anemia Panel: No results for input(s): VITAMINB12, FOLATE, FERRITIN, TIBC, IRON, RETICCTPCT in the last 72 hours. Urine analysis:    Component Value Date/Time   COLORURINE YELLOW (A) 12/29/2020 0644   APPEARANCEUR CLOUDY (A) 12/29/2020 0644   LABSPEC 1.018 12/29/2020 0644   PHURINE 6.0 12/29/2020 0644   GLUCOSEU NEGATIVE 12/29/2020 0644   HGBUR SMALL (A) 12/29/2020 0644   BILIRUBINUR NEGATIVE 12/29/2020  0644   KETONESUR 20 (A) 12/29/2020 0644   PROTEINUR NEGATIVE 12/29/2020 0644   NITRITE NEGATIVE 12/29/2020 0644   LEUKOCYTESUR NEGATIVE 12/29/2020 0644   Sepsis Labs: @LABRCNTIP (procalcitonin:4,lacticidven:4) ) Recent Results (from the past 240 hour(s))  SARS CORONAVIRUS 2 (TAT 6-24 HRS) Nasopharyngeal Nasopharyngeal Swab     Status: None   Collection Time: 12/29/20  7:32 AM   Specimen: Nasopharyngeal Swab  Result Value Ref Range Status   SARS Coronavirus 2 NEGATIVE NEGATIVE Final    Comment: (NOTE) SARS-CoV-2 target nucleic acids are NOT DETECTED.  The SARS-CoV-2 RNA is generally detectable in upper and lower respiratory specimens during the acute phase of infection. Negative results do not preclude SARS-CoV-2 infection, do  not rule out co-infections with other pathogens, and should not be used as the sole basis for treatment or other patient management decisions. Negative results must be combined with clinical observations, patient history, and epidemiological information. The expected result is Negative.  Fact Sheet for Patients: SugarRoll.be  Fact Sheet for Healthcare Providers: https://www.woods-mathews.com/  This test is not yet approved or cleared by the [place] FDA and  has been authorized for detection and/or diagnosis of SARS-CoV-2 by FDA under an Emergency Use Authorization (EUA). This EUA will remain  in effect (meaning this test can be used) for the duration of the COVID-19 declaration under Se ction 564(b)(1) of the Act, 21 U.S.C. section 360bbb-3(b)(1), unless the authorization is terminated or revoked sooner.  Performed at [facility], [place] [address], [place],  [address]      Radiological Exams on Admission: CT Head Wo Contrast  Result Date: 12/29/2020 CLINICAL DATA:  Confusion.  Unwitnessed fall EXAM: CT HEAD WITHOUT CONTRAST CT CERVICAL SPINE WITHOUT CONTRAST TECHNIQUE: Multidetector CT  imaging of the head and cervical spine was performed following the standard protocol without intravenous contrast. Multiplanar CT image reconstructions of the cervical spine were also generated. COMPARISON:  None. FINDINGS: CT HEAD FINDINGS Brain: There is moderate diffuse atrophy. There is no intracranial mass, hemorrhage, extra-axial fluid collection, or midline shift. There is mild patchy decreased attenuation in the centra semiovale bilaterally. No acute infarct is demonstrable on this study. Vascular: No hyperdense vessels. No appreciable vascular calcification. Skull: Bony calvarium appears intact. Sinuses/Orbits: Paranasal sinuses are clear. There is rightward deviation of the nasal septum. Orbits appear symmetric bilaterally. Other: Mastoid air cells are clear. CT CERVICAL SPINE FINDINGS Alignment: There is 2 mm of anterolisthesis of C4 on C5. There is 1 mm of anterolisthesis of C7 on T1. Skull base and vertebrae: Skull base and craniocervical junction regions appear normal. Mild pannus is noted posterior to the odontoid without appreciable impression on the craniocervical junction. No acute fracture evident. No blastic or lytic bone lesions. Soft tissues and spinal canal: Prevertebral soft tissues and predental space regions are normal. No cord or canal hematoma. No evident paraspinous lesion. Disc levels: There is severe disc space narrowing at C3-4, C5-6, C6-7. There is moderate disc space narrowing at other levels. There is multilevel facet hypertrophy. There is exit foraminal narrowing on the right due to bony hypertrophy at C5-6 with impression on the exiting nerve root. Similar changes are noted to a slightly lesser degree on the left at C6-7. There is no disc extrusion or high-grade stenosis. Upper chest: Visualized upper lung regions are clear. Other: There is mild calcification in each carotid artery. IMPRESSION: CT head: Atrophy with periventricular small vessel disease. No evident acute infarct.  No mass or hemorrhage. CT cervical spine: No fracture. Areas of slight spondylolisthesis likely due to underlying spondylosis. There is multilevel arthropathy without frank disc extrusion or stenosis. Foci of carotid artery calcification bilaterally noted. Electronically Signed   By: Lowella Grip III M.D.   On: 12/29/2020 08:09   CT Cervical Spine Wo Contrast  Result Date: 12/29/2020 CLINICAL DATA:  Confusion.  Unwitnessed fall EXAM: CT HEAD WITHOUT CONTRAST CT CERVICAL SPINE WITHOUT CONTRAST TECHNIQUE: Multidetector CT imaging of the head and cervical spine was performed following the standard protocol without intravenous contrast. Multiplanar CT image reconstructions of the cervical spine were also generated. COMPARISON:  None. FINDINGS: CT HEAD FINDINGS Brain: There is moderate diffuse atrophy. There is no intracranial mass, hemorrhage, extra-axial fluid collection,  or midline shift. There is mild patchy decreased attenuation in the centra semiovale bilaterally. No acute infarct is demonstrable on this study. Vascular: No hyperdense vessels. No appreciable vascular calcification. Skull: Bony calvarium appears intact. Sinuses/Orbits: Paranasal sinuses are clear. There is rightward deviation of the nasal septum. Orbits appear symmetric bilaterally. Other: Mastoid air cells are clear. CT CERVICAL SPINE FINDINGS Alignment: There is 2 mm of anterolisthesis of C4 on C5. There is 1 mm of anterolisthesis of C7 on T1. Skull base and vertebrae: Skull base and craniocervical junction regions appear normal. Mild pannus is noted posterior to the odontoid without appreciable impression on the craniocervical junction. No acute fracture evident. No blastic or lytic bone lesions. Soft tissues and spinal canal: Prevertebral soft tissues and predental space regions are normal. No cord or canal hematoma. No evident paraspinous lesion. Disc levels: There is severe disc space narrowing at C3-4, C5-6, C6-7. There is moderate  disc space narrowing at other levels. There is multilevel facet hypertrophy. There is exit foraminal narrowing on the right due to bony hypertrophy at C5-6 with impression on the exiting nerve root. Similar changes are noted to a slightly lesser degree on the left at C6-7. There is no disc extrusion or high-grade stenosis. Upper chest: Visualized upper lung regions are clear. Other: There is mild calcification in each carotid artery. IMPRESSION: CT head: Atrophy with periventricular small vessel disease. No evident acute infarct. No mass or hemorrhage. CT cervical spine: No fracture. Areas of slight spondylolisthesis likely due to underlying spondylosis. There is multilevel arthropathy without frank disc extrusion or stenosis. Foci of carotid artery calcification bilaterally noted. Electronically Signed   By: Lowella Grip III M.D.   On: 12/29/2020 08:09   DG Chest Portable 1 View  Result Date: 12/29/2020 CLINICAL DATA:  Weakness. EXAM: PORTABLE CHEST 1 VIEW COMPARISON:  No prior. FINDINGS: Heart size normal. Pulmonary vascularity normal. Low lung volumes. Mild bibasilar subsegmental atelectasis. No pleural effusion or pneumothorax. Degenerative changes scoliosis thoracic spine. IMPRESSION: Low lung volumes with mild bibasilar subsegmental atelectasis. Electronically Signed   By: Marcello Moores  Register   On: 12/29/2020 07:25     EKG: I have personally reviewed.  Sinus rhythm, QTC 462, low voltage  Assessment/Plan Principal Problem:   UTI (urinary tract infection) Active Problems:   Anxiety and depression   Fall at home, initial encounter   Acute metabolic encephalopathy   UTI (urinary tract infection) -place in med-surg bed for obs -Rocephin -f/u Bx and Ux  Anxiety and depression -Wellbutrin, Celexa, as needed hydralazine  Fall at home, initial encounter: CT of the head and CT of C-spine negative. -PT/OT   -consult transition care team for possible SNF placement  Acute metabolic  encephalopathy: Likely due to UTI.  Per report, patient has hallucination and confusion for several month, patient may have early stage of dementia. -Frequent neuro check -Check TSH, vitamin B12, vitamin B1 level     DVT ppx: SQ Lovenox Code Status: Full code Family Communication: not done, no family member is at bed side. I tried to call patient's friend without success. Disposition Plan:  Anticipate discharge back to previous environment Consults called: none  Admission status and Level of care: Med-Surg:  For obs    Status is: Observation  The patient remains OBS appropriate and will d/c before 2 midnights.  Dispo: The patient is from: Home              Anticipated d/c is to: to be determined  Patient currently is not medically stable to d/c.   Difficult to place patient No          Date of Service 12/29/2020    Ivor Costa [facility]   If 7PM-7AM, please contact night-coverage [email or web address] 12/29/2020, 5:39 PM

## 2020-12-29 NOTE — ED Notes (Signed)
Pt ate 25% of lunch tray. Left  w/visitor in room.

## 2020-12-29 NOTE — ED Notes (Addendum)
Patient's friend, [name], is visiting with patient. She states that patient has been "hallucinating and acting different" for the past few months. Patient's daughter, Kathleen Lime, lives in [place] and has arranged for patient to get evaluated by home health. Per friend, home health states that she is above their level of care. Neighbors and friends have been frequently checking on patient while at home but seems that she needs help as the patient lives alone. According to friend, patient does not POA or any close family around. Friend would like social work/case management consult for safe discharge.  Message sent to Blaine Hamper, MD stating same.  Kathleen Lime, daughter [phone] ([place]) [name], friend [phone]

## 2020-12-29 NOTE — ED Notes (Signed)
Spoke with patient's daughter, Cyril Mourning. She gave consent to give information to [facility] as a point of contact if unable to get a hold of her due to being in [place].

## 2020-12-29 NOTE — Progress Notes (Addendum)
Pt had family member called room. Name is not located in chart. Pt verbalized she knew family member it was her Son In Sports coach Cecilie Lowers who is currently in [place].   Attempted to call the contact Izora Gala on the chart to confirm but couldn't not reach The number possibly disconnected. Pt did speak with son in law but no information provided. Pt also refused Lovenox. Dr. Sidney Ace notified and dose was rescheduled for am per patients request and verbalized time change with MD.

## 2020-12-30 DIAGNOSIS — E876 Hypokalemia: Secondary | ICD-10-CM | POA: Diagnosis present

## 2020-12-30 DIAGNOSIS — N390 Urinary tract infection, site not specified: Secondary | ICD-10-CM | POA: Diagnosis present

## 2020-12-30 DIAGNOSIS — Y92009 Unspecified place in unspecified non-institutional (private) residence as the place of occurrence of the external cause: Secondary | ICD-10-CM | POA: Diagnosis not present

## 2020-12-30 DIAGNOSIS — N3000 Acute cystitis without hematuria: Secondary | ICD-10-CM | POA: Diagnosis not present

## 2020-12-30 DIAGNOSIS — Z882 Allergy status to sulfonamides status: Secondary | ICD-10-CM | POA: Diagnosis not present

## 2020-12-30 DIAGNOSIS — F0390 Unspecified dementia without behavioral disturbance: Secondary | ICD-10-CM | POA: Diagnosis present

## 2020-12-30 DIAGNOSIS — Z79899 Other long term (current) drug therapy: Secondary | ICD-10-CM | POA: Diagnosis not present

## 2020-12-30 DIAGNOSIS — R4189 Other symptoms and signs involving cognitive functions and awareness: Secondary | ICD-10-CM | POA: Diagnosis present

## 2020-12-30 DIAGNOSIS — R739 Hyperglycemia, unspecified: Secondary | ICD-10-CM | POA: Diagnosis present

## 2020-12-30 DIAGNOSIS — Z823 Family history of stroke: Secondary | ICD-10-CM | POA: Diagnosis not present

## 2020-12-30 DIAGNOSIS — Z8261 Family history of arthritis: Secondary | ICD-10-CM | POA: Diagnosis not present

## 2020-12-30 DIAGNOSIS — F32A Depression, unspecified: Secondary | ICD-10-CM | POA: Diagnosis present

## 2020-12-30 DIAGNOSIS — R443 Hallucinations, unspecified: Secondary | ICD-10-CM | POA: Diagnosis present

## 2020-12-30 DIAGNOSIS — G9341 Metabolic encephalopathy: Secondary | ICD-10-CM | POA: Diagnosis present

## 2020-12-30 DIAGNOSIS — F4322 Adjustment disorder with anxiety: Secondary | ICD-10-CM | POA: Diagnosis present

## 2020-12-30 DIAGNOSIS — Z20822 Contact with and (suspected) exposure to covid-19: Secondary | ICD-10-CM | POA: Diagnosis present

## 2020-12-30 DIAGNOSIS — W19XXXA Unspecified fall, initial encounter: Secondary | ICD-10-CM | POA: Diagnosis not present

## 2020-12-30 DIAGNOSIS — W1830XA Fall on same level, unspecified, initial encounter: Secondary | ICD-10-CM | POA: Diagnosis present

## 2020-12-30 DIAGNOSIS — F419 Anxiety disorder, unspecified: Secondary | ICD-10-CM | POA: Diagnosis not present

## 2020-12-30 DIAGNOSIS — L89152 Pressure ulcer of sacral region, stage 2: Secondary | ICD-10-CM | POA: Diagnosis present

## 2020-12-30 DIAGNOSIS — Z8249 Family history of ischemic heart disease and other diseases of the circulatory system: Secondary | ICD-10-CM | POA: Diagnosis not present

## 2020-12-30 LAB — GLUCOSE, CAPILLARY
Glucose-Capillary: 105 mg/dL — ABNORMAL HIGH (ref 70–99)
Glucose-Capillary: 250 mg/dL — ABNORMAL HIGH (ref 70–99)
Glucose-Capillary: 72 mg/dL (ref 70–99)
Glucose-Capillary: 77 mg/dL (ref 70–99)
Glucose-Capillary: 85 mg/dL (ref 70–99)

## 2020-12-30 LAB — CBC
HCT: 33.8 % — ABNORMAL LOW (ref 36.0–46.0)
Hemoglobin: 11 g/dL — ABNORMAL LOW (ref 12.0–15.0)
MCH: 29.3 pg (ref 26.0–34.0)
MCHC: 32.5 g/dL (ref 30.0–36.0)
MCV: 89.9 fL (ref 80.0–100.0)
Platelets: 261 10*3/uL (ref 150–400)
RBC: 3.76 MIL/uL — ABNORMAL LOW (ref 3.87–5.11)
RDW: 12.5 % (ref 11.5–15.5)
WBC: 6.9 10*3/uL (ref 4.0–10.5)
nRBC: 0 % (ref 0.0–0.2)

## 2020-12-30 LAB — HEMOGLOBIN A1C
Hgb A1c MFr Bld: 5.3 % (ref 4.8–5.6)
Mean Plasma Glucose: 105.41 mg/dL

## 2020-12-30 LAB — BASIC METABOLIC PANEL
Anion gap: 10 (ref 5–15)
BUN: 10 mg/dL (ref 8–23)
CO2: 23 mmol/L (ref 22–32)
Calcium: 8.2 mg/dL — ABNORMAL LOW (ref 8.9–10.3)
Chloride: 109 mmol/L (ref 98–111)
Creatinine, Ser: 0.69 mg/dL (ref 0.44–1.00)
GFR, Estimated: >60 mL/min (ref >60)
Glucose, Bld: 80 mg/dL (ref 70–99)
Potassium: 3.3 mmol/L — ABNORMAL LOW (ref 3.5–5.1)
Sodium: 142 mmol/L (ref 135–145)

## 2020-12-30 LAB — CK: Total CK: 274 U/L — ABNORMAL HIGH (ref 38–234)

## 2020-12-30 LAB — VITAMIN B12: Vitamin B-12: 261 pg/mL (ref 180–914)

## 2020-12-30 MED ORDER — HALOPERIDOL 2 MG PO TABS
2.0000 mg | ORAL_TABLET | Freq: Four times a day (QID) | ORAL | Status: DC | PRN
  Administered 2020-12-30 – 2021-01-02 (×4): 2 mg via ORAL
  Filled 2020-12-30 (×6): qty 1

## 2020-12-30 MED ORDER — POTASSIUM CHLORIDE CRYS ER 20 MEQ PO TBCR
30.0000 meq | EXTENDED_RELEASE_TABLET | Freq: Once | ORAL | Status: AC
  Administered 2020-12-30: 13:00:00 30 meq via ORAL
  Filled 2020-12-30: qty 1

## 2020-12-30 MED ORDER — INSULIN ASPART 100 UNIT/ML ~~LOC~~ SOLN: 0.0000 [IU] | Freq: Three times a day (TID) | SUBCUTANEOUS | Status: DC

## 2020-12-30 MED ORDER — VITAMIN B-12 1000 MCG PO TABS
500.0000 ug | ORAL_TABLET | Freq: Every day | ORAL | Status: DC
  Administered 2020-12-30 – 2021-01-04 (×6): 500 ug via ORAL
  Filled 2020-12-30 (×6): qty 1

## 2020-12-30 NOTE — Plan of Care (Signed)

## 2020-12-30 NOTE — Evaluation (Signed)
Physical Therapy Evaluation Patient Details Name: Joanna Reed MRN: [id number] DOB: July 12, 1941 Today's Date: 12/30/2020   History of Present Illness  Pt is an 80 y/o F with PMH: depression and anxiety who was found down by a neighbor. Presented to ED via EMS with confusion. Currently being treated for acute metabolic encephalopathy related to UTI.  Clinical Impression  Patient seated in recliner upon arrival to room; sitter at bedside for safety.  Patient alert and oriented to self only; does follow simple commands, but requires constant reorientation to conversation, task at hand.  Appears very highly distractible by both internal/external environments.  Bilat UE/LE strength and ROM generally weak due to acute illness, but functional for basic transfers and ambulation.  No focal weakness appreciated.  Able to complete sit/stand, basic transfers and gait (20') without assist device, min assist.  Demonstrates inconsistent, staggered gait pattern; limited balance reactions evident.  Increased sway, all planes; intermittently reaching for walls/furniture for stabilization as needed.  does require +1 for optimal safety. Additional gait trial completed, 200' with RW, cga/min assist-improved gait mechanics, fluidity and overall safety; consistent cuing for walker position and management; veers/sways with head turns and dynamic gait components.  Requires RW and +1 throughout.  Gait speed with RW: 10' walk time, 12 seconds; below age-matched norms and indicative of increased fall risk. Would benefit from skilled PT to address above deficits and promote optimal return to PLOF.; recommend transition to STR upon discharge from acute hospitalization.      Follow Up Recommendations SNF    Equipment Recommendations       Recommendations for Other Services       Precautions / Restrictions Precautions Precautions: Fall Restrictions Weight Bearing Restrictions: No      Mobility  Bed Mobility                General bed mobility comments: seated in recliner beginning/end of treatment session    Transfers Overall transfer level: Needs assistance Equipment used: Rolling walker (2 wheeled) Transfers: Sit to/from Stand Sit to Stand: Min guard;Min assist Stand pivot transfers: Min guard       General transfer comment: slightly impulsive; does require UE support to assist with lift off  Ambulation/Gait Ambulation/Gait assistance: Min guard;Min assist Gait Distance (Feet): 20 Feet Assistive device: None       General Gait Details: inconsistent, staggered gait pattern; limited balance reactions evident.  Increased sway, all planes; intermittently reaching for walls/furniture for stabilization as needed.  does require +1 for optimal safety.  Stairs            Wheelchair Mobility    Modified Rankin (Stroke Patients Only)       Balance Overall balance assessment: Needs assistance Sitting-balance support: No upper extremity supported;Feet supported Sitting balance-Leahy Scale: Good     Standing balance support: No upper extremity supported Standing balance-Leahy Scale: Fair Standing balance comment: UE support with RW                             Pertinent Vitals/Pain Pain Assessment: No/denies pain Faces Pain Scale: No hurt    Home Living Family/patient expects to be discharged to:: Private residence Living Arrangements: Alone Available Help at Discharge: Neighbor;Available PRN/intermittently             Additional Comments: Patient unreliable historian; will verify with family as available    Prior Function Level of Independence: Independent         Comments:  Patient unreliable historian; will verify with family as available.  Does appear to indicate mobility without assist device at baseline     Hand Dominance        Extremity/Trunk Assessment   Upper Extremity Assessment Upper Extremity Assessment: Generalized weakness     Lower Extremity Assessment Lower Extremity Assessment: Generalized weakness (grossly 4-/5 throughout)       Communication   Communication: No difficulties (clear, fluent and easy to comprehend; able to make needs known)  Cognition Arousal/Alertness: Awake/alert Behavior During Therapy: WFL for tasks assessed/performed Overall Cognitive Status: No family/caregiver present to determine baseline cognitive functioning                                 General Comments: Alert and oriented to self only; does follow simple commands, but requires consistent reorientation to conversation, task at hand.  Highly distractible by both internal and external environments.  Limited insight and safety awareness evident.      General Comments      Exercises Other Exercises Other Exercises: 200' with RW, cga/min assist-improved gait mechanics, fluidity and overall safety; consistent cuing for walker position and management; veers/sways with head turns and dynamic gait components.  Requires RW and +1 throughout. Other Exercises: Gait speed with RW: 10' walk time, 12 seconds; below age-matched norms and indicative of increased fall risk.   Assessment/Plan    PT Assessment Patient needs continued PT services  PT Problem List Decreased strength;Decreased activity tolerance;Decreased balance;Decreased mobility;Decreased coordination;Decreased cognition;Decreased knowledge of use of DME;Decreased safety awareness;Decreased knowledge of precautions       PT Treatment Interventions DME instruction;Gait training;Stair training;Functional mobility training;Therapeutic activities;Therapeutic exercise;Balance training;Patient/family education;Cognitive remediation    PT Goals (Current goals can be found in the Care Plan section)  Acute Rehab PT Goals Patient Stated Goal: to go home and see my cat PT Goal Formulation: With patient Time For Goal Achievement: 01/13/21 Potential to Achieve Goals:  Fair    Frequency Min 2X/week   Barriers to discharge        Co-evaluation               AM-PAC PT "6 Clicks" Mobility  Outcome Measure Help needed turning from your back to your side while in a flat bed without using bedrails?: None Help needed moving from lying on your back to sitting on the side of a flat bed without using bedrails?: None Help needed moving to and from a bed to a chair (including a wheelchair)?: A Little Help needed standing up from a chair using your arms (e.g., wheelchair or bedside chair)?: A Little Help needed to walk in hospital room?: A Little Help needed climbing 3-5 steps with a railing? : A Little 6 Click Score: 20    End of Session Equipment Utilized During Treatment: Gait belt Activity Tolerance: Patient tolerated treatment well Patient left: in chair;with nursing/sitter in room;with call bell/phone within reach Nurse Communication: Mobility status PT Visit Diagnosis: Muscle weakness (generalized) (M62.81);Difficulty in walking, not elsewhere classified (R26.2)    Time: 2993-7169 PT Time Calculation (min) (ACUTE ONLY): 21 min   Charges:   PT Evaluation $PT Eval Moderate Complexity: 1 Mod PT Treatments $Gait Training: 8-22 mins      Kryssa Risenhoover H. Owens Shark, PT, DPT, NCS 12/30/20, 10:39 AM [phone]

## 2020-12-30 NOTE — TOC Initial Note (Addendum)
Transition of Care [facility]) - Initial/Assessment Note    Patient Details  Name: Joanna Reed MRN: [id number] Date of Birth: December 09, 1940  Transition of Care [facility]) CM/SW Contact:    Magnus Ivan, LCSW Phone Number: 12/30/2020, 10:43 AM  Clinical Narrative:                Patient is disoriented x 3. Daughter Cyril Mourning lives in [place] and has assigned friend Hoyle Sauer who lives in [place] as primary contact for patient. Hoyle Sauer requested updates from medical staff via phone as she lives over an hour away.  CSW spoke with Hoyle Sauer who reported at baseline patient is alert and oriented x 4 and drives herself to appointments. Patient lives alone. No DME, HH, or SNF history. PCP is [facility]. Pharmacy is unknown. Patient has not had the COVID vaccines and does not want them per Hoyle Sauer. Patient has a neighbor, Environmental health practitioner, who helps her.   Hoyle Sauer reported she and patient's daughter are concerned about patient's mental status and whether patient will be back to her baseline mentally after UTI is cleared or not.  CSW explained recommendation for SNF for short term rehab when patient is medically ready. Hoyle Sauer reported they agree with this recommendation. CSW will start SNF work up.    4:00- Call from patient's daughter Kathleen Lime in [place]. She reported she is aware of plan for SNF rehab and would like Hoyle Sauer to continue to be the main contact for patient and Hoyle Sauer will keep Cyril Mourning updated. She reported her phone # is [phone] - international call- and can be reached after 2:30 pm our time if needed. Informed her we would likely not be able to complete an international call. She can also be reached via email if needed- [email or web address].   Kristen asked for [facility] contacts for next week, contact info provided.   Expected Discharge Plan: Skilled Nursing Facility Barriers to Discharge: Continued Medical Work up   Patient Goals and CMS Choice Patient states their goals for this  hospitalization and ongoing recovery are:: SNF rehab CMS Medicare.gov Compare Post Acute Care list provided to:: Patient Represenative (must comment) Choice offered to / list presented to :  (friend)  Expected Discharge Plan and Services Expected Discharge Plan: [place]       Living arrangements for the past 2 months: Single Family Home                                      Prior Living Arrangements/Services Living arrangements for the past 2 months: Single Family Home Lives with:: Self Patient language and need for interpreter reviewed:: Yes Do you feel safe going back to the place where you live?: Yes      Need for Family Participation in Patient Care: Yes (Comment) Care giver support system in place?: Yes (comment)   Criminal Activity/Legal Involvement Pertinent to Current Situation/Hospitalization: No - Comment as needed  Activities of Daily Living Home Assistive Devices/Equipment: None ADL Screening (condition at time of admission) Patient's cognitive ability adequate to safely complete daily activities?: No Is the patient deaf or have difficulty hearing?: No Does the patient have difficulty seeing, even when wearing glasses/contacts?: No Does the patient have difficulty concentrating, remembering, or making decisions?: Yes Patient able to express need for assistance with ADLs?: No Does the patient have difficulty dressing or bathing?: No Independently performs ADLs?: Yes (appropriate for developmental age) Does the patient have difficulty walking  or climbing stairs?: Yes Weakness of Legs: Both Weakness of Arms/Hands: Both  Permission Sought/Granted Permission sought to share information with : Chartered certified accountant granted to share information with : Yes, Verbal Permission Granted  Share Information with NAME: Marisol (neighbor)  Permission granted to share info w AGENCY: SNFs        Emotional Assessment        Orientation: : Fluctuating Orientation (Suspected and/or reported Sundowners) Alcohol / Substance Use: Not Applicable Psych Involvement: No (comment)  Admission diagnosis:  Lower urinary tract infectious disease [N39.0] Confusion [R41.0] UTI (urinary tract infection) [N39.0] Fall, initial encounter [W19.XXXA] Patient Active Problem List   Diagnosis Date Noted  . UTI (urinary tract infection) 12/29/2020  . Fall at home, initial encounter 12/29/2020  . Acute metabolic encephalopathy 86/28/2417  . Pressure injury of skin 12/29/2020  . Preventative health care 03/19/2018  . Chronic knee pain 02/27/2017  . Medicare annual wellness visit, subsequent 01/28/2015  . Anxiety and depression 01/19/2015  . Acute shoulder pain 01/19/2015   PCP:  Pleas Koch, NP Pharmacy:   [id number] - [place], [place] [address] [place] [address] Phone: [phone] Fax: [phone]     Social Determinants of Health (SDOH) Interventions    Readmission Risk Interventions No flowsheet data found.

## 2020-12-30 NOTE — Progress Notes (Signed)
Progress Note    Joanna Reed  VWP:[id number] DOB: 1940/11/10  DOA: 12/29/2020 PCP: Pleas Koch, NP    Brief Narrative:     Medical records reviewed and are as summarized below:  Joanna Reed is an 80 y.o. female with medical history significant of depression with anxiety, who presents with altered mental status and fall.  Found to have a UTI.    Assessment/Plan:   Principal Problem:   UTI (urinary tract infection) Active Problems:   Anxiety and depression   Fall at home, initial encounter   Acute metabolic encephalopathy   Pressure injury of skin     UTI (urinary tract infection) -Rocephin IV -f/u Bx - Ux ccx growing gram negative rods >100,000  Anxiety and depression -Wellbutrin, Celexa  Fall at home, initial encounter: CT of the head and CT of C-spine negative. -PT/OT   -consult transition care team for possible SNF placement  Acute metabolic encephalopathy: Likely due to UTI.   - TSH normal, vitamin B12 low normal, vitamin B1 level  Hyperglycemia -check HgbA1C -SSI  Hypokalemia -replete   Family Communication/Anticipated D/C date and plan/Code Status   DVT prophylaxis: Lovenox ordered. Code Status: Full Code.   Disposition Plan: Status is: Observation  The patient will require care spanning > 2 midnights and should be moved to inpatient because: Inpatient level of care appropriate due to severity of illness  Dispo: The patient is from: Home              Anticipated d/c is to: SNF              Patient currently is not medically stable to d/c.   Difficult to place patient No    Medical Consultants:    None.    Subjective:   Says she is being made to color  Objective:    Vitals:   12/29/20 1546 12/30/20 0210 12/30/20 0616 12/30/20 0852  BP: 134/71 117/65 131/62 139/79  Pulse: 79 83 77 100  Resp: 16 18 18    Temp: 98.4 F (36.9 C) 97.6 F (36.4 C) 98 F (36.7 C) 97.9 F (36.6 C)  TempSrc:  Oral Oral    SpO2: 100% 99% 99% 99%  Weight:      Height:        Intake/Output Summary (Last 24 hours) at 12/30/2020 1011 Last data filed at 12/30/2020 3748 Gross per 24 hour  Intake 1235 ml  Output 1 ml  Net 1234 ml   Filed Weights   12/29/20 0634  Weight: 59 kg    Exam:  General: Appearance:    elderly female in no acute distress     Lungs:      respirations unlabored  Heart:    Tachycardic. Normal rhythm. No murmurs, rubs, or gallops.   MS:   All extremities are intact.   Neurologic:   Awake, confused to certain situations (knows she has a cat named Ebony)    Data Reviewed:   I have personally reviewed following labs and imaging studies:  Labs: Labs show the following:   Basic Metabolic Panel: Recent Labs  Lab 12/29/20 0639 12/30/20 0532  NA 141 142  K 3.5 3.3*  CL 108 109  CO2 24 23  GLUCOSE 89 80  BUN 17 10  CREATININE 0.68 0.69  CALCIUM 9.0 8.2*   GFR Estimated Creatinine Clearance: 44.4 mL/min (by C-G formula based on SCr of 0.69 mg/dL). Liver Function Tests: Recent Labs  Lab 12/29/20 0639  AST 28  ALT 20  ALKPHOS 38  BILITOT 1.0  PROT 6.4*  ALBUMIN 3.8   No results for input(s): LIPASE, AMYLASE in the last 168 hours. No results for input(s): AMMONIA in the last 168 hours. Coagulation profile No results for input(s): INR, PROTIME in the last 168 hours.  CBC: Recent Labs  Lab 12/29/20 0639 12/30/20 0532  WBC 11.0* 6.9  NEUTROABS 8.3*  --   HGB 12.1 11.0*  HCT 36.6 33.8*  MCV 88.4 89.9  PLT 268 261   Cardiac Enzymes: Recent Labs  Lab 12/29/20 0639 12/30/20 0532  CKTOTAL 509* 274*   BNP (last 3 results) No results for input(s): PROBNP in the last 8760 hours. CBG: Recent Labs  Lab 12/30/20 0855  GLUCAP 250*   D-Dimer: No results for input(s): DDIMER in the last 72 hours. Hgb A1c: No results for input(s): HGBA1C in the last 72 hours. Lipid Profile: No results for input(s): CHOL, HDL, LDLCALC, TRIG, CHOLHDL, LDLDIRECT in the last  72 hours. Thyroid function studies: Recent Labs    12/29/20 1925  TSH 2.430   Anemia work up: Recent Labs    12/29/20 1925  VITAMINB12 261   Sepsis Labs: Recent Labs  Lab 12/29/20 0639 12/30/20 0532  WBC 11.0* 6.9    Microbiology Recent Results (from the past 240 hour(s))  Urine Culture     Status: Abnormal (Preliminary result)   Collection Time: 12/29/20  6:44 AM   Specimen: Urine, Random  Result Value Ref Range Status   Specimen Description   Final    URINE, RANDOM Performed at [facility], [address], [place], [place] [address]    Special Requests   Final    NONE Performed at [facility], [address], [place], [place] [address]    Culture (A)  Final    >=100,000 COLONIES/mL Lonell Grandchild NEGATIVE RODS SUSCEPTIBILITIES TO FOLLOW Performed at [facility], [place] [address] [place], [place] [address]    Report Status PENDING  Incomplete  SARS CORONAVIRUS 2 (TAT 6-24 HRS) Nasopharyngeal Nasopharyngeal Swab     Status: None   Collection Time: 12/29/20  7:32 AM   Specimen: Nasopharyngeal Swab  Result Value Ref Range Status   SARS Coronavirus 2 NEGATIVE NEGATIVE Final    Comment: (NOTE) SARS-CoV-2 target nucleic acids are NOT DETECTED.  The SARS-CoV-2 RNA is generally detectable in upper and lower respiratory specimens during the acute phase of infection. Negative results do not preclude SARS-CoV-2 infection, do not rule out co-infections with other pathogens, and should not be used as the sole basis for treatment or other patient management decisions. Negative results must be combined with clinical observations, patient history, and epidemiological information. The expected result is Negative.  Fact Sheet for Patients: SugarRoll.be  Fact Sheet for Healthcare Providers: https://www.woods-mathews.com/  This test is not yet approved or cleared by the [place] FDA and  has been authorized  for detection and/or diagnosis of SARS-CoV-2 by FDA under an Emergency Use Authorization (EUA). This EUA will remain  in effect (meaning this test can be used) for the duration of the COVID-19 declaration under Se ction 564(b)(1) of the Act, 21 U.S.C. section 360bbb-3(b)(1), unless the authorization is terminated or revoked sooner.  Performed at [facility], [place] [address], [place], [place] [address]   Culture, blood (Routine X 2) w Reflex to ID Panel     Status: None (Preliminary result)   Collection Time: 12/29/20 11:21 AM   Specimen: BLOOD  Result Value  Ref Range Status   Specimen Description BLOOD LEFT ANTECUBITAL  Final   Special Requests   Final    BOTTLES DRAWN AEROBIC AND ANAEROBIC Blood Culture adequate volume   Culture   Final    NO GROWTH < 24 HOURS Performed at [facility], [address] [place], [place] [address]    Report Status PENDING  Incomplete  Culture, blood (Routine X 2) w Reflex to ID Panel     Status: None (Preliminary result)   Collection Time: 12/29/20 11:21 AM   Specimen: BLOOD  Result Value Ref Range Status   Specimen Description BLOOD RIGHT FOREARM  Final   Special Requests   Final    BOTTLES DRAWN AEROBIC AND ANAEROBIC Blood Culture results may not be optimal due to an excessive volume of blood received in culture bottles   Culture   Final    NO GROWTH < 24 HOURS Performed at [facility], [address], [place], [place] [address]    Report Status PENDING  Incomplete    Procedures and diagnostic studies:  CT Head Wo Contrast  Result Date: 12/29/2020 CLINICAL DATA:  Confusion.  Unwitnessed fall EXAM: CT HEAD WITHOUT CONTRAST CT CERVICAL SPINE WITHOUT CONTRAST TECHNIQUE: Multidetector CT imaging of the head and cervical spine was performed following the standard protocol without intravenous contrast. Multiplanar CT image reconstructions of the cervical spine were also generated. COMPARISON:  None. FINDINGS: CT HEAD  FINDINGS Brain: There is moderate diffuse atrophy. There is no intracranial mass, hemorrhage, extra-axial fluid collection, or midline shift. There is mild patchy decreased attenuation in the centra semiovale bilaterally. No acute infarct is demonstrable on this study. Vascular: No hyperdense vessels. No appreciable vascular calcification. Skull: Bony calvarium appears intact. Sinuses/Orbits: Paranasal sinuses are clear. There is rightward deviation of the nasal septum. Orbits appear symmetric bilaterally. Other: Mastoid air cells are clear. CT CERVICAL SPINE FINDINGS Alignment: There is 2 mm of anterolisthesis of C4 on C5. There is 1 mm of anterolisthesis of C7 on T1. Skull base and vertebrae: Skull base and craniocervical junction regions appear normal. Mild pannus is noted posterior to the odontoid without appreciable impression on the craniocervical junction. No acute fracture evident. No blastic or lytic bone lesions. Soft tissues and spinal canal: Prevertebral soft tissues and predental space regions are normal. No cord or canal hematoma. No evident paraspinous lesion. Disc levels: There is severe disc space narrowing at C3-4, C5-6, C6-7. There is moderate disc space narrowing at other levels. There is multilevel facet hypertrophy. There is exit foraminal narrowing on the right due to bony hypertrophy at C5-6 with impression on the exiting nerve root. Similar changes are noted to a slightly lesser degree on the left at C6-7. There is no disc extrusion or high-grade stenosis. Upper chest: Visualized upper lung regions are clear. Other: There is mild calcification in each carotid artery. IMPRESSION: CT head: Atrophy with periventricular small vessel disease. No evident acute infarct. No mass or hemorrhage. CT cervical spine: No fracture. Areas of slight spondylolisthesis likely due to underlying spondylosis. There is multilevel arthropathy without frank disc extrusion or stenosis. Foci of carotid artery  calcification bilaterally noted. Electronically Signed   By: Lowella Grip III M.D.   On: 12/29/2020 08:09   CT Cervical Spine Wo Contrast  Result Date: 12/29/2020 CLINICAL DATA:  Confusion.  Unwitnessed fall EXAM: CT HEAD WITHOUT CONTRAST CT CERVICAL SPINE WITHOUT CONTRAST TECHNIQUE: Multidetector CT imaging of the head and cervical spine was performed following the standard protocol  without intravenous contrast. Multiplanar CT image reconstructions of the cervical spine were also generated. COMPARISON:  None. FINDINGS: CT HEAD FINDINGS Brain: There is moderate diffuse atrophy. There is no intracranial mass, hemorrhage, extra-axial fluid collection, or midline shift. There is mild patchy decreased attenuation in the centra semiovale bilaterally. No acute infarct is demonstrable on this study. Vascular: No hyperdense vessels. No appreciable vascular calcification. Skull: Bony calvarium appears intact. Sinuses/Orbits: Paranasal sinuses are clear. There is rightward deviation of the nasal septum. Orbits appear symmetric bilaterally. Other: Mastoid air cells are clear. CT CERVICAL SPINE FINDINGS Alignment: There is 2 mm of anterolisthesis of C4 on C5. There is 1 mm of anterolisthesis of C7 on T1. Skull base and vertebrae: Skull base and craniocervical junction regions appear normal. Mild pannus is noted posterior to the odontoid without appreciable impression on the craniocervical junction. No acute fracture evident. No blastic or lytic bone lesions. Soft tissues and spinal canal: Prevertebral soft tissues and predental space regions are normal. No cord or canal hematoma. No evident paraspinous lesion. Disc levels: There is severe disc space narrowing at C3-4, C5-6, C6-7. There is moderate disc space narrowing at other levels. There is multilevel facet hypertrophy. There is exit foraminal narrowing on the right due to bony hypertrophy at C5-6 with impression on the exiting nerve root. Similar changes are noted  to a slightly lesser degree on the left at C6-7. There is no disc extrusion or high-grade stenosis. Upper chest: Visualized upper lung regions are clear. Other: There is mild calcification in each carotid artery. IMPRESSION: CT head: Atrophy with periventricular small vessel disease. No evident acute infarct. No mass or hemorrhage. CT cervical spine: No fracture. Areas of slight spondylolisthesis likely due to underlying spondylosis. There is multilevel arthropathy without frank disc extrusion or stenosis. Foci of carotid artery calcification bilaterally noted. Electronically Signed   By: Lowella Grip III M.D.   On: 12/29/2020 08:09   DG Chest Portable 1 View  Result Date: 12/29/2020 CLINICAL DATA:  Weakness. EXAM: PORTABLE CHEST 1 VIEW COMPARISON:  No prior. FINDINGS: Heart size normal. Pulmonary vascularity normal. Low lung volumes. Mild bibasilar subsegmental atelectasis. No pleural effusion or pneumothorax. Degenerative changes scoliosis thoracic spine. IMPRESSION: Low lung volumes with mild bibasilar subsegmental atelectasis. Electronically Signed   By: Marcello Moores  Register   On: 12/29/2020 07:25    Medications:   . buPROPion  300 mg Oral Daily  . citalopram  20 mg Oral Daily  . enoxaparin (LOVENOX) injection  40 mg Subcutaneous Q24H  . insulin aspart  0-9 Units Subcutaneous TID WC   Continuous Infusions: . sodium chloride 75 mL/hr at 12/30/20 0629  . cefTRIAXone (ROCEPHIN)  IV 1 g (12/30/20 0932)     LOS: 0 days   Geradine Girt  [facility]   How to contact the [facility] Attending or Consulting provider Edith Endave or covering provider during after hours [place], for this patient?  1. Check the care team in [facility] and look for a) attending/consulting TRH provider listed and b) the [facility] team listed 2. Log into [email or web address] and use [place]'s universal password to access. If you do not have the password, please contact the hospital operator. 3. Locate the [facility] provider you are looking for  under [facility] and page to a number that you can be directly reached. 4. If you still have difficulty reaching the provider, please page the [facility] (Director on Call) for the Hospitalists listed on amion for assistance.  12/30/2020, 10:11 AM

## 2020-12-30 NOTE — Evaluation (Addendum)
Occupational Therapy Evaluation Patient Details Name: Joanna Reed MRN: [id number] DOB: 11/06/1940 Today's Date: 12/30/2020    History of Present Illness Pt is an 80 y/o F with PMH: depression and anxiety who was found down by a neighbor. Presented to ED via EMS with confusion. Currently being treated for UTI.   Clinical Impression   Pt seen for OT evaluation this date in setting of acute hospitalization d/t  UTI. Pt reports being INDEP at baseline and chart indicates she lives alone in private residence. Pt presents this date with moderate confusion and anxiety requiring gentle re-direction throughout session as well as cues to sequence all tasks and increased processing time. Pt requires CGA for ADL transfers with RW and RW for fxl mobility in the room for balance. Pt requires cues and SETUP for seated UB ADLs, CGA for standing UB ADLs and MIN A for seated LB ADLs. Pt with decreased safety awareness and motor planning/task sequencing as well as decreased balance and safety with transfers. Overall, anticipate pt will require STR in SNF setting upon d/c from acute setting.     Follow Up Recommendations  SNF    Equipment Recommendations  Other (comment) (defer to next venue of care)    Recommendations for Other Services       Precautions / Restrictions Precautions Precautions: Fall Restrictions Weight Bearing Restrictions: No      Mobility Bed Mobility               General bed mobility comments: pt seated EOB when OT presents with sitter taking VS    Transfers Overall transfer level: Needs assistance Equipment used: Rolling walker (2 wheeled) Transfers: Sit to/from [facility] Sit to Stand: Min guard Stand pivot transfers: Min guard            Balance Overall balance assessment: Needs assistance   Sitting balance-Leahy Scale: Good       Standing balance-Leahy Scale: Fair Standing balance comment: UE support with RW                            ADL either performed or assessed with clinical judgement   ADL Overall ADL's : Needs assistance/impaired                                       General ADL Comments: Pt requires cues to sequence all tasks. Requires MIN A for seated/standing LB ADLs, CGA for standing UB ADLs, SETUP for LB ADLs.     Vision Patient Visual Report: No change from baseline Additional Comments: unable to formally assess, but pt appears to track therapist appropriately     Perception     Praxis      Pertinent Vitals/Pain Pain Assessment: Faces Faces Pain Scale: No hurt     Hand Dominance     Extremity/Trunk Assessment Upper Extremity Assessment Upper Extremity Assessment: Generalized weakness   Lower Extremity Assessment Lower Extremity Assessment: Generalized weakness       Communication Communication Communication: No difficulties   Cognition Arousal/Alertness: Awake/alert Behavior During Therapy: WFL for tasks assessed/performed Overall Cognitive Status: No family/caregiver present to determine baseline cognitive functioning                                 General Comments: ED triage note states that pt's  baselie is A&O x4 with come confusion. Pt presents this date severely confused. She is only oriented to self and year (with cues) and is otherwise persevartive on leaving and worried about a pet which she states is a dog at one point and later states is actually a cat. Requires gentle re-direction throughout. Follows ~80% of simple one step commands with increased processing time.   General Comments       Exercises     Shoulder Instructions      Home Living Family/patient expects to be discharged to:: Private residence Living Arrangements: Alone Available Help at Discharge: Neighbor;Available PRN/intermittently (Carolyn-pt mentions and is listed as pt's primary contract)                             Additional Comments: pt is  poor historian d/t confusion, states she does not use walker, but uses shower chair and BSC, attempted to call both listed contacts in chart to no avail.      Prior Functioning/Environment          Comments: Pt reports being INDEP with all self care at baseline, but she is currently poor dhistorian 2/2/ confusion        OT Problem List: Decreased strength;Decreased activity tolerance;Decreased cognition;Decreased safety awareness;Decreased knowledge of use of DME or AE      OT Treatment/Interventions: Self-care/ADL training;Therapeutic exercise;DME and/or AE instruction;Therapeutic activities;Balance training;Patient/family education    OT Goals(Current goals can be found in the care plan section) Acute Rehab OT Goals Patient Stated Goal: to go home and see my cat OT Goal Formulation: With patient Time For Goal Achievement: 01/13/21 Potential to Achieve Goals: Good ADL Goals Pt Will Perform Grooming: with set-up;with supervision;standing (sink side with LRAD) Pt Will Perform Lower Body Dressing: with supervision;sitting/lateral leans (with AE PRN) Pt Will Transfer to Toilet: with supervision;ambulating;bedside commode Pt Will Perform Toileting - Clothing Manipulation and hygiene: with supervision;sit to/from stand Pt/caregiver will Perform Home Exercise Program: Increased strength;Both right and left upper extremity;With minimal assist  OT Frequency: Min 1X/week   Barriers to D/C:            Co-evaluation              AM-PAC OT "6 Clicks" Daily Activity     Outcome Measure Help from another person eating meals?: A Little Help from another person taking care of personal grooming?: A Little Help from another person toileting, which includes using toliet, bedpan, or urinal?: A Little Help from another person bathing (including washing, rinsing, drying)?: A Little Help from another person to put on and taking off regular upper body clothing?: A Little Help from another  person to put on and taking off regular lower body clothing?: A Little 6 Click Score: 18   End of Session Equipment Utilized During Treatment: Gait belt;Rolling walker Nurse Communication: Mobility status;Other (comment) (notified RN and TOC team about pt performance and pt confusion and concern about pet.)  Activity Tolerance: Patient tolerated treatment well Patient left: in chair;with call bell/phone within reach;with nursing/sitter in room (headstart posey for chair is present in the room, but not applied/turned on as pt has sitter present in room.)  OT Visit Diagnosis: Unsteadiness on feet (R26.81);Muscle weakness (generalized) (M62.81)                Time: 1610-9604 OT Time Calculation (min): 43 min Charges:  OT General Charges $OT Visit: 1 Visit OT Evaluation $OT Eval Moderate  Complexity: 1 Mod OT Treatments $Self Care/Home Management : 23-37 mins $Therapeutic Activity: 8-22 mins  Gerrianne Scale, MS, OTR/L ascom [phone] 12/30/20, 10:24 AM

## 2020-12-31 DIAGNOSIS — R4189 Other symptoms and signs involving cognitive functions and awareness: Secondary | ICD-10-CM

## 2020-12-31 DIAGNOSIS — F4322 Adjustment disorder with anxiety: Secondary | ICD-10-CM

## 2020-12-31 DIAGNOSIS — F0390 Unspecified dementia without behavioral disturbance: Secondary | ICD-10-CM

## 2020-12-31 DIAGNOSIS — F341 Dysthymic disorder: Secondary | ICD-10-CM

## 2020-12-31 DIAGNOSIS — F411 Generalized anxiety disorder: Secondary | ICD-10-CM

## 2020-12-31 LAB — BASIC METABOLIC PANEL
Anion gap: 6 (ref 5–15)
BUN: 7 mg/dL — ABNORMAL LOW (ref 8–23)
CO2: 23 mmol/L (ref 22–32)
Calcium: 8.1 mg/dL — ABNORMAL LOW (ref 8.9–10.3)
Chloride: 111 mmol/L (ref 98–111)
Creatinine, Ser: 0.59 mg/dL (ref 0.44–1.00)
GFR, Estimated: >60 mL/min (ref >60)
Glucose, Bld: 87 mg/dL (ref 70–99)
Potassium: 3.6 mmol/L (ref 3.5–5.1)
Sodium: 140 mmol/L (ref 135–145)

## 2020-12-31 LAB — GLUCOSE, CAPILLARY
Glucose-Capillary: 89 mg/dL (ref 70–99)
Glucose-Capillary: 85 mg/dL (ref 70–99)

## 2020-12-31 LAB — URINE CULTURE: Culture: >=100000 — AB

## 2020-12-31 MED ORDER — CEFAZOLIN SODIUM-DEXTROSE 1-4 GM/50ML-% IV SOLN
1.0000 g | Freq: Three times a day (TID) | INTRAVENOUS | Status: DC
  Administered 2021-01-01: 1 g via INTRAVENOUS
  Filled 2020-12-31 (×3): qty 50

## 2020-12-31 NOTE — TOC Progression Note (Signed)
Transition of Care [facility]) - Progression Note    Patient Details  Name: Joanna Reed MRN: [id number] Date of Birth: November 19, 1940  Transition of Care [facility]) CM/SW [place], LCSW Phone Number: 12/31/2020, 10:29 AM  Clinical Narrative:   SNF work up started. TOC to follow up with bed offers. PASRR pending.    Expected Discharge Plan: [place] Barriers to Discharge: Continued Medical Work up  Expected Discharge Plan and Services Expected Discharge Plan: [facility] arrangements for the past 2 months: Single Family Home                                       Social Determinants of Health (SDOH) Interventions    Readmission Risk Interventions No flowsheet data found.

## 2020-12-31 NOTE — Progress Notes (Signed)
RE: Joanna Reed Date of Birth: 03/26/1941 Date: 12/31/2020   To Whom It May Concern:  Please be advised that the above-named patient will require a short-term nursing home stay - anticipated 30 days or less for rehabilitation and strengthening.  The plan is for return home.

## 2020-12-31 NOTE — Consult Note (Signed)
[facility] Consult   Reason for Consult: Consult for 80 year old woman admitted to the hospital after a fall with altered mental status.  Concern about anxiety and depression Referring Physician: Eliseo Squires Patient Identification: Joanna Reed MRN:  [id number] Principal Diagnosis: Adjustment disorder with anxiety Diagnosis:  Principal Problem:   Adjustment disorder with anxiety Active Problems:   Anxiety and depression   UTI (urinary tract infection)   Fall at home, initial encounter   Acute metabolic encephalopathy   Pressure injury of skin   Generalized anxiety disorder   Dysthymia   Cognitive impairment   Total Time spent with patient: 1 hour  Subjective:   Joanna Reed is a 80 y.o. female patient admitted with "I do not know for sure".  HPI: Patient seen chart reviewed.  80 year old woman who was found outside having fallen down with confusion and brought to the hospital.  Possible urinary tract infection.  Patient has recovered substantially since coming into the hospital.  On interview today the patient was awake and cooperative.  She clearly is having some cognitive impairment with difficulty in word finding and language fluency.  She is aware of this and it bothers her and she feels anxious and self-conscious about it.  She denies however feeling in general any more depressed and anxious than usual.  She has some chronic things that she feels sad about like being separated from her grandchildren who live in [place] and some grief over the aging process but generally says she has positive things in her life to live for.  Denies any hallucinations.  Tried to get her to talk about any kind of paranoid thinking but was not able to really get her to understand what I was saying, nevertheless did not sound like she was having any paranoid symptoms.  Patient absolutely denies any suicidal ideation.  She reports that she has been compliant with her longstanding psychiatric  medicines of citalopram and bupropion.  Denies alcohol or drug use.  Patient admits that she has been aware that her cognitive problems have been more pronounced over the last several months.  She admits that she has considered the possibility of moving to a assisted living facility but has relied greatly on friends and neighbors.  Past Psychiatric History: Patient has a longstanding history of anxiety and depression and has diagnoses in her chart and has been on citalopram and Wellbutrin for a long time.  Tried to get a clear feeling for whether it had been much worse in the past and I was never really able to be completely certain about that.  I do not think she is ever had any suicide attempts or hospitalization.  Risk to Self:   Risk to Others:   Prior Inpatient Therapy:   Prior Outpatient Therapy:    Past Medical History:  Past Medical History:  Diagnosis Date  . Anxiety and depression     Past Surgical History:  Procedure Laterality Date  . ABDOMINAL HYSTERECTOMY     1980   Family History:  Family History  Problem Relation Age of Onset  . Arthritis Mother   . Heart disease Father        Deceased from MI  . Stroke Maternal Grandmother 85  . Arthritis Maternal Grandfather    Family Psychiatric  History: None reported Social History:  Social History   Substance and Sexual Activity  Alcohol Use Yes  . Alcohol/week: 0.0 standard drinks   Comment: occ     Social History   Substance  and Sexual Activity  Drug Use No    Social History   Socioeconomic History  . Marital status: Divorced    Spouse name: Not on file  . Number of children: Not on file  . Years of education: Not on file  . Highest education level: Not on file  Occupational History  . Not on file  Tobacco Use  . Smoking status: Never Smoker  . Smokeless tobacco: Never Used  Substance and Sexual Activity  . Alcohol use: Yes    Alcohol/week: 0.0 standard drinks    Comment: occ  . Drug use: No  .  Sexual activity: Not on file  Other Topics Concern  . Not on file  Social History Narrative   Enjoys traveling   Daughter lives in [place] with her husband   Has 2 grandchildren in [place] (boys, 2,4)   Social Determinants of Health   Financial Resource Strain: Not on file  Food Insecurity: Not on file  Transportation Needs: Not on file  Physical Activity: Not on file  Stress: Not on file  Social Connections: Not on file   Additional Social History:    Allergies:   Allergies  Allergen Reactions  . Sulfa Antibiotics     Labs:  Results for orders placed or performed during the hospital encounter of 12/29/20 (from the past 48 hour(s))  TSH     Status: None   Collection Time: 12/29/20  7:25 PM  Result Value Ref Range   TSH 2.430 0.350 - 4.500 uIU/mL    Comment: Performed by a 3rd Generation assay with a functional sensitivity of <=0.01 uIU/mL. Performed at [facility], [address] [place], [place] [address]   Vitamin B12     Status: None   Collection Time: 12/29/20  7:25 PM  Result Value Ref Range   Vitamin B-12 261 180 - 914 pg/mL    Comment: (NOTE) This assay is not validated for testing neonatal or myeloproliferative syndrome specimens for Vitamin B12 levels. Performed at [facility], [place] [address], [place], [place] [address]   Basic metabolic panel     Status: Abnormal   Collection Time: 12/30/20  5:32 AM  Result Value Ref Range   Sodium 142 135 - 145 mmol/L   Potassium 3.3 (L) 3.5 - 5.1 mmol/L   Chloride 109 98 - 111 mmol/L   CO2 23 22 - 32 mmol/L   Glucose, Bld 80 70 - 99 mg/dL    Comment: Glucose reference range applies only to samples taken after fasting for at least 8 hours.   BUN 10 8 - 23 mg/dL   Creatinine, Ser 0.69 0.44 - 1.00 mg/dL   Calcium 8.2 (L) 8.9 - 10.3 mg/dL   GFR, Estimated >60 >60 mL/min    Comment: (NOTE) Calculated using the CKD-EPI Creatinine Equation (2021)    Anion gap 10 5 - 15    Comment: Performed at  [facility], [address] [place], [place] [address]  CBC     Status: Abnormal   Collection Time: 12/30/20  5:32 AM  Result Value Ref Range   WBC 6.9 4.0 - 10.5 K/uL   RBC 3.76 (L) 3.87 - 5.11 MIL/uL   Hemoglobin 11.0 (L) 12.0 - 15.0 g/dL   HCT 33.8 (L) 36.0 - 46.0 %   MCV 89.9 80.0 - 100.0 fL   MCH 29.3 26.0 - 34.0 pg   MCHC 32.5 30.0 - 36.0 g/dL   RDW 12.5 11.5 - 15.5 %  Platelets 261 150 - 400 K/uL   nRBC 0.0 0.0 - 0.2 %    Comment: Performed at [facility], [address] [place], [place] [address]  CK     Status: Abnormal   Collection Time: 12/30/20  5:32 AM  Result Value Ref Range   Total CK 274 (H) 38 - 234 U/L    Comment: Performed at [facility], [place],  [address]  Hemoglobin A1c     Status: None   Collection Time: 12/30/20  5:32 AM  Result Value Ref Range   Hgb A1c MFr Bld 5.3 4.8 - 5.6 %    Comment: (NOTE) Pre diabetes:          5.7%-6.4%  Diabetes:              >6.4%  Glycemic control for   <7.0% adults with diabetes    Mean Plasma Glucose 105.41 mg/dL    Comment: Performed at [place] [address], [place], [place] [address]  Glucose, capillary     Status: Abnormal   Collection Time: 12/30/20  8:55 AM  Result Value Ref Range   Glucose-Capillary 250 (H) 70 - 99 mg/dL    Comment: Glucose reference range applies only to samples taken after fasting for at least 8 hours.  Glucose, capillary     Status: None   Collection Time: 12/30/20 12:39 PM  Result Value Ref Range   Glucose-Capillary 77 70 - 99 mg/dL    Comment: Glucose reference range applies only to samples taken after fasting for at least 8 hours.  Glucose, capillary     Status: Abnormal   Collection Time: 12/30/20  4:34 PM  Result Value Ref Range   Glucose-Capillary 105 (H) 70 - 99 mg/dL    Comment: Glucose reference range applies only to samples taken after fasting for at least 8 hours.  Glucose, capillary     Status: None    Collection Time: 12/30/20  7:32 PM  Result Value Ref Range   Glucose-Capillary 72 70 - 99 mg/dL    Comment: Glucose reference range applies only to samples taken after fasting for at least 8 hours.  Glucose, capillary     Status: None   Collection Time: 12/30/20 11:52 PM  Result Value Ref Range   Glucose-Capillary 85 70 - 99 mg/dL    Comment: Glucose reference range applies only to samples taken after fasting for at least 8 hours.  Glucose, capillary     Status: None   Collection Time: 12/31/20  3:44 AM  Result Value Ref Range   Glucose-Capillary 89 70 - 99 mg/dL    Comment: Glucose reference range applies only to samples taken after fasting for at least 8 hours.  Basic metabolic panel     Status: Abnormal   Collection Time: 12/31/20  5:12 AM  Result Value Ref Range   Sodium 140 135 - 145 mmol/L   Potassium 3.6 3.5 - 5.1 mmol/L   Chloride 111 98 - 111 mmol/L   CO2 23 22 - 32 mmol/L   Glucose, Bld 87 70 - 99 mg/dL    Comment: Glucose reference range applies only to samples taken after fasting for at least 8 hours.   BUN 7 (L) 8 - 23 mg/dL   Creatinine, Ser 0.59 0.44 - 1.00 mg/dL   Calcium 8.1 (L) 8.9 - 10.3 mg/dL   GFR, Estimated >60 >60 mL/min    Comment: (NOTE) Calculated using the CKD-EPI Creatinine  Equation (2021)    Anion gap 6 5 - 15    Comment: Performed at [facility], [address] [place],  [address]  Glucose, capillary     Status: None   Collection Time: 12/31/20  7:21 AM  Result Value Ref Range   Glucose-Capillary 85 70 - 99 mg/dL    Comment: Glucose reference range applies only to samples taken after fasting for at least 8 hours.   Comment 1 Notify RN    Comment 2 Document in Chart     Current Facility-Administered Medications  Medication Dose Route Frequency Provider Last Rate Last Admin  . acetaminophen (TYLENOL) tablet 650 mg  650 mg Oral Q6H PRN Ivor Costa, MD      . buPROPion (WELLBUTRIN XL) 24 hr tablet 300 mg  300 mg Oral Daily Ivor Costa, MD   300 mg at 12/31/20 0802  . [START ON 01/01/2021] ceFAZolin (ANCEF) IVPB 1 g/50 mL premix  1 g Intravenous Q8H Vann, Jessica U, DO      . citalopram (CELEXA) tablet 20 mg  20 mg Oral Daily Ivor Costa, MD   20 mg at 12/31/20 0802  . enoxaparin (LOVENOX) injection 40 mg  40 mg Subcutaneous Q24H Ivor Costa, MD   40 mg at 12/31/20 8676  . haloperidol (HALDOL) tablet 2 mg  2 mg Oral Q6H PRN Eulogio Bear U, DO   2 mg at 12/31/20 1419  . hydrOXYzine (ATARAX/VISTARIL) tablet 25 mg  25 mg Oral QHS PRN Ivor Costa, MD      . ondansetron [facility]) tablet 4 mg  4 mg Oral Q6H PRN Ivor Costa, MD      . vitamin B-12 (CYANOCOBALAMIN) tablet 500 mcg  500 mcg Oral Daily Eulogio Bear U, DO   500 mcg at 12/31/20 0802    Musculoskeletal: Strength & Muscle Tone: Did not test Gait & Station: Patient says she has gotten out of bed and been able to walk today but I did not test it Patient leans: N/A            Psychiatric Specialty Exam:  Presentation  General Appearance: No data recorded Eye Contact:No data recorded Speech:No data recorded Speech Volume:No data recorded Handedness:No data recorded  Mood and Affect  Mood:No data recorded Affect:No data recorded  Thought Process  Thought Processes:No data recorded Descriptions of Associations:No data recorded Orientation:No data recorded Thought Content:No data recorded History of Schizophrenia/Schizoaffective disorder:No data recorded Duration of Psychotic Symptoms:No data recorded Hallucinations:No data recorded Ideas of Reference:No data recorded Suicidal Thoughts:No data recorded Homicidal Thoughts:No data recorded  Sensorium  Memory:No data recorded Judgment:No data recorded Insight:No data recorded  Executive Functions  Concentration:No data recorded Attention Span:No data recorded Recall:No data recorded Fund of Knowledge:No data recorded Language:No data recorded  Psychomotor Activity  Psychomotor Activity:No  data recorded  Assets  Assets:No data recorded  Sleep  Sleep:No data recorded  Physical Exam: Physical Exam Vitals and nursing note reviewed.  Constitutional:      Appearance: Normal appearance.  HENT:     Head: Normocephalic and atraumatic.     Mouth/Throat:     Pharynx: Oropharynx is clear.  Eyes:     Pupils: Pupils are equal, round, and reactive to light.  Cardiovascular:     Rate and Rhythm: Normal rate and regular rhythm.  Pulmonary:     Effort: Pulmonary effort is normal.     Breath sounds: Normal breath sounds.  Abdominal:     General: Abdomen is flat.  Palpations: Abdomen is soft.  Musculoskeletal:        General: Normal range of motion.  Skin:    General: Skin is warm and dry.  Neurological:     General: No focal deficit present.     Mental Status: She is alert. Mental status is at baseline.  Psychiatric:        Attention and Perception: She is inattentive.        Mood and Affect: Mood is anxious.        Speech: Speech is delayed and tangential.        Behavior: Behavior is cooperative.        Thought Content: Thought content normal.        Cognition and Memory: Cognition is impaired. Memory is impaired.    Review of Systems  Constitutional: Negative.   HENT: Negative.   Eyes: Negative.   Respiratory: Negative.   Cardiovascular: Negative.   Gastrointestinal: Negative.   Musculoskeletal: Negative.   Skin: Negative.   Neurological: Negative.   Psychiatric/Behavioral: Positive for memory loss. Negative for depression, hallucinations, substance abuse and suicidal ideas. The patient is nervous/anxious.    Blood pressure 138/70, pulse 87, temperature 97.6 F (36.4 C), resp. rate 18, height 5\' 2"  (1.575 m), weight 59 kg, SpO2 98 %. Body mass index is 23.78 kg/m.  Treatment Plan Summary: Plan 80 year old woman with a history of anxiety and depression who now he is clearly having some cognitive problems.  I did not perform a full [place] cognitive  examination but the degree of difficulty that she is having shows at least some mild symptoms that may or may not qualify as mild dementia.  Patient is aware of it and bothered by it.  As far as anxiety and depression these appear to be longstanding chronic things and not any worse than usual.  Medicine appears to be well-tolerated.  I would not recommend changing anything about her medication.  It appears that she is going to be admitted to skilled nursing or rehab level care after hospitalization which will give her a longer time to recover and for her and her family to examine whether she is off of her baseline.  Disposition: No evidence of imminent risk to self or others at present.   Patient does not meet criteria for psychiatric inpatient admission. Supportive therapy provided about ongoing stressors.  Alethia Berthold, MD 12/31/2020 6:06 PM

## 2020-12-31 NOTE — Progress Notes (Addendum)
Progress Note    Joanna Reed  HQI:[id number] DOB: [birth year]  DOA: 12/29/2020 PCP: Pleas Koch, NP    Brief Narrative:     Medical records reviewed and are as summarized below:  Joanna Reed is an 80 y.o. female with medical history significant of depression with anxiety, who presents with altered mental status and fall.  Found to have a UTI.    Daughter lives in [place]-- says her mother has not seen a doctor in 2 years.  Has always had some paranoia but this has worsened over the last 1 year.    Assessment/Plan:   Principal Problem:   UTI (urinary tract infection) Active Problems:   Anxiety and depression   Fall at home, initial encounter   Acute metabolic encephalopathy   Pressure injury of skin     UTI (urinary tract infection)-e coli- pan sensitive -Rocephin IV -f/u Bx  Anxiety and depression -Wellbutrin, Celexa  Fall at home, initial encounter: CT of the head and CT of C-spine negative. -PT/OT   -consult transition care team for possible SNF placement  Acute metabolic encephalopathy: Likely due to UTI but not sure of patient's baseline - TSH normal, vitamin B12 low normal, vitamin B1 level pending -? Psych consult-- geri-psych? -CT head shows atrophy  Hyperglycemia -due to infection -much improved  Hypokalemia -repleted   Pressure Injury 12/29/20 Sacrum Medial Stage 2 -  Partial thickness loss of dermis presenting as a shallow open injury with a red, pink wound bed without slough. stage 2 pressure injury on sacrum with erythema surrounding (Active)  12/29/20 1640  Location: Sacrum  Location Orientation: Medial  Staging: Stage 2 -  Partial thickness loss of dermis presenting as a shallow open injury with a red, pink wound bed without slough.  Wound Description (Comments): stage 2 pressure injury on sacrum with erythema surrounding  Present on Admission: Yes    Probable dementia -hallucinations reported by  daughter -?geripsych benefit? -will need formal evaluation as an outpatient with neurology    Family Communication/Anticipated D/C date and plan/Code Status   DVT prophylaxis: Lovenox ordered. Code Status: Full Code.  Daughter to call unit from [place] at [place]: Status is: inpt  The patient will require care spanning > 2 midnights and should be moved to inpatient because: Inpatient level of care appropriate due to severity of illness  Dispo: The patient is from: Home              Anticipated d/c is to: SNF              Patient currently is not medically stable to d/c.   Difficult to place patient No    Medical Consultants:    None.    Subjective:   Worried about her "dog" but then corrects to "cat"  Objective:    Vitals:   12/30/20 2350 12/31/20 0342 12/31/20 0748 12/31/20 1137  BP: 128/66 (!) 142/74 127/81 (!) 141/66  Pulse: 73 80 76 85  Resp: 19 19 18 17   Temp: 97.8 F (36.6 C) (!) 97.5 F (36.4 C) (!) 97.4 F (36.3 C) (!) 97.5 F (36.4 C)  TempSrc:      SpO2: 97% 98% 98% 97%  Weight:      Height:        Intake/Output Summary (Last 24 hours) at 12/31/2020 1143 Last data filed at 12/31/2020 1030 Gross per 24 hour  Intake 2144.58 ml  Output --  Net 2144.58 ml   [employer]  12/29/20 0634  Weight: 59 kg    Exam:  General: Appearance:    elderly female in no acute distress in bed     Lungs:     respirations unlabored  Heart:    Normal heart rate. Normal rhythm. No murmurs, rubs, or gallops.   MS:   All extremities are intact.   Neurologic:   Awake, alert, rambles and has some work finding difficulty      Data Reviewed:   I have personally reviewed following labs and imaging studies:  Labs: Labs show the following:   Basic Metabolic Panel: Recent Labs  Lab 12/29/20 0639 12/30/20 0532 12/31/20 0512  NA 141 142 140  K 3.5 3.3* 3.6  CL 108 109 111  CO2 24 23 23   GLUCOSE 89 80 87  BUN 17 10 7*  CREATININE 0.68  0.69 0.59  CALCIUM 9.0 8.2* 8.1*   GFR Estimated Creatinine Clearance: 44.4 mL/min (by C-G formula based on SCr of 0.59 mg/dL). Liver Function Tests: Recent Labs  Lab 12/29/20 0639  AST 28  ALT 20  ALKPHOS 38  BILITOT 1.0  PROT 6.4*  ALBUMIN 3.8   No results for input(s): LIPASE, AMYLASE in the last 168 hours. No results for input(s): AMMONIA in the last 168 hours. Coagulation profile No results for input(s): INR, PROTIME in the last 168 hours.  CBC: Recent Labs  Lab 12/29/20 0639 12/30/20 0532  WBC 11.0* 6.9  NEUTROABS 8.3*  --   HGB 12.1 11.0*  HCT 36.6 33.8*  MCV 88.4 89.9  PLT 268 261   Cardiac Enzymes: Recent Labs  Lab 12/29/20 0639 12/30/20 0532  CKTOTAL 509* 274*   BNP (last 3 results) No results for input(s): PROBNP in the last 8760 hours. CBG: Recent Labs  Lab 12/30/20 1634 12/30/20 1932 12/30/20 2352 12/31/20 0344 12/31/20 0721  GLUCAP 105* 72 85 89 85   D-Dimer: No results for input(s): DDIMER in the last 72 hours. Hgb A1c: Recent Labs    12/30/20 0532  HGBA1C 5.3   Lipid Profile: No results for input(s): CHOL, HDL, LDLCALC, TRIG, CHOLHDL, LDLDIRECT in the last 72 hours. Thyroid function studies: Recent Labs    12/29/20 1925  TSH 2.430   Anemia work up: Recent Labs    12/29/20 1925  VITAMINB12 261   Sepsis Labs: Recent Labs  Lab 12/29/20 0639 12/30/20 0532  WBC 11.0* 6.9    Microbiology Recent Results (from the past 240 hour(s))  Urine Culture     Status: Abnormal   Collection Time: 12/29/20  6:44 AM   Specimen: Urine, Random  Result Value Ref Range Status   Specimen Description   Final    URINE, RANDOM Performed at [facility], [address] [place], [place] [address]    Special Requests   Final    NONE Performed at [facility], [address] [place], [place] [address]    Culture >=100,000 COLONIES/mL ESCHERICHIA COLI (A)  Final   Report Status 12/31/2020 FINAL  Final   Organism ID,  Bacteria ESCHERICHIA COLI (A)  Final      Susceptibility   Escherichia coli - MIC*    AMPICILLIN 4 SENSITIVE Sensitive     CEFAZOLIN <=4 SENSITIVE Sensitive     CEFEPIME <=0.12 SENSITIVE Sensitive     CEFTRIAXONE <=0.25 SENSITIVE Sensitive     CIPROFLOXACIN <=0.25 SENSITIVE Sensitive     GENTAMICIN <=1 SENSITIVE Sensitive     IMIPENEM <=0.25 SENSITIVE Sensitive     NITROFURANTOIN <=  16 SENSITIVE Sensitive     TRIMETH/SULFA <=20 SENSITIVE Sensitive     AMPICILLIN/SULBACTAM <=2 SENSITIVE Sensitive     PIP/TAZO <=4 SENSITIVE Sensitive     * >=100,000 COLONIES/mL ESCHERICHIA COLI  SARS CORONAVIRUS 2 (TAT 6-24 HRS) Nasopharyngeal Nasopharyngeal Swab     Status: None   Collection Time: 12/29/20  7:32 AM   Specimen: Nasopharyngeal Swab  Result Value Ref Range Status   SARS Coronavirus 2 NEGATIVE NEGATIVE Final    Comment: (NOTE) SARS-CoV-2 target nucleic acids are NOT DETECTED.  The SARS-CoV-2 RNA is generally detectable in upper and lower respiratory specimens during the acute phase of infection. Negative results do not preclude SARS-CoV-2 infection, do not rule out co-infections with other pathogens, and should not be used as the sole basis for treatment or other patient management decisions. Negative results must be combined with clinical observations, patient history, and epidemiological information. The expected result is Negative.  Fact Sheet for Patients: SugarRoll.be  Fact Sheet for Healthcare Providers: https://www.woods-mathews.com/  This test is not yet approved or cleared by the [place] FDA and  has been authorized for detection and/or diagnosis of SARS-CoV-2 by FDA under an Emergency Use Authorization (EUA). This EUA will remain  in effect (meaning this test can be used) for the duration of the COVID-19 declaration under Se ction 564(b)(1) of the Act, 21 U.S.C. section 360bbb-3(b)(1), unless the authorization is terminated  or revoked sooner.  Performed at [facility], [place] [address] [place], [place] [address]   Culture, blood (Routine X 2) w Reflex to ID Panel     Status: None (Preliminary result)   Collection Time: 12/29/20 11:21 AM   Specimen: BLOOD  Result Value Ref Range Status   Specimen Description BLOOD LEFT ANTECUBITAL  Final   Special Requests   Final    BOTTLES DRAWN AEROBIC AND ANAEROBIC Blood Culture adequate volume   Culture   Final    NO GROWTH 2 DAYS Performed at [facility], [address] [place], [place] [address]    Report Status PENDING  Incomplete  Culture, blood (Routine X 2) w Reflex to ID Panel     Status: None (Preliminary result)   Collection Time: 12/29/20 11:21 AM   Specimen: BLOOD  Result Value Ref Range Status   Specimen Description BLOOD RIGHT FOREARM  Final   Special Requests   Final    BOTTLES DRAWN AEROBIC AND ANAEROBIC Blood Culture results may not be optimal due to an excessive volume of blood received in culture bottles   Culture   Final    NO GROWTH 2 DAYS Performed at [facility], [address], [place], [place] [address]    Report Status PENDING  Incomplete    Procedures and diagnostic studies:  No results found.  Medications:   . buPROPion  300 mg Oral Daily  . citalopram  20 mg Oral Daily  . enoxaparin (LOVENOX) injection  40 mg Subcutaneous Q24H  . vitamin B-12  500 mcg Oral Daily   Continuous Infusions: . sodium chloride 75 mL/hr at 12/31/20 1126  . cefTRIAXone (ROCEPHIN)  IV 1 g (12/31/20 0803)     LOS: 1 day   Geradine Girt  [facility]   How to contact the [facility] Attending or Consulting provider [place] or covering provider during after hours [place], for this patient?  1. Check the care team in [facility] and look for a) attending/consulting TRH provider listed and b) the [facility] team  listed 2. Log into [email or web address] and use [place]'s universal password to access. If you do not have the password, please  contact the hospital operator. 3. Locate the [facility] provider you are looking for under [facility] and page to a number that you can be directly reached. 4. If you still have difficulty reaching the provider, please page the [place] [facility] (Director on Call) for the Hospitalists listed on amion for assistance.  12/31/2020, 11:43 AM

## 2020-12-31 NOTE — Plan of Care (Addendum)
Pt AOx1, unable to answer questions with direct answers, frequently off tangent and nonsensical. NS infusing per MAR. 1:1 sitter maintained. Adequate UO and x1 BM overnight. Fall/safety precautions in place, rounding performed, needs/concerns addressed during shift.   Problem: Education: Goal: Knowledge of General Education information will improve Description: Including pain rating scale, medication(s)/side effects and non-pharmacologic comfort measures Outcome: Progressing   Problem: Health Behavior/Discharge Planning: Goal: Ability to manage health-related needs will improve Outcome: Progressing   Problem: Clinical Measurements: Goal: Ability to maintain clinical measurements within normal limits will improve Outcome: Progressing Goal: Will remain free from infection Outcome: Progressing Goal: Diagnostic test results will improve Outcome: Progressing Goal: Respiratory complications will improve Outcome: Progressing Goal: Cardiovascular complication will be avoided Outcome: Progressing   Problem: Activity: Goal: Risk for activity intolerance will decrease Outcome: Progressing   Problem: Nutrition: Goal: Adequate nutrition will be maintained Outcome: Progressing   Problem: Coping: Goal: Level of anxiety will decrease Outcome: Progressing   Problem: Elimination: Goal: Will not experience complications related to bowel motility Outcome: Progressing Goal: Will not experience complications related to urinary retention Outcome: Progressing   Problem: Pain Managment: Goal: General experience of comfort will improve Outcome: Progressing   Problem: Safety: Goal: Ability to remain free from injury will improve Outcome: Progressing   Problem: Skin Integrity: Goal: Risk for impaired skin integrity will decrease Outcome: Progressing

## 2020-12-31 NOTE — NC FL2 (Signed)
[place] LEVEL OF CARE SCREENING TOOL     IDENTIFICATION  Patient Name: Joanna Reed Birthdate: 19-Aug-1941 Sex: female Admission Date (Current Location): 12/29/2020  [place] and [place] Number:  Engineering geologist and Address:  [facility], [address], [place], [place] [address]      Provider Number: [id number]  Attending Physician Name and Address:  Geradine Girt, DO  Relative Name and Phone Number:  [name] [phone]    Current Level of Care: Hospital Recommended Level of Care: [place] Prior Approval Number:    Date Approved/Denied:   PASRR Number: pending  Discharge Plan: SNF    Current Diagnoses: Patient Active Problem List   Diagnosis Date Noted  . UTI (urinary tract infection) 12/29/2020  . Fall at home, initial encounter 12/29/2020  . Acute metabolic encephalopathy 73/22/0254  . Pressure injury of skin 12/29/2020  . Preventative health care 03/19/2018  . Chronic knee pain 02/27/2017  . Medicare annual wellness visit, subsequent 01/28/2015  . Anxiety and depression 01/19/2015  . Acute shoulder pain 01/19/2015    Orientation RESPIRATION BLADDER Height & Weight     Self  Normal Incontinent Weight: 130 lb (59 kg) Height:  5\' 2"  (157.5 cm)  BEHAVIORAL SYMPTOMS/MOOD NEUROLOGICAL BOWEL NUTRITION STATUS      Incontinent Diet (regular diet, thin liquids)  AMBULATORY STATUS COMMUNICATION OF NEEDS Skin   Limited Assist Verbally  (stage 2 sacrum)                       Personal Care Assistance Level of Assistance  Bathing,Feeding,Dressing Bathing Assistance: Limited assistance Feeding assistance: Limited assistance Dressing Assistance: Limited assistance     Functional Limitations Info             SPECIAL CARE FACTORS FREQUENCY  PT (By licensed PT),OT (By licensed OT)     PT Frequency: 5 x/week OT Frequency: 5 x/week            Contractures      Additional  Factors Info  Code Status,Allergies Code Status Info: full code Allergies Info: sulfa antibiotics           Current Medications (12/31/2020):  This is the current hospital active medication list Current Facility-Administered Medications  Medication Dose Route Frequency Provider Last Rate Last Admin  . 0.9 %  sodium chloride infusion   Intravenous Continuous Ivor Costa, MD 75 mL/hr at 12/30/20 0629 New Bag at 12/30/20 2706  . acetaminophen (TYLENOL) tablet 650 mg  650 mg Oral Q6H PRN Ivor Costa, MD      . buPROPion (WELLBUTRIN XL) 24 hr tablet 300 mg  300 mg Oral Daily Ivor Costa, MD   300 mg at 12/31/20 0802  . cefTRIAXone (ROCEPHIN) 1 g in sodium chloride 0.9 % 100 mL IVPB  1 g Intravenous Q24H Ivor Costa, MD 200 mL/hr at 12/31/20 0803 1 g at 12/31/20 0803  . citalopram (CELEXA) tablet 20 mg  20 mg Oral Daily Ivor Costa, MD   20 mg at 12/31/20 0802  . enoxaparin (LOVENOX) injection 40 mg  40 mg Subcutaneous Q24H Ivor Costa, MD   40 mg at 12/31/20 2376  . haloperidol (HALDOL) tablet 2 mg  2 mg Oral Q6H PRN Eulogio Bear U, DO   2 mg at 12/31/20 0802  . hydrOXYzine (ATARAX/VISTARIL) tablet 25 mg  25 mg Oral QHS PRN Ivor Costa, MD      . ondansetron [facility]) tablet 4 mg  4 mg Oral Q6H PRN Ivor Costa, MD      . vitamin B-12 (CYANOCOBALAMIN) tablet 500 mcg  500 mcg Oral Daily Eulogio Bear U, DO   500 mcg at 12/31/20 0802     Discharge Medications: Please see discharge summary for a list of discharge medications.  Relevant Imaging Results:  Relevant Lab Results:   Additional Information SS #: [id number]  [place], LCSW

## 2021-01-01 DIAGNOSIS — F4322 Adjustment disorder with anxiety: Secondary | ICD-10-CM

## 2021-01-01 LAB — GLUCOSE, CAPILLARY: Glucose-Capillary: 105 mg/dL — ABNORMAL HIGH (ref 70–99)

## 2021-01-01 MED ORDER — CEPHALEXIN 500 MG PO CAPS
500.0000 mg | ORAL_CAPSULE | Freq: Two times a day (BID) | ORAL | Status: AC
  Administered 2021-01-01 – 2021-01-03 (×5): 500 mg via ORAL
  Filled 2021-01-01 (×6): qty 1

## 2021-01-01 NOTE — Progress Notes (Signed)
Occupational Therapy Treatment Patient Details Name: Joanna Reed MRN: [id number] DOB: Apr 01, 1941 Today's Date: 01/01/2021    History of present illness Pt is an 80 y/o F with PMH: depression and anxiety who was found down by a neighbor. Presented to ED via EMS with confusion. Currently being treated for acute metabolic encephalopathy related to UTI.   OT comments  Pt seen for OT evaluation this date to f/u re: safety with ADLs/ADL mobility. OT facilitates pt participation in seated dressing tasks including UB and LB with MIN A and cues to sequence. In addition, OT engages pt in fxl mobility around duration of the nursing unit x3 laps to improve strength and tolerance for fxl mobility in home and community. Pt requires at least unilateral UE support and CGA for safety. Pt follows commands well, but remains disotiented and her agitation waxes and wanes throughout session. Pt left in bed with bed alarm and sitter present. RN notified of session. Will continue to follow. Continue to anticipate that pt will require STR in SNF setting upon d/c for strengthening, fall prevention, and safety.   Follow Up Recommendations  SNF    Equipment Recommendations  Other (comment) (defer)    Recommendations for Other Services      Precautions / Restrictions Precautions Precautions: Fall Restrictions Weight Bearing Restrictions: No       Mobility Bed Mobility Overal bed mobility: Modified Independent                  Transfers Overall transfer level: Needs assistance Equipment used: 1 person hand held assist Transfers: Sit to/from Stand Sit to Stand: Min guard;Min assist         General transfer comment: cues for safety, slightly impulsive, refuses walker use    Balance Overall balance assessment: Needs assistance Sitting-balance support: No upper extremity supported;Feet supported Sitting balance-Leahy Scale: Good     Standing balance support: Single extremity  supported Standing balance-Leahy Scale: Fair Standing balance comment: UE support at least unilaterally                           ADL either performed or assessed with clinical judgement   ADL Overall ADL's : Needs assistance/impaired                 Upper Body Dressing : Minimal assistance;Sitting Upper Body Dressing Details (indicate cue type and reason): to don gown to back side like robe in EOB sitting Lower Body Dressing: Minimal assistance;Sitting/lateral leans Lower Body Dressing Details (indicate cue type and reason): to pull up socks             Functional mobility during ADLs: Min guard;Minimal assistance (using HHA and IV pole for balance as pt refuses to use RW.)       Vision Patient Visual Report: No change from baseline     Perception     Praxis      Cognition Arousal/Alertness: Awake/alert Behavior During Therapy: WFL for tasks assessed/performed;Agitated Overall Cognitive Status: No family/caregiver present to determine baseline cognitive functioning                                 General Comments: pt more agitated this date, but her agitation waxes and wanes throughout. she is sometimes pleasant and compliant and other times accusatory and angry, primarily just wanting to leave and see her cat. OT has to gently re-direct VERY freqeuntly  this session. Pt is able to follow commands appropriately however, but remains disoriented.        Exercises Other Exercises Other Exercises: OT facilitates pt participation in seated dressing tasks including UB and LB with MIN A and cues to sequence. In addition, OT engages pt in fxl mobility around duration of the nursing unit x3 laps to improve strength and tolerance for fxl mobility in home and community. Pt follows commands well, but remains disotiented and her agitation waxes and wanes throughout session.   Shoulder Instructions       General Comments      Pertinent Vitals/  Pain       Pain Assessment: Faces Faces Pain Scale: No hurt  Home Living                                          Prior Functioning/Environment              Frequency  Min 1X/week        Progress Toward Goals  OT Goals(current goals can now be found in the care plan section)  Progress towards OT goals: Progressing toward goals  Acute Rehab OT Goals Patient Stated Goal: to go home and see my cat OT Goal Formulation: With patient Time For Goal Achievement: 01/13/21 Potential to Achieve Goals: Good  Plan Discharge plan remains appropriate    Co-evaluation                 AM-PAC OT "6 Clicks" Daily Activity     Outcome Measure   Help from another person eating meals?: A Little Help from another person taking care of personal grooming?: A Little Help from another person toileting, which includes using toliet, bedpan, or urinal?: A Little Help from another person bathing (including washing, rinsing, drying)?: A Little Help from another person to put on and taking off regular upper body clothing?: A Little Help from another person to put on and taking off regular lower body clothing?: A Little 6 Click Score: 18    End of Session Equipment Utilized During Treatment: Gait belt  OT Visit Diagnosis: Unsteadiness on feet (R26.81);Muscle weakness (generalized) (M62.81)   Activity Tolerance Patient tolerated treatment well   Patient Left with call bell/phone within reach;with nursing/sitter in room;in bed;with bed alarm set   Nurse Communication Mobility status;Other (comment)        Time: 9242-6834 OT Time Calculation (min): 45 min  Charges: OT General Charges $OT Visit: 1 Visit OT Treatments $Self Care/Home Management : 8-22 mins $Therapeutic Activity: 23-37 mins  Gerrianne Scale, MS, OTR/L ascom [phone] 01/01/21, 6:02 PM

## 2021-01-01 NOTE — Progress Notes (Signed)
Progress Note    Joanna Reed  QXI:[id number] DOB: 30-Jun-1941  DOA: 12/29/2020 PCP: Pleas Koch, NP    Brief Narrative:     Medical records reviewed and are as summarized below:  Joanna Reed is an 80 y.o. female with medical history significant of depression with anxiety, who presents with altered mental status and fall.  Found to have a UTI.    Daughter lives in [place]-- says her mother has not seen a doctor in 2 years.  Has always had some paranoia but this has worsened over the last 1 year.    Assessment/Plan:   Principal Problem:   Adjustment disorder with anxiety Active Problems:   Anxiety and depression   UTI (urinary tract infection)   Fall at home, initial encounter   Acute metabolic encephalopathy   Pressure injury of skin   Generalized anxiety disorder   Dysthymia   Cognitive impairment     UTI (urinary tract infection)-e coli- pan sensitive -change to PO abx to finish 5 total days  Anxiety and depression -Wellbutrin, Celexa  Fall at home, initial encounter: CT of the head and CT of C-spine negative. -PT/OT   -consult transition care team for  SNF placement  Acute metabolic encephalopathy: Likely due to UTI but not sure of patient's baseline - TSH normal, vitamin B12 low normal, vitamin B1 level pending -CT head shows atrophy -psych consult appreciated: no changes to her meds  Hyperglycemia -due to infection -much improved  Hypokalemia -repleted   Pressure Injury 12/29/20 Sacrum Medial Stage 2 -  Partial thickness loss of dermis presenting as a shallow open injury with a red, pink wound bed without slough. stage 2 pressure injury on sacrum with erythema surrounding (Active)  12/29/20 1640  Location: Sacrum  Location Orientation: Medial  Staging: Stage 2 -  Partial thickness loss of dermis presenting as a shallow open injury with a red, pink wound bed without slough.  Wound Description (Comments): stage 2 pressure injury  on sacrum with erythema surrounding  Present on Admission: Yes    Probable dementia -hallucinations reported by daughter -will need formal evaluation as an outpatient with neurology    Family Communication/Anticipated D/C date and plan/Code Status   DVT prophylaxis: Lovenox ordered. Code Status: Full Code.  Daughter 3/11 Disposition Plan: Status is: inpt  The patient will require care spanning > 2 midnights and should be moved to inpatient because: Inpatient level of care appropriate due to severity of illness  Dispo: The patient is from: Home              Anticipated d/c is to: SNF              Patient currently is medically stable to d/c.   Difficult to place patient No    Medical Consultants:    None.    Subjective:   Denies having a UTI  Objective:    Vitals:   12/31/20 1952 12/31/20 2355 01/01/21 0340 01/01/21 0343  BP: 132/70 138/77 139/75   Pulse: 73 86 83   Resp: 19 16 16    Temp: 97.6 F (36.4 C) 98.1 F (36.7 C) (!) 97.2 F (36.2 C) 97.7 F (36.5 C)  TempSrc:    Oral  SpO2: 93% 96% 98%   Weight:      Height:        Intake/Output Summary (Last 24 hours) at 01/01/2021 1037 Last data filed at 01/01/2021 0900 Gross per 24 hour  Intake 410 ml  Output 150  ml  Net 260 ml   Filed Weights   12/29/20 0634  Weight: 59 kg    Exam:   General: Appearance:    elderly female in no acute distress, sitting in chair     Lungs:     respirations unlabored  Heart:    Normal heart rate.   MS:   All extremities are intact.   Neurologic:   Awake, alert.     Data Reviewed:   I have personally reviewed following labs and imaging studies:  Labs: Labs show the following:   Basic Metabolic Panel: Recent Labs  Lab 12/29/20 0639 12/30/20 0532 12/31/20 0512  NA 141 142 140  K 3.5 3.3* 3.6  CL 108 109 111  CO2 24 23 23   GLUCOSE 89 80 87  BUN 17 10 7*  CREATININE 0.68 0.69 0.59  CALCIUM 9.0 8.2* 8.1*   GFR Estimated Creatinine Clearance: 44.4  mL/min (by C-G formula based on SCr of 0.59 mg/dL). Liver Function Tests: Recent Labs  Lab 12/29/20 0639  AST 28  ALT 20  ALKPHOS 38  BILITOT 1.0  PROT 6.4*  ALBUMIN 3.8   No results for input(s): LIPASE, AMYLASE in the last 168 hours. No results for input(s): AMMONIA in the last 168 hours. Coagulation profile No results for input(s): INR, PROTIME in the last 168 hours.  CBC: Recent Labs  Lab 12/29/20 0639 12/30/20 0532  WBC 11.0* 6.9  NEUTROABS 8.3*  --   HGB 12.1 11.0*  HCT 36.6 33.8*  MCV 88.4 89.9  PLT 268 261   Cardiac Enzymes: Recent Labs  Lab 12/29/20 0639 12/30/20 0532  CKTOTAL 509* 274*   BNP (last 3 results) No results for input(s): PROBNP in the last 8760 hours. CBG: Recent Labs  Lab 12/30/20 1634 12/30/20 1932 12/30/20 2352 12/31/20 0344 12/31/20 0721  GLUCAP 105* 72 85 89 85   D-Dimer: No results for input(s): DDIMER in the last 72 hours. Hgb A1c: Recent Labs    12/30/20 0532  HGBA1C 5.3   Lipid Profile: No results for input(s): CHOL, HDL, LDLCALC, TRIG, CHOLHDL, LDLDIRECT in the last 72 hours. Thyroid function studies: Recent Labs    12/29/20 1925  TSH 2.430   Anemia work up: Recent Labs    12/29/20 1925  VITAMINB12 261   Sepsis Labs: Recent Labs  Lab 12/29/20 0639 12/30/20 0532  WBC 11.0* 6.9    Microbiology Recent Results (from the past 240 hour(s))  Urine Culture     Status: Abnormal   Collection Time: 12/29/20  6:44 AM   Specimen: Urine, Random  Result Value Ref Range Status   Specimen Description   Final    URINE, RANDOM Performed at [facility], [address] [place], [place] [address]    Special Requests   Final    NONE Performed at [facility], [address] [place], [place] [address]    Culture >=100,000 COLONIES/mL ESCHERICHIA COLI (A)  Final   Report Status 12/31/2020 FINAL  Final   Organism ID, Bacteria ESCHERICHIA COLI (A)  Final      Susceptibility   Escherichia coli -  MIC*    AMPICILLIN 4 SENSITIVE Sensitive     CEFAZOLIN <=4 SENSITIVE Sensitive     CEFEPIME <=0.12 SENSITIVE Sensitive     CEFTRIAXONE <=0.25 SENSITIVE Sensitive     CIPROFLOXACIN <=0.25 SENSITIVE Sensitive     GENTAMICIN <=1 SENSITIVE Sensitive     IMIPENEM <=0.25 SENSITIVE Sensitive     NITROFURANTOIN <=16 SENSITIVE  Sensitive     TRIMETH/SULFA <=20 SENSITIVE Sensitive     AMPICILLIN/SULBACTAM <=2 SENSITIVE Sensitive     PIP/TAZO <=4 SENSITIVE Sensitive     * >=100,000 COLONIES/mL ESCHERICHIA COLI  SARS CORONAVIRUS 2 (TAT 6-24 HRS) Nasopharyngeal Nasopharyngeal Swab     Status: None   Collection Time: 12/29/20  7:32 AM   Specimen: Nasopharyngeal Swab  Result Value Ref Range Status   SARS Coronavirus 2 NEGATIVE NEGATIVE Final    Comment: (NOTE) SARS-CoV-2 target nucleic acids are NOT DETECTED.  The SARS-CoV-2 RNA is generally detectable in upper and lower respiratory specimens during the acute phase of infection. Negative results do not preclude SARS-CoV-2 infection, do not rule out co-infections with other pathogens, and should not be used as the sole basis for treatment or other patient management decisions. Negative results must be combined with clinical observations, patient history, and epidemiological information. The expected result is Negative.  Fact Sheet for Patients: SugarRoll.be  Fact Sheet for Healthcare Providers: https://www.woods-mathews.com/  This test is not yet approved or cleared by the [place] FDA and  has been authorized for detection and/or diagnosis of SARS-CoV-2 by FDA under an Emergency Use Authorization (EUA). This EUA will remain  in effect (meaning this test can be used) for the duration of the COVID-19 declaration under Se ction 564(b)(1) of the Act, 21 U.S.C. section 360bbb-3(b)(1), unless the authorization is terminated or revoked sooner.  Performed at [facility], [place] [address],  [place], [place] [address]   Culture, blood (Routine X 2) w Reflex to ID Panel     Status: None (Preliminary result)   Collection Time: 12/29/20 11:21 AM   Specimen: BLOOD  Result Value Ref Range Status   Specimen Description BLOOD LEFT ANTECUBITAL  Final   Special Requests   Final    BOTTLES DRAWN AEROBIC AND ANAEROBIC Blood Culture adequate volume   Culture   Final    NO GROWTH 3 DAYS Performed at [facility], [address] [place], [place] [address]    Report Status PENDING  Incomplete  Culture, blood (Routine X 2) w Reflex to ID Panel     Status: None (Preliminary result)   Collection Time: 12/29/20 11:21 AM   Specimen: BLOOD  Result Value Ref Range Status   Specimen Description BLOOD RIGHT FOREARM  Final   Special Requests   Final    BOTTLES DRAWN AEROBIC AND ANAEROBIC Blood Culture results may not be optimal due to an excessive volume of blood received in culture bottles   Culture   Final    NO GROWTH 3 DAYS Performed at [facility], [address], [place], [place] [address]    Report Status PENDING  Incomplete    Procedures and diagnostic studies:  No results found.  Medications:   . buPROPion  300 mg Oral Daily  . citalopram  20 mg Oral Daily  . enoxaparin (LOVENOX) injection  40 mg Subcutaneous Q24H  . vitamin B-12  500 mcg Oral Daily   Continuous Infusions: .  ceFAZolin (ANCEF) IV 1 g (01/01/21 0544)     LOS: 2 days   Geradine Girt  [facility]   How to contact the [facility] Attending or Consulting provider [place] or covering provider during after hours [place], for this patient?  1. Check the care team in [facility] and look for a) attending/consulting TRH provider listed and b) the [facility] team listed 2. Log into [email or web address] and use [place]'s universal password  to access. If you do not have the password, please contact the hospital operator. 3. Locate the [facility] provider you are looking for under [facility] and page to a number  that you can be directly reached. 4. If you still have difficulty reaching the provider, please page the [facility] (Director on Call) for the Hospitalists listed on amion for assistance.  01/01/2021, 10:37 AM

## 2021-01-02 LAB — GLUCOSE, CAPILLARY: Glucose-Capillary: 85 mg/dL (ref 70–99)

## 2021-01-02 NOTE — Plan of Care (Signed)
Shift Summary: Pt Aox1, answers to questions remain nonsensical and tangential. VSS. X1-2 assist to bedside commode. PRN hydroxyzine given for anxiety/sleep, effective. Fall/safety precautions in place, rounding performed, needs/concerns addressed during shift.   Problem: Education: Goal: Knowledge of General Education information will improve Description: Including pain rating scale, medication(s)/side effects and non-pharmacologic comfort measures Outcome: Progressing   Problem: Health Behavior/Discharge Planning: Goal: Ability to manage health-related needs will improve Outcome: Progressing   Problem: Clinical Measurements: Goal: Ability to maintain clinical measurements within normal limits will improve Outcome: Progressing Goal: Will remain free from infection Outcome: Progressing Goal: Diagnostic test results will improve Outcome: Progressing Goal: Respiratory complications will improve Outcome: Progressing Goal: Cardiovascular complication will be avoided Outcome: Progressing   Problem: Activity: Goal: Risk for activity intolerance will decrease Outcome: Progressing   Problem: Nutrition: Goal: Adequate nutrition will be maintained Outcome: Progressing   Problem: Coping: Goal: Level of anxiety will decrease Outcome: Progressing   Problem: Elimination: Goal: Will not experience complications related to bowel motility Outcome: Progressing Goal: Will not experience complications related to urinary retention Outcome: Progressing   Problem: Pain Managment: Goal: General experience of comfort will improve Outcome: Progressing   Problem: Safety: Goal: Ability to remain free from injury will improve Outcome: Progressing   Problem: Skin Integrity: Goal: Risk for impaired skin integrity will decrease Outcome: Progressing

## 2021-01-02 NOTE — Progress Notes (Signed)
Progress Note    Joanna Reed  VQX:[id number] DOB: 07-22-1941  DOA: 12/29/2020 PCP: Pleas Koch, NP    Brief Narrative:     Medical records reviewed and are as summarized below:  Joanna Reed is an 80 y.o. female with medical history significant of depression with anxiety, who presents with altered mental status and fall.  Found to have a UTI.    Daughter lives in [place]-- says her mother has not seen a doctor in 2 years.  Has always had some paranoia but this has worsened over the last 1 year.    Assessment/Plan:   Principal Problem:   Adjustment disorder with anxiety Active Problems:   Anxiety and depression   UTI (urinary tract infection)   Fall at home, initial encounter   Acute metabolic encephalopathy   Pressure injury of skin   Generalized anxiety disorder   Dysthymia   Cognitive impairment     UTI (urinary tract infection)-e coli- pan sensitive -change to PO abx to finish 5 total days  Anxiety and depression -Wellbutrin, Celexa  Fall at home, initial encounter: CT of the head and CT of C-spine negative. -PT/OT   -consult transition care team for  SNF placement  Acute metabolic encephalopathy: Likely due to UTI but not sure of patient's baseline - TSH normal, vitamin B12 low normal, vitamin B1 level pending -CT head shows atrophy -psych consult appreciated: no changes to her meds  Hyperglycemia -due to infection -much improved  Hypokalemia -repleted   Pressure Injury 12/29/20 Sacrum Medial Stage 2 -  Partial thickness loss of dermis presenting as a shallow open injury with a red, pink wound bed without slough. stage 2 pressure injury on sacrum with erythema surrounding (Active)  12/29/20 1640  Location: Sacrum  Location Orientation: Medial  Staging: Stage 2 -  Partial thickness loss of dermis presenting as a shallow open injury with a red, pink wound bed without slough.  Wound Description (Comments): stage 2 pressure injury  on sacrum with erythema surrounding  Present on Admission: Yes    Probable dementia -hallucinations reported by daughter +family history -will need formal evaluation as an outpatient with neurology    Family Communication/Anticipated D/C date and plan/Code Status   DVT prophylaxis: Lovenox ordered. Code Status: Full Code.  Daughter 3/11 Disposition Plan: Status is: inpt  The patient will require care spanning > 2 midnights and should be moved to inpatient because: Inpatient level of care appropriate due to severity of illness  Dispo: The patient is from: Home              Anticipated d/c is to: SNF              Patient currently is medically stable to d/c.   Difficult to place patient No    Medical Consultants:    None.    Subjective:   Says she feels like she out in the ocean  Objective:    Vitals:   01/01/21 1617 01/01/21 2040 01/02/21 0634 01/02/21 0819  BP: 136/89 133/73 (!) 148/74 (!) 144/82  Pulse: 85 74 85 87  Resp: 18 16 16 18   Temp: 97.7 F (36.5 C) (!) 97.3 F (36.3 C) 98.5 F (36.9 C) 97.8 F (36.6 C)  TempSrc: Oral Oral    SpO2: 97% 99% 98% 95%  Weight:      Height:        Intake/Output Summary (Last 24 hours) at 01/02/2021 0947 Last data filed at 01/01/2021 2040 Gross per  24 hour  Intake 118 ml  Output -  Net 118 ml   Filed Weights   12/29/20 0634  Weight: 59 kg    Exam:   General: Appearance:    elderly female in no acute distress     Lungs:      respirations unlabored  Heart:    Normal heart rate.   MS:   All extremities are intact.   Neurologic:   Awake, alert     Data Reviewed:   I have personally reviewed following labs and imaging studies:  Labs: Labs show the following:   Basic Metabolic Panel: Recent Labs  Lab 12/29/20 0639 12/30/20 0532 12/31/20 0512  NA 141 142 140  K 3.5 3.3* 3.6  CL 108 109 111  CO2 24 23 23   GLUCOSE 89 80 87  BUN 17 10 7*  CREATININE 0.68 0.69 0.59  CALCIUM 9.0 8.2* 8.1*    GFR Estimated Creatinine Clearance: 44.4 mL/min (by C-G formula based on SCr of 0.59 mg/dL). Liver Function Tests: Recent Labs  Lab 12/29/20 0639  AST 28  ALT 20  ALKPHOS 38  BILITOT 1.0  PROT 6.4*  ALBUMIN 3.8   No results for input(s): LIPASE, AMYLASE in the last 168 hours. No results for input(s): AMMONIA in the last 168 hours. Coagulation profile No results for input(s): INR, PROTIME in the last 168 hours.  CBC: Recent Labs  Lab 12/29/20 0639 12/30/20 0532  WBC 11.0* 6.9  NEUTROABS 8.3*  --   HGB 12.1 11.0*  HCT 36.6 33.8*  MCV 88.4 89.9  PLT 268 261   Cardiac Enzymes: Recent Labs  Lab 12/29/20 0639 12/30/20 0532  CKTOTAL 509* 274*   BNP (last 3 results) No results for input(s): PROBNP in the last 8760 hours. CBG: Recent Labs  Lab 12/30/20 2352 12/31/20 0344 12/31/20 0721 01/01/21 1620 01/02/21 0821  GLUCAP 85 89 85 105* 85   D-Dimer: No results for input(s): DDIMER in the last 72 hours. Hgb A1c: No results for input(s): HGBA1C in the last 72 hours. Lipid Profile: No results for input(s): CHOL, HDL, LDLCALC, TRIG, CHOLHDL, LDLDIRECT in the last 72 hours. Thyroid function studies: No results for input(s): TSH, T4TOTAL, T3FREE, THYROIDAB in the last 72 hours.  Invalid input(s): FREET3 Anemia work up: No results for input(s): VITAMINB12, FOLATE, FERRITIN, TIBC, IRON, RETICCTPCT in the last 72 hours. Sepsis Labs: Recent Labs  Lab 12/29/20 0639 12/30/20 0532  WBC 11.0* 6.9    Microbiology Recent Results (from the past 240 hour(s))  Urine Culture     Status: Abnormal   Collection Time: 12/29/20  6:44 AM   Specimen: Urine, Random  Result Value Ref Range Status   Specimen Description   Final    URINE, RANDOM Performed at [facility], [address] [place], [place] [address]    Special Requests   Final    NONE Performed at [facility], [place], [place] [address]    Culture >=100,000 COLONIES/mL  ESCHERICHIA COLI (A)  Final   Report Status 12/31/2020 FINAL  Final   Organism ID, Bacteria ESCHERICHIA COLI (A)  Final      Susceptibility   Escherichia coli - MIC*    AMPICILLIN 4 SENSITIVE Sensitive     CEFAZOLIN <=4 SENSITIVE Sensitive     CEFEPIME <=0.12 SENSITIVE Sensitive     CEFTRIAXONE <=0.25 SENSITIVE Sensitive     CIPROFLOXACIN <=0.25 SENSITIVE Sensitive     GENTAMICIN <=1 SENSITIVE Sensitive  IMIPENEM <=0.25 SENSITIVE Sensitive     NITROFURANTOIN <=16 SENSITIVE Sensitive     TRIMETH/SULFA <=20 SENSITIVE Sensitive     AMPICILLIN/SULBACTAM <=2 SENSITIVE Sensitive     PIP/TAZO <=4 SENSITIVE Sensitive     * >=100,000 COLONIES/mL ESCHERICHIA COLI  SARS CORONAVIRUS 2 (TAT 6-24 HRS) Nasopharyngeal Nasopharyngeal Swab     Status: None   Collection Time: 12/29/20  7:32 AM   Specimen: Nasopharyngeal Swab  Result Value Ref Range Status   SARS Coronavirus 2 NEGATIVE NEGATIVE Final    Comment: (NOTE) SARS-CoV-2 target nucleic acids are NOT DETECTED.  The SARS-CoV-2 RNA is generally detectable in upper and lower respiratory specimens during the acute phase of infection. Negative results do not preclude SARS-CoV-2 infection, do not rule out co-infections with other pathogens, and should not be used as the sole basis for treatment or other patient management decisions. Negative results must be combined with clinical observations, patient history, and epidemiological information. The expected result is Negative.  Fact Sheet for Patients: SugarRoll.be  Fact Sheet for Healthcare Providers: https://www.woods-mathews.com/  This test is not yet approved or cleared by the [place] FDA and  has been authorized for detection and/or diagnosis of SARS-CoV-2 by FDA under an Emergency Use Authorization (EUA). This EUA will remain  in effect (meaning this test can be used) for the duration of the COVID-19 declaration under Se ction 564(b)(1)  of the Act, 21 U.S.C. section 360bbb-3(b)(1), unless the authorization is terminated or revoked sooner.  Performed at [facility], [place] [address], [place], [place] [address]   Culture, blood (Routine X 2) w Reflex to ID Panel     Status: None (Preliminary result)   Collection Time: 12/29/20 11:21 AM   Specimen: BLOOD  Result Value Ref Range Status   Specimen Description BLOOD LEFT ANTECUBITAL  Final   Special Requests   Final    BOTTLES DRAWN AEROBIC AND ANAEROBIC Blood Culture adequate volume   Culture   Final    NO GROWTH 4 DAYS Performed at [facility], [address], [place], [place] [address]    Report Status PENDING  Incomplete  Culture, blood (Routine X 2) w Reflex to ID Panel     Status: None (Preliminary result)   Collection Time: 12/29/20 11:21 AM   Specimen: BLOOD  Result Value Ref Range Status   Specimen Description BLOOD RIGHT FOREARM  Final   Special Requests   Final    BOTTLES DRAWN AEROBIC AND ANAEROBIC Blood Culture results may not be optimal due to an excessive volume of blood received in culture bottles   Culture   Final    NO GROWTH 4 DAYS Performed at [facility], [address] [place], [place] [address]    Report Status PENDING  Incomplete    Procedures and diagnostic studies:  No results found.  Medications:   . buPROPion  300 mg Oral Daily  . cephALEXin  500 mg Oral Q12H  . citalopram  20 mg Oral Daily  . enoxaparin (LOVENOX) injection  40 mg Subcutaneous Q24H  . vitamin B-12  500 mcg Oral Daily   Continuous Infusions:    LOS: 3 days   Geradine Girt  [facility]   How to contact the [facility] Attending or Consulting provider [place] or covering provider during after hours [place], for this patient?  1. Check the care team in [facility] and look for a) attending/consulting TRH provider listed and b) the [facility] team listed 2.  Log into [email or web address] and use [place]'s universal password to access. If you do not  have the password, please contact the hospital operator. 3. Locate the [facility] provider you are looking for under [facility] and page to a number that you can be directly reached. 4. If you still have difficulty reaching the provider, please page the [facility] (Director on Call) for the Hospitalists listed on amion for assistance.  01/02/2021, 9:47 AM

## 2021-01-02 NOTE — TOC Progression Note (Signed)
Transition of Care [facility]) - Progression Note    Patient Details  Name: Joanna Reed MRN: [id number] Date of Birth: [birth year]  Transition of Care [facility] CM/SW Contact  Truitt Merle, [place] Phone Number: 01/02/2021, 4:53 PM  Clinical Narrative:    [facility] provided family friend Hoyle Sauer [phone]) with bed offers ([place], [place]). Hoyle Sauer will speak with patient's daughter in [place] to get decision and call back TOC. PASRR number is [phone] A. TOC continuing to follow for discharge needs.    Expected Discharge Plan: [place] Barriers to Discharge: Continued Medical Work up  Expected Discharge Plan and Services Expected Discharge Plan: [place] arrangements for the past 2 months: Single Family Home                                       Social Determinants of Health (SDOH) Interventions    Readmission Risk Interventions No flowsheet data found.

## 2021-01-02 NOTE — Progress Notes (Signed)
Came to floor as patient's daughter was to call from [place] for update.  Waited for call but none received. JV

## 2021-01-02 NOTE — Plan of Care (Signed)
No acute changes this shift. POC progressing

## 2021-01-03 LAB — CULTURE, BLOOD (ROUTINE X 2)
Culture: NO GROWTH
Culture: NO GROWTH
Special Requests: ADEQUATE

## 2021-01-03 LAB — RESP PANEL BY RT-PCR (FLU A&B, COVID) ARPGX2
Influenza A by PCR: NEGATIVE
Influenza B by PCR: NEGATIVE
SARS Coronavirus 2 by RT PCR: NEGATIVE

## 2021-01-03 MED ORDER — HALOPERIDOL 2 MG PO TABS: 2.0000 mg | ORAL_TABLET | Freq: Four times a day (QID) | ORAL | Status: DC | PRN

## 2021-01-03 MED ORDER — CYANOCOBALAMIN 500 MCG PO TABS: 500.0000 ug | ORAL_TABLET | Freq: Every day | ORAL | Status: DC

## 2021-01-03 NOTE — TOC Progression Note (Signed)
Transition of Care [facility]) - Progression Note    Patient Details  Name: Joanna Reed MRN: [id number] Date of Birth: November 30, 1940  [facility] CM/SW Contact  Shelbie Hutching, RN Phone Number: 01/03/2021, 2:22 PM  Clinical Narrative:    [facility] and [facility] are unable to accept patient for rehab.  Hoyle Sauer agrees to [facility].  RNCM is starting insurance auth through the [place] portal.  PT does need to re-evaluate, last PT note from 3/10.     Expected Discharge Plan: [place] Barriers to Discharge: Continued Medical Work up  Expected Discharge Plan and Services Expected Discharge Plan: [place] arrangements for the past 2 months: Single Family Home Expected Discharge Date: 01/03/21                                     Social Determinants of Health (SDOH) Interventions    Readmission Risk Interventions No flowsheet data found.

## 2021-01-03 NOTE — NC FL2 (Signed)
[place] LEVEL OF CARE SCREENING TOOL     IDENTIFICATION  Patient Name: Joanna Reed Birthdate: [birth year] Sex: female Admission Date (Current Location): 12/29/2020  [place] and [place] Number:  Engineering geologist and Address:  [facility], [address], [place],  [address]      Provider Number: [id number]  Attending Physician Name and Address:  Geradine Girt, DO  Relative Name and Phone Number:  [name] [phone]    Current Level of Care: Hospital Recommended Level of Care: [place] Prior Approval Number:    Date Approved/Denied:   PASRR Number: [id number]  Discharge Plan: SNF    Current Diagnoses: Patient Active Problem List   Diagnosis Date Noted  . Generalized anxiety disorder 12/31/2020  . Dysthymia 12/31/2020  . Cognitive impairment 12/31/2020  . 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  . UTI (urinary tract infection) 12/29/2020  . Fall at home, initial encounter 12/29/2020  . Acute metabolic encephalopathy 76/73/4193  . Pressure injury of skin 12/29/2020  . Preventative health care 03/19/2018  . Chronic knee pain 02/27/2017  . Medicare annual wellness visit, subsequent 01/28/2015  . Anxiety and depression 01/19/2015  . Acute shoulder pain 01/19/2015    Orientation RESPIRATION BLADDER Height & Weight     Self  Normal Incontinent Weight: 59 kg Height:  5\' 2"  (157.5 cm)  BEHAVIORAL SYMPTOMS/MOOD NEUROLOGICAL BOWEL NUTRITION STATUS      Incontinent Diet (regular diet, thin liquids)  AMBULATORY STATUS COMMUNICATION OF NEEDS Skin   Limited Assist Verbally  (stage 2 sacrum)                       Personal Care Assistance Level of Assistance  Bathing,Feeding,Dressing Bathing Assistance: Limited assistance Feeding assistance: Limited assistance Dressing Assistance: Limited assistance     Functional Limitations Info             SPECIAL CARE FACTORS FREQUENCY   PT (By licensed PT),OT (By licensed OT)     PT Frequency: 5 x/week OT Frequency: 5 x/week            Contractures Contractures Info: Not present    Additional Factors Info  Code Status,Allergies Code Status Info: full code Allergies Info: sulfa antibiotics           Current Medications (01/03/2021):  This is the current hospital active medication list Current Facility-Administered Medications  Medication Dose Route Frequency Provider Last Rate Last Admin  . acetaminophen (TYLENOL) tablet 650 mg  650 mg Oral Q6H PRN Ivor Costa, MD      . buPROPion (WELLBUTRIN XL) 24 hr tablet 300 mg  300 mg Oral Daily Ivor Costa, MD   300 mg at 01/03/21 0842  . cephALEXin (KEFLEX) capsule 500 mg  500 mg Oral Q12H Vann, Jessica U, DO   500 mg at 01/03/21 0843  . citalopram (CELEXA) tablet 20 mg  20 mg Oral Daily Ivor Costa, MD   20 mg at 01/03/21 0842  . enoxaparin (LOVENOX) injection 40 mg  40 mg Subcutaneous Q24H Ivor Costa, MD   40 mg at 01/03/21 7902  . haloperidol (HALDOL) tablet 2 mg  2 mg Oral Q6H PRN Eulogio Bear U, DO   2 mg at 01/02/21 2055  . hydrOXYzine (ATARAX/VISTARIL) tablet 25 mg  25 mg Oral QHS PRN Ivor Costa, MD   25 mg at 01/02/21 1930  . ondansetron (ZOFRAN) tablet 4 mg  4 mg Oral Q6H PRN Ivor Costa, MD      .  vitamin B-12 (CYANOCOBALAMIN) tablet 500 mcg  500 mcg Oral Daily Eulogio Bear U, DO   500 mcg at 01/03/21 2956     Discharge Medications: Please see discharge summary for a list of discharge medications.  Relevant Imaging Results:  Relevant Lab Results:   Additional Information SS #: [id number]  Shelbie Hutching, RN

## 2021-01-03 NOTE — Discharge Summary (Addendum)
Physician Discharge Summary  [name] EHU:[id number] DOB: 05-Dec-1940 DOA: 12/29/2020  PCP: Pleas Koch, NP  Admit date: 12/29/2020 Discharge date: 01/04/2021  Admitted From: home Discharge disposition: snf   Recommendations for Outpatient Follow-Up:   1. Delirium precautions 2. Prn Qtc monitoring 3. Formal outpatient evaluation by neurology 4. Vitamin B1 pending-- replacing B12 orally 5. Would use between meal supplementation with protein shakes if not eating well   Discharge Diagnosis:   Principal Problem:   Adjustment disorder with anxiety Active Problems:   Anxiety and depression   UTI (urinary tract infection)   Fall at home, initial encounter   Acute metabolic encephalopathy   Pressure injury of skin   Generalized anxiety disorder   Dysthymia   Cognitive impairment    Discharge Condition: Improved.  Diet recommendation: Low sodium, heart healthy.   Wound care: None.  Code status: Full.   History of Present Illness:   Joanna Reed is a 80 y.o. female with medical history significant of depression with anxiety, who presents with altered mental status and fall.  Patient has AMS, and is unable to provide accurate medical history. I have tried to call her friend without success,  therefore, most of the history is obtained by discussing the case with ED physician, per EMS report, and with the nursing staff.  Per report, pt was found on ground by neighbor. Pt does not remember falling.  Not complaining of any pain.  Patient is confused. Per RN report, pt had friend who visited pt in hospital and told RN that pt has been hallucinating and acting differently in the past several months.  Patient's daughter who lives in [place] has arranged for patient to get evaluated by home health.  She lives alone. Patient does not have power of attorney or any close family around.    Hospital Course by Problem:   UTI (urinary tract infection)-e coli-  pan sensitive -s/p abx  Anxiety and depression -Wellbutrin, Celexa  Fall at home, initial encounter:CT of the head and CT of C-spine negative. -PT/OT  -SNF placement  Acute metabolic encephalopathy:Likely due to UTI but not sure of patient's baseline - TSH normal, vitamin B12 low normal, vitamin B1 level pending -CT head shows atrophy -psych consult appreciated: no changes to her meds  Hyperglycemia -due to infection -resolved  Hypokalemia -repleted   Pressure Injury 12/29/20 Sacrum Medial Stage 2 -  Partial thickness loss of dermis presenting as a shallow open injury with a red, pink wound bed without slough. stage 2 pressure injury on sacrum with erythema surrounding (Active)  12/29/20 1640  Location: Sacrum  Location Orientation: Medial  Staging: Stage 2 -  Partial thickness loss of dermis presenting as a shallow open injury with a red, pink wound bed without slough.  Wound Description (Comments): stage 2 pressure injury on sacrum with erythema surrounding  Present on Admission: Yes    Probable dementia -hallucinations increasing as reported by daughter +family history -will need formal evaluation as an outpatient with neurology     Medical Consultants:   psych   Discharge Exam:   Vitals:   01/03/21 2044 01/04/21 0359  BP: 131/77 131/76  Pulse: 79 72  Resp: 16 18  Temp: 98 F (36.7 C) 98.4 F (36.9 C)  SpO2: 97% 99%   Vitals:   01/03/21 1124 01/03/21 1735 01/03/21 2044 01/04/21 0359  BP: 124/73 106/88 131/77 131/76  Pulse: 78 93 79 72  Resp: 14 15 16 18   Temp: 98.6  F (37 C) 98.4 F (36.9 C) 98 F (36.7 C) 98.4 F (36.9 C)  TempSrc: Oral Oral    SpO2: 99% 98% 97% 99%  Weight:      Height:        General exam: Appears calm and comfortable.    The results of significant diagnostics from this hospitalization (including imaging, microbiology, ancillary and laboratory) are listed below for reference.     Procedures and  Diagnostic Studies:   CT Head Wo Contrast  Result Date: 12/29/2020 CLINICAL DATA:  Confusion.  Unwitnessed fall EXAM: CT HEAD WITHOUT CONTRAST CT CERVICAL SPINE WITHOUT CONTRAST TECHNIQUE: Multidetector CT imaging of the head and cervical spine was performed following the standard protocol without intravenous contrast. Multiplanar CT image reconstructions of the cervical spine were also generated. COMPARISON:  None. FINDINGS: CT HEAD FINDINGS Brain: There is moderate diffuse atrophy. There is no intracranial mass, hemorrhage, extra-axial fluid collection, or midline shift. There is mild patchy decreased attenuation in the centra semiovale bilaterally. No acute infarct is demonstrable on this study. Vascular: No hyperdense vessels. No appreciable vascular calcification. Skull: Bony calvarium appears intact. Sinuses/Orbits: Paranasal sinuses are clear. There is rightward deviation of the nasal septum. Orbits appear symmetric bilaterally. Other: Mastoid air cells are clear. CT CERVICAL SPINE FINDINGS Alignment: There is 2 mm of anterolisthesis of C4 on C5. There is 1 mm of anterolisthesis of C7 on T1. Skull base and vertebrae: Skull base and craniocervical junction regions appear normal. Mild pannus is noted posterior to the odontoid without appreciable impression on the craniocervical junction. No acute fracture evident. No blastic or lytic bone lesions. Soft tissues and spinal canal: Prevertebral soft tissues and predental space regions are normal. No cord or canal hematoma. No evident paraspinous lesion. Disc levels: There is severe disc space narrowing at C3-4, C5-6, C6-7. There is moderate disc space narrowing at other levels. There is multilevel facet hypertrophy. There is exit foraminal narrowing on the right due to bony hypertrophy at C5-6 with impression on the exiting nerve root. Similar changes are noted to a slightly lesser degree on the left at C6-7. There is no disc extrusion or high-grade stenosis.  Upper chest: Visualized upper lung regions are clear. Other: There is mild calcification in each carotid artery. IMPRESSION: CT head: Atrophy with periventricular small vessel disease. No evident acute infarct. No mass or hemorrhage. CT cervical spine: No fracture. Areas of slight spondylolisthesis likely due to underlying spondylosis. There is multilevel arthropathy without frank disc extrusion or stenosis. Foci of carotid artery calcification bilaterally noted. Electronically Signed   By: Lowella Grip III M.D.   On: 12/29/2020 08:09   CT Cervical Spine Wo Contrast  Result Date: 12/29/2020 CLINICAL DATA:  Confusion.  Unwitnessed fall EXAM: CT HEAD WITHOUT CONTRAST CT CERVICAL SPINE WITHOUT CONTRAST TECHNIQUE: Multidetector CT imaging of the head and cervical spine was performed following the standard protocol without intravenous contrast. Multiplanar CT image reconstructions of the cervical spine were also generated. COMPARISON:  None. FINDINGS: CT HEAD FINDINGS Brain: There is moderate diffuse atrophy. There is no intracranial mass, hemorrhage, extra-axial fluid collection, or midline shift. There is mild patchy decreased attenuation in the centra semiovale bilaterally. No acute infarct is demonstrable on this study. Vascular: No hyperdense vessels. No appreciable vascular calcification. Skull: Bony calvarium appears intact. Sinuses/Orbits: Paranasal sinuses are clear. There is rightward deviation of the nasal septum. Orbits appear symmetric bilaterally. Other: Mastoid air cells are clear. CT CERVICAL SPINE FINDINGS Alignment: There is 2 mm of anterolisthesis  of C4 on C5. There is 1 mm of anterolisthesis of C7 on T1. Skull base and vertebrae: Skull base and craniocervical junction regions appear normal. Mild pannus is noted posterior to the odontoid without appreciable impression on the craniocervical junction. No acute fracture evident. No blastic or lytic bone lesions. Soft tissues and spinal canal:  Prevertebral soft tissues and predental space regions are normal. No cord or canal hematoma. No evident paraspinous lesion. Disc levels: There is severe disc space narrowing at C3-4, C5-6, C6-7. There is moderate disc space narrowing at other levels. There is multilevel facet hypertrophy. There is exit foraminal narrowing on the right due to bony hypertrophy at C5-6 with impression on the exiting nerve root. Similar changes are noted to a slightly lesser degree on the left at C6-7. There is no disc extrusion or high-grade stenosis. Upper chest: Visualized upper lung regions are clear. Other: There is mild calcification in each carotid artery. IMPRESSION: CT head: Atrophy with periventricular small vessel disease. No evident acute infarct. No mass or hemorrhage. CT cervical spine: No fracture. Areas of slight spondylolisthesis likely due to underlying spondylosis. There is multilevel arthropathy without frank disc extrusion or stenosis. Foci of carotid artery calcification bilaterally noted. Electronically Signed   By: Lowella Grip III M.D.   On: 12/29/2020 08:09   DG Chest Portable 1 View  Result Date: 12/29/2020 CLINICAL DATA:  Weakness. EXAM: PORTABLE CHEST 1 VIEW COMPARISON:  No prior. FINDINGS: Heart size normal. Pulmonary vascularity normal. Low lung volumes. Mild bibasilar subsegmental atelectasis. No pleural effusion or pneumothorax. Degenerative changes scoliosis thoracic spine. IMPRESSION: Low lung volumes with mild bibasilar subsegmental atelectasis. Electronically Signed   By: Marcello Moores  Register   On: 12/29/2020 07:25     Labs:   Basic Metabolic Panel: Recent Labs  Lab 12/29/20 0639 12/30/20 0532 12/31/20 0512  NA 141 142 140  K 3.5 3.3* 3.6  CL 108 109 111  CO2 24 23 23   GLUCOSE 89 80 87  BUN 17 10 7*  CREATININE 0.68 0.69 0.59  CALCIUM 9.0 8.2* 8.1*   GFR Estimated Creatinine Clearance: 44.4 mL/min (by C-G formula based on SCr of 0.59 mg/dL). Liver Function Tests: Recent  Labs  Lab 12/29/20 0639  AST 28  ALT 20  ALKPHOS 38  BILITOT 1.0  PROT 6.4*  ALBUMIN 3.8   No results for input(s): LIPASE, AMYLASE in the last 168 hours. No results for input(s): AMMONIA in the last 168 hours. Coagulation profile No results for input(s): INR, PROTIME in the last 168 hours.  CBC: Recent Labs  Lab 12/29/20 0639 12/30/20 0532  WBC 11.0* 6.9  NEUTROABS 8.3*  --   HGB 12.1 11.0*  HCT 36.6 33.8*  MCV 88.4 89.9  PLT 268 261   Cardiac Enzymes: Recent Labs  Lab 12/29/20 0639 12/30/20 0532  CKTOTAL 509* 274*   BNP: Invalid input(s): POCBNP CBG: Recent Labs  Lab 12/30/20 2352 12/31/20 0344 12/31/20 0721 01/01/21 1620 01/02/21 0821  GLUCAP 85 89 85 105* 85   D-Dimer No results for input(s): DDIMER in the last 72 hours. Hgb A1c No results for input(s): HGBA1C in the last 72 hours. Lipid Profile No results for input(s): CHOL, HDL, LDLCALC, TRIG, CHOLHDL, LDLDIRECT in the last 72 hours. Thyroid function studies No results for input(s): TSH, T4TOTAL, T3FREE, THYROIDAB in the last 72 hours.  Invalid input(s): FREET3 Anemia work up No results for input(s): VITAMINB12, FOLATE, FERRITIN, TIBC, IRON, RETICCTPCT in the last 72 hours. Microbiology Recent Results (from the  past 240 hour(s))  Urine Culture     Status: Abnormal   Collection Time: 12/29/20  6:44 AM   Specimen: Urine, Random  Result Value Ref Range Status   Specimen Description   Final    URINE, RANDOM Performed at [facility], [address] [place], [place] [address]    Special Requests   Final    NONE Performed at [facility], [place], [place] [address]    Culture >=100,000 COLONIES/mL ESCHERICHIA COLI (A)  Final   Report Status 12/31/2020 FINAL  Final   Organism ID, Bacteria ESCHERICHIA COLI (A)  Final      Susceptibility   Escherichia coli - MIC*    AMPICILLIN 4 SENSITIVE Sensitive     CEFAZOLIN <=4 SENSITIVE Sensitive     CEFEPIME <=0.12  SENSITIVE Sensitive     CEFTRIAXONE <=0.25 SENSITIVE Sensitive     CIPROFLOXACIN <=0.25 SENSITIVE Sensitive     GENTAMICIN <=1 SENSITIVE Sensitive     IMIPENEM <=0.25 SENSITIVE Sensitive     NITROFURANTOIN <=16 SENSITIVE Sensitive     TRIMETH/SULFA <=20 SENSITIVE Sensitive     AMPICILLIN/SULBACTAM <=2 SENSITIVE Sensitive     PIP/TAZO <=4 SENSITIVE Sensitive     * >=100,000 COLONIES/mL ESCHERICHIA COLI  SARS CORONAVIRUS 2 (TAT 6-24 HRS) Nasopharyngeal Nasopharyngeal Swab     Status: None   Collection Time: 12/29/20  7:32 AM   Specimen: Nasopharyngeal Swab  Result Value Ref Range Status   SARS Coronavirus 2 NEGATIVE NEGATIVE Final    Comment: (NOTE) SARS-CoV-2 target nucleic acids are NOT DETECTED.  The SARS-CoV-2 RNA is generally detectable in upper and lower respiratory specimens during the acute phase of infection. Negative results do not preclude SARS-CoV-2 infection, do not rule out co-infections with other pathogens, and should not be used as the sole basis for treatment or other patient management decisions. Negative results must be combined with clinical observations, patient history, and epidemiological information. The expected result is Negative.  Fact Sheet for Patients: SugarRoll.be  Fact Sheet for Healthcare Providers: https://www.woods-mathews.com/  This test is not yet approved or cleared by the [place] FDA and  has been authorized for detection and/or diagnosis of SARS-CoV-2 by FDA under an Emergency Use Authorization (EUA). This EUA will remain  in effect (meaning this test can be used) for the duration of the COVID-19 declaration under Se ction 564(b)(1) of the Act, 21 U.S.C. section 360bbb-3(b)(1), unless the authorization is terminated or revoked sooner.  Performed at [facility], [place] [address], [place], [place] [address]   Culture, blood (Routine X 2) w Reflex to ID Panel     Status: None    Collection Time: 12/29/20 11:21 AM   Specimen: BLOOD  Result Value Ref Range Status   Specimen Description BLOOD LEFT ANTECUBITAL  Final   Special Requests   Final    BOTTLES DRAWN AEROBIC AND ANAEROBIC Blood Culture adequate volume   Culture   Final    NO GROWTH 5 DAYS Performed at [facility], [address], [place],  [address]    Report Status 01/03/2021 FINAL  Final  Culture, blood (Routine X 2) w Reflex to ID Panel     Status: None   Collection Time: 12/29/20 11:21 AM   Specimen: BLOOD  Result Value Ref Range Status   Specimen Description BLOOD RIGHT FOREARM  Final   Special Requests   Final    BOTTLES DRAWN AEROBIC AND ANAEROBIC Blood Culture results may not  be optimal due to an excessive volume of blood received in culture bottles   Culture   Final    NO GROWTH 5 DAYS Performed at [facility], [address] [place], [place] [address]    Report Status 01/03/2021 FINAL  Final  Resp Panel by RT-PCR (Flu A&B, Covid) Nasopharyngeal Swab     Status: None   Collection Time: 01/03/21  4:20 PM   Specimen: Nasopharyngeal Swab; Nasopharyngeal(NP) swabs in vial transport medium  Result Value Ref Range Status   SARS Coronavirus 2 by RT PCR NEGATIVE NEGATIVE Final    Comment: (NOTE) SARS-CoV-2 target nucleic acids are NOT DETECTED.  The SARS-CoV-2 RNA is generally detectable in upper respiratory specimens during the acute phase of infection. The lowest concentration of SARS-CoV-2 viral copies this assay can detect is 138 copies/mL. A negative result does not preclude SARS-Cov-2 infection and should not be used as the sole basis for treatment or other patient management decisions. A negative result may occur with  improper specimen collection/handling, submission of specimen other than nasopharyngeal swab, presence of viral mutation(s) within the areas targeted by this assay, and inadequate number of viral copies(<138 copies/mL). A negative result must  be combined with clinical observations, patient history, and epidemiological information. The expected result is Negative.  Fact Sheet for Patients:  EntrepreneurPulse.com.au  Fact Sheet for Healthcare Providers:  IncredibleEmployment.be  This test is no t yet approved or cleared by the [place] FDA and  has been authorized for detection and/or diagnosis of SARS-CoV-2 by FDA under an Emergency Use Authorization (EUA). This EUA will remain  in effect (meaning this test can be used) for the duration of the COVID-19 declaration under Section 564(b)(1) of the Act, 21 U.S.C.section 360bbb-3(b)(1), unless the authorization is terminated  or revoked sooner.       Influenza A by PCR NEGATIVE NEGATIVE Final   Influenza B by PCR NEGATIVE NEGATIVE Final    Comment: (NOTE) The Xpert Xpress SARS-CoV-2/FLU/RSV plus assay is intended as an aid in the diagnosis of influenza from Nasopharyngeal swab specimens and should not be used as a sole basis for treatment. Nasal washings and aspirates are unacceptable for Xpert Xpress SARS-CoV-2/FLU/RSV testing.  Fact Sheet for Patients: EntrepreneurPulse.com.au  Fact Sheet for Healthcare Providers: IncredibleEmployment.be  This test is not yet approved or cleared by the [place] FDA and has been authorized for detection and/or diagnosis of SARS-CoV-2 by FDA under an Emergency Use Authorization (EUA). This EUA will remain in effect (meaning this test can be used) for the duration of the COVID-19 declaration under Section 564(b)(1) of the Act, 21 U.S.C. section 360bbb-3(b)(1), unless the authorization is terminated or revoked.  Performed at [facility], [address], [place], [place] [address]      Discharge Instructions:   Discharge Instructions    Diet general   Complete by: As directed    Increase activity slowly   Complete by: As directed    No  wound care   Complete by: As directed      Allergies as of 01/04/2021      Reactions   Sulfa Antibiotics       Medication List    TAKE these medications   buPROPion 300 MG 24 hr tablet Commonly known as: WELLBUTRIN XL TAKE 1 TABLET BY MOUTH EVERY DAY   citalopram 20 MG tablet Commonly known as: CELEXA TAKE 1 TABLET BY MOUTH EVERY DAY   haloperidol 2 MG tablet Commonly known as: HALDOL Take 1  tablet (2 mg total) by mouth every 6 (six) hours as needed for agitation.   hydrOXYzine 25 MG tablet Commonly known as: ATARAX/VISTARIL TAKE 1 TABLET (25 MG TOTAL) BY MOUTH AT BEDTIME AS NEEDED FOR ANXIETY (SLEEP).   vitamin B-12 500 MCG tablet Commonly known as: CYANOCOBALAMIN Take 1 tablet (500 mcg total) by mouth daily.       Contact information for after-discharge care    Destination    [facility] [place] Preferred SNF .   Service: Skilled Nursing Contact information: [address] [place] [phone]                   Time coordinating discharge: 35 min  Signed:  Geradine Girt DO  [facility] 01/04/2021, 7:34 AM

## 2021-01-03 NOTE — Progress Notes (Signed)
Physical Therapy Treatment Patient Details Name: Joanna Reed MRN: [id number] DOB: 11-16-1940 Today's Date: 01/03/2021    History of Present Illness Pt is an 80 y/o F with PMH: depression and anxiety who was found down by a neighbor. Presented to ED via EMS with confusion. Currently being treated for acute metabolic encephalopathy related to UTI.    PT Comments    OOB with min a x 1.  She is able to walk to bathroom then around unit with RW and min a x 1 for balance and support.  She has trouble with thresholds and any changes in floor patterns causing her to "freeze" and needs physical and verbal cues to continue gait with significantly exaggerated step height over trace to no changes in height.  Balance remains decreased with trouble with stand to sit transitions.  Voices some fear.     Follow Up Recommendations  SNF     Equipment Recommendations  Rolling walker with 5" wheels    Recommendations for Other Services       Precautions / Restrictions Precautions Precautions: Fall Restrictions Weight Bearing Restrictions: No    Mobility  Bed Mobility Overal bed mobility: Needs Assistance Bed Mobility: Supine to Sit     Supine to sit: Min assist          Transfers Overall transfer level: Needs assistance Equipment used: Rolling walker (2 wheeled) Transfers: Sit to/from Stand Sit to Stand: Min assist            Ambulation/Gait Ambulation/Gait assistance: Min guard;Min assist Gait Distance (Feet): 200 Feet Assistive device: Rolling walker (2 wheeled) Gait Pattern/deviations: Step-through pattern;Decreased step length - right;Decreased step length - left;Trunk flexed Gait velocity: decreased       Stairs             Wheelchair Mobility    Modified Rankin (Stroke Patients Only)       Balance Overall balance assessment: Needs assistance Sitting-balance support: No upper extremity supported;Feet supported Sitting balance-Leahy Scale: Good      Standing balance support: Bilateral upper extremity supported Standing balance-Leahy Scale: Fair                              Cognition Arousal/Alertness: Awake/alert Behavior During Therapy: WFL for tasks assessed/performed;Agitated Overall Cognitive Status: No family/caregiver present to determine baseline cognitive functioning                                        Exercises Other Exercises Other Exercises: to commode to void    General Comments        Pertinent Vitals/Pain Pain Assessment: No/denies pain    Home Living                      Prior Function            PT Goals (current goals can now be found in the care plan section) Progress towards PT goals: Progressing toward goals    Frequency    Min 2X/week      PT Plan Current plan remains appropriate    Co-evaluation              AM-PAC PT "6 Clicks" Mobility   Outcome Measure  Help needed turning from your back to your side while in a flat bed without using bedrails?: None Help needed  moving from lying on your back to sitting on the side of a flat bed without using bedrails?: A Little Help needed moving to and from a bed to a chair (including a wheelchair)?: A Little Help needed standing up from a chair using your arms (e.g., wheelchair or bedside chair)?: A Little Help needed to walk in hospital room?: A Little Help needed climbing 3-5 steps with a railing? : A Little 6 Click Score: 19    End of Session Equipment Utilized During Treatment: Gait belt Activity Tolerance: Patient tolerated treatment well Patient left: in chair;with call bell/phone within reach;with chair alarm set Nurse Communication: Mobility status       Time: 3875-6433 PT Time Calculation (min) (ACUTE ONLY): 24 min  Charges:  $Gait Training: 8-22 mins $Therapeutic Activity: 8-22 mins                    Chesley Noon, PTA 01/03/21, 4:21 PM

## 2021-01-03 NOTE — Plan of Care (Signed)
Shift Summary: Pt remains oriented to self only, conversation continues to be tangential. No c/o pain. Adequate UO, small BM this shift. Turns self in bed. Fall/safety precautions in place, rounding performed, needs/concerns addressed during shift.   Problem: Education: Goal: Knowledge of General Education information will improve Description: Including pain rating scale, medication(s)/side effects and non-pharmacologic comfort measures Outcome: Progressing   Problem: Health Behavior/Discharge Planning: Goal: Ability to manage health-related needs will improve Outcome: Progressing   Problem: Clinical Measurements: Goal: Ability to maintain clinical measurements within normal limits will improve Outcome: Progressing Goal: Will remain free from infection Outcome: Progressing Goal: Diagnostic test results will improve Outcome: Progressing Goal: Respiratory complications will improve Outcome: Progressing Goal: Cardiovascular complication will be avoided Outcome: Progressing   Problem: Activity: Goal: Risk for activity intolerance will decrease Outcome: Progressing   Problem: Nutrition: Goal: Adequate nutrition will be maintained Outcome: Progressing   Problem: Coping: Goal: Level of anxiety will decrease Outcome: Progressing   Problem: Elimination: Goal: Will not experience complications related to bowel motility Outcome: Progressing Goal: Will not experience complications related to urinary retention Outcome: Progressing   Problem: Pain Managment: Goal: General experience of comfort will improve Outcome: Progressing   Problem: Safety: Goal: Ability to remain free from injury will improve Outcome: Progressing   Problem: Skin Integrity: Goal: Risk for impaired skin integrity will decrease Outcome: Progressing

## 2021-01-03 NOTE — TOC Progression Note (Signed)
Transition of Care [facility]) - Progression Note    Patient Details  Name: Joanna Reed MRN: [id number] Date of Birth: [birth year]  [facility]) CM/SW Contact  Shelbie Hutching, RN Phone Number: 01/03/2021, 12:29 PM  Clinical Narrative:     RNCM spoke with patient's good friend and contact person Hoyle Sauer this morning.  Hoyle Sauer would like for [facility] to check with [facility] and [facility].  RNCM has reached out to [facility] and since patient has not been vaccinated they cannot offer at bed at this time, they have no private rooms.  [facility] will look at referral and get back with this RNCM.   Expected Discharge Plan: [place] Barriers to Discharge: Continued Medical Work up  Expected Discharge Plan and Services Expected Discharge Plan: [place] arrangements for the past 2 months: Single Family Home Expected Discharge Date: 01/03/21                                     Social Determinants of Health (SDOH) Interventions    Readmission Risk Interventions No flowsheet data found.

## 2021-01-04 LAB — GLUCOSE, CAPILLARY
Glucose-Capillary: 144 mg/dL — ABNORMAL HIGH (ref 70–99)
Glucose-Capillary: 86 mg/dL (ref 70–99)

## 2021-01-04 NOTE — Progress Notes (Signed)
Patient assisted to the commode. Patient voided light yellow urine. Patient is unsteady and needs hands-on assist. Patient remains confused and anxious, but is cooperative and pleasant.

## 2021-01-04 NOTE — Progress Notes (Signed)
Report given to [facility]. Currently still awaiting transportation.

## 2021-01-04 NOTE — Progress Notes (Signed)
Patient is concerned that her family is not here. Patient appears anxious about that. Patient is constantly questioning where her family is. Patient cannot be redirected, but can be soothed.

## 2021-01-04 NOTE — Care Management Important Message (Signed)
Important Message  Patient Details  Name: Joanna Reed MRN: [id number] Date of Birth: 07/09/1941   Medicare Important Message Given:  Other (see comment)  Patient is confused and I reached out to review the Important Message from Medicare with [name] [phone]) that is communicating with daughter in [place]. She asked if a second urine culture was done to see if UTI cleared up.  I contacted the RN CM, Pryor Montes and she was going to follow-up with [name].  Juliann Pulse A Emric Kowalewski 01/04/2021, 11:28 AM

## 2021-01-04 NOTE — Plan of Care (Signed)
Shift Summary: Pt oriented only to self, conversation remains scattered and tangential. Meds given w/o issue. Sleeping comfortably overnight. Fall/safety precautions in place, rounding performed, needs/concerns addressed.  Problem: Education: Goal: Knowledge of General Education information will improve Description: Including pain rating scale, medication(s)/side effects and non-pharmacologic comfort measures Outcome: Progressing   Problem: Health Behavior/Discharge Planning: Goal: Ability to manage health-related needs will improve Outcome: Progressing   Problem: Clinical Measurements: Goal: Ability to maintain clinical measurements within normal limits will improve Outcome: Progressing Goal: Will remain free from infection Outcome: Progressing Goal: Diagnostic test results will improve Outcome: Progressing Goal: Respiratory complications will improve Outcome: Progressing Goal: Cardiovascular complication will be avoided Outcome: Progressing   Problem: Activity: Goal: Risk for activity intolerance will decrease Outcome: Progressing   Problem: Nutrition: Goal: Adequate nutrition will be maintained Outcome: Progressing   Problem: Coping: Goal: Level of anxiety will decrease Outcome: Progressing   Problem: Elimination: Goal: Will not experience complications related to bowel motility Outcome: Progressing Goal: Will not experience complications related to urinary retention Outcome: Progressing   Problem: Pain Managment: Goal: General experience of comfort will improve Outcome: Progressing   Problem: Safety: Goal: Ability to remain free from injury will improve Outcome: Progressing   Problem: Skin Integrity: Goal: Risk for impaired skin integrity will decrease Outcome: Progressing

## 2021-01-04 NOTE — Care Management Important Message (Signed)
Important Message  Patient Details  Name: Joanna Reed MRN: [id number] Date of Birth: 1941-02-10   Medicare Important Message Given:  Yes  Reviewed the Important Message from Medicare with [name] [phone]).  While she still had concerns of uncertainly that the UTI was cleared up she wanted her to have a bed at local skilled facility.  RN CM talked with her earlier today regarding this concern.  I asked if she wanted a copy of this form and she replied yes.  I will send certified mail to: [address], [place] [address].  I thanked for her time.     [name] 01/04/2021, 1:56 PM

## 2021-01-04 NOTE — Progress Notes (Signed)
Attempted to call report to [facility]. Was placed on hold with no success. Will try again later.

## 2021-01-04 NOTE — Progress Notes (Signed)
Patient going to SNF.  Will need outpatient neurology follow up as well as psych for medication adjustments.  Would also encourage Po intake and nutritional supplements. Eulogio Bear DO

## 2021-01-04 NOTE — Progress Notes (Signed)
Patient given a lunch tray.  

## 2021-01-04 NOTE — Progress Notes (Signed)
Patient states she needed to void. Tech assisted patient to bedside commode, but patient did not void at that time. Patient remains confused and anxious,  but cooperative at this time

## 2021-01-04 NOTE — TOC Transition Note (Signed)
Transition of Care [facility]) - CM/SW Discharge Note   Patient Details  Name: Darika Ildefonso MRN: [id number] Date of Birth: 08-27-1941  [facility] of Care [facility]) CM/SW Contact:  Shelbie Hutching, RN Phone Number: 01/04/2021, 12:23 PM   Clinical Narrative:    Patient is medically stable for discharge to [facility] today.  Patient will be going to room 303B.  Bedside RN will call report to [phone].  RNCM is arranging EMS transport.  Patient's family is aware of discharge today.     Final next level of care: Skilled Nursing Facility Barriers to Discharge: Barriers Resolved   Patient Goals and CMS Choice Patient states their goals for this hospitalization and ongoing recovery are:: SNF rehab CMS Medicare.gov Compare Post Acute Care list provided to:: Patient Represenative (must comment) Choice offered to / list presented to : Adult Children  Discharge Placement              Patient chooses bed at: [facility] Patient to be transferred to facility by: [facility] Name of family member notified: Hoyle Sauer ( friend) and Cyril Mourning (daughter) Patient and family notified of of transfer: 01/04/21  Discharge Plan and Services                DME Arranged: N/A DME Agency: NA       HH Arranged: NA          Social Determinants of Health (SDOH) Interventions     Readmission Risk Interventions No flowsheet data found.

## 2021-01-05 LAB — VITAMIN B1: Vitamin B1 (Thiamine): 86 nmol/L (ref 66.5–200.0)

## 2022-09-15 ENCOUNTER — Emergency Department: Payer: Medicare PPO

## 2022-09-15 ENCOUNTER — Inpatient Hospital Stay
Admission: EM | Admit: 2022-09-15 | Discharge: 2022-09-19 | DRG: 884 | Disposition: A | Discharge status: Nursing Home (Includes ICF) | Payer: Medicare PPO | Source: Skilled Nursing Facility | Attending: Internal Medicine | Admitting: Internal Medicine

## 2022-09-15 ENCOUNTER — Other Ambulatory Visit: Payer: Self-pay

## 2022-09-15 DIAGNOSIS — R5381 Other malaise: Secondary | ICD-10-CM | POA: Diagnosis present

## 2022-09-15 DIAGNOSIS — F03911 Unspecified dementia, unspecified severity, with agitation: Secondary | ICD-10-CM | POA: Diagnosis present

## 2022-09-15 DIAGNOSIS — E860 Dehydration: Secondary | ICD-10-CM | POA: Diagnosis present

## 2022-09-15 DIAGNOSIS — Z87440 Personal history of urinary (tract) infections: Secondary | ICD-10-CM

## 2022-09-15 DIAGNOSIS — E876 Hypokalemia: Secondary | ICD-10-CM | POA: Diagnosis present

## 2022-09-15 DIAGNOSIS — E870 Hyperosmolality and hypernatremia: Secondary | ICD-10-CM | POA: Diagnosis present

## 2022-09-15 DIAGNOSIS — R4182 Altered mental status, unspecified: Secondary | ICD-10-CM | POA: Diagnosis not present

## 2022-09-15 DIAGNOSIS — W19XXXA Unspecified fall, initial encounter: Secondary | ICD-10-CM

## 2022-09-15 DIAGNOSIS — F0394 Unspecified dementia, unspecified severity, with anxiety: Secondary | ICD-10-CM | POA: Diagnosis present

## 2022-09-15 DIAGNOSIS — F0392 Unspecified dementia, unspecified severity, with psychotic disturbance: Secondary | ICD-10-CM | POA: Diagnosis present

## 2022-09-15 DIAGNOSIS — R410 Disorientation, unspecified: Secondary | ICD-10-CM

## 2022-09-15 DIAGNOSIS — F32A Depression, unspecified: Secondary | ICD-10-CM | POA: Diagnosis present

## 2022-09-15 DIAGNOSIS — F0393 Unspecified dementia, unspecified severity, with mood disturbance: Principal | ICD-10-CM | POA: Diagnosis present

## 2022-09-15 DIAGNOSIS — Z79899 Other long term (current) drug therapy: Secondary | ICD-10-CM

## 2022-09-15 DIAGNOSIS — I10 Essential (primary) hypertension: Secondary | ICD-10-CM | POA: Diagnosis present

## 2022-09-15 DIAGNOSIS — D649 Anemia, unspecified: Secondary | ICD-10-CM | POA: Diagnosis present

## 2022-09-15 DIAGNOSIS — N390 Urinary tract infection, site not specified: Secondary | ICD-10-CM | POA: Diagnosis present

## 2022-09-15 DIAGNOSIS — Z882 Allergy status to sulfonamides status: Secondary | ICD-10-CM

## 2022-09-15 LAB — COMPREHENSIVE METABOLIC PANEL
ALT: 12 U/L (ref 0–44)
AST: 17 U/L (ref 15–41)
Albumin: 4.2 g/dL (ref 3.5–5.0)
Alkaline Phosphatase: 67 U/L (ref 38–126)
Anion gap: 7 (ref 5–15)
BUN: 24 mg/dL — ABNORMAL HIGH (ref 8–23)
CO2: 27 mmol/L (ref 22–32)
Calcium: 9.5 mg/dL (ref 8.9–10.3)
Chloride: 107 mmol/L (ref 98–111)
Creatinine, Ser: 0.71 mg/dL (ref 0.44–1.00)
GFR, Estimated: >60 mL/min (ref >60)
Glucose, Bld: 99 mg/dL (ref 70–99)
Potassium: 3.6 mmol/L (ref 3.5–5.1)
Sodium: 141 mmol/L (ref 135–145)
Total Bilirubin: 0.5 mg/dL (ref 0.3–1.2)
Total Protein: 7.4 g/dL (ref 6.5–8.1)

## 2022-09-15 LAB — CBC WITH DIFFERENTIAL/PLATELET
Abs Immature Granulocytes: 0.04 10*3/uL (ref 0.00–0.07)
Basophils Absolute: 0.1 10*3/uL (ref 0.0–0.1)
Basophils Relative: 1 %
Eosinophils Absolute: 0.1 10*3/uL (ref 0.0–0.5)
Eosinophils Relative: 1 %
HCT: 34.7 % — ABNORMAL LOW (ref 36.0–46.0)
Hemoglobin: 11.5 g/dL — ABNORMAL LOW (ref 12.0–15.0)
Immature Granulocytes: 0 %
Lymphocytes Relative: 24 %
Lymphs Abs: 2.2 10*3/uL (ref 0.7–4.0)
MCH: 28.8 pg (ref 26.0–34.0)
MCHC: 33.1 g/dL (ref 30.0–36.0)
MCV: 87 fL (ref 80.0–100.0)
Monocytes Absolute: 0.7 10*3/uL (ref 0.1–1.0)
Monocytes Relative: 7 %
Neutro Abs: 6.2 10*3/uL (ref 1.7–7.7)
Neutrophils Relative %: 67 %
Platelets: 241 10*3/uL (ref 150–400)
RBC: 3.99 MIL/uL (ref 3.87–5.11)
RDW: 12.9 % (ref 11.5–15.5)
WBC: 9.2 10*3/uL (ref 4.0–10.5)
nRBC: 0 % (ref 0.0–0.2)

## 2022-09-15 LAB — LACTIC ACID, PLASMA: Lactic Acid, Venous: 0.9 mmol/L (ref 0.5–1.9)

## 2022-09-15 LAB — URINALYSIS, ROUTINE W REFLEX MICROSCOPIC
Bacteria, UA: NONE SEEN
Bilirubin Urine: NEGATIVE
Glucose, UA: NEGATIVE mg/dL
Hgb urine dipstick: NEGATIVE
Ketones, ur: NEGATIVE mg/dL
Nitrite: NEGATIVE
Protein, ur: NEGATIVE mg/dL
Specific Gravity, Urine: 1.004 — ABNORMAL LOW (ref 1.005–1.030)
Squamous Epithelial / HPF: NONE SEEN (ref 0–5)
pH: 7 (ref 5.0–8.0)

## 2022-09-15 LAB — D-DIMER, QUANTITATIVE: D-Dimer, Quant: 0.53 ug/mL-FEU — ABNORMAL HIGH (ref 0.00–0.50)

## 2022-09-15 MED ORDER — SODIUM CHLORIDE 0.9 % IV SOLN
1.0000 g | Freq: Once | INTRAVENOUS | Status: AC
  Administered 2022-09-15: 1 g via INTRAVENOUS
  Filled 2022-09-15: qty 10

## 2022-09-15 MED ORDER — LORAZEPAM 2 MG/ML IJ SOLN
1.0000 mg | Freq: Once | INTRAMUSCULAR | Status: AC
  Administered 2022-09-15: 1 mg via INTRAVENOUS
  Filled 2022-09-15: qty 1

## 2022-09-15 MED ORDER — SODIUM CHLORIDE 0.9 % IV BOLUS
500.0000 mL | Freq: Once | INTRAVENOUS | Status: AC
  Administered 2022-09-15: 500 mL via INTRAVENOUS

## 2022-09-15 MED ORDER — PANTOPRAZOLE SODIUM 40 MG IV SOLR
40.0000 mg | Freq: Two times a day (BID) | INTRAVENOUS | Status: DC
  Administered 2022-09-15 – 2022-09-16 (×2): 40 mg via INTRAVENOUS
  Filled 2022-09-15 (×2): qty 10

## 2022-09-15 MED ORDER — LACTATED RINGERS IV BOLUS
1000.0000 mL | Freq: Once | INTRAVENOUS | Status: AC
  Administered 2022-09-15: 1000 mL via INTRAVENOUS

## 2022-09-15 NOTE — ED Notes (Signed)
Pt leaving for CT.  

## 2022-09-15 NOTE — ED Notes (Signed)
Pt reports about 10 years ago started having memory issues; denies dementia.

## 2022-09-15 NOTE — ED Notes (Signed)
Provider CBT assessing pt.

## 2022-09-15 NOTE — Assessment & Plan Note (Signed)
Patient has a history of urinary tract infection was given Rocephin empirically as the source was unclear for her mentation. Will follow culture and sensitivity.

## 2022-09-15 NOTE — ED Provider Triage Note (Signed)
Emergency Medicine Provider Triage Evaluation Note  Joanna Reed , a 81 y.o. female  was evaluated in triage.  Pt unsure why she is here. Per nursing home staff at [facility] she is altered and hallucinating. Recently treated for UTI with 2 different antibiotics.  Physical Exam  BP 127/65 (BP Location: Left Arm)   Pulse 63   Temp 97.8 F (36.6 C) (Oral)   Resp 17   SpO2 99%  Gen:   Awake, no distress   Resp:  Normal effort  MSK:   Moves extremities without difficulty  Other:    Medical Decision Making  Medically screening exam initiated at 4:20 PM.  Appropriate orders placed.  Joanna Reed was informed that the remainder of the evaluation will be completed by another provider, this initial triage assessment does not replace that evaluation, and the importance of remaining in the ED until their evaluation is complete.    Victorino Dike, FNP 09/15/22 1625

## 2022-09-15 NOTE — H&P (Signed)
History and Physical    Chief Complaint: Altered mental status   HISTORY OF PRESENT ILLNESS: Joanna Reed is an 81 y.o. female brought to the emergency room for complaints of altered mental status patient has had similar episodes in the past where has been associated with urinary tract infection however today upon evaluation by EDMD lab work was within normal limits as was imaging and studies.  Admission requested for patient being hypertensive and altered mental status.  Pt has PMH as below: Past Medical History:  Diagnosis Date   Anxiety and depression    Review of Systems  All other systems reviewed and are negative.     Allergies  Allergen Reactions   Sulfa Antibiotics      Past Surgical History:  Procedure Laterality Date   ABDOMINAL HYSTERECTOMY     1980      Social History   Socioeconomic History   Marital status: Divorced    Spouse name: Not on file   Number of children: Not on file   Years of education: Not on file   Highest education level: Not on file  Occupational History   Not on file  Tobacco Use   Smoking status: Never   Smokeless tobacco: Never  Substance and Sexual Activity   Alcohol use: Not Currently    Comment: occ   Drug use: No   Sexual activity: Not on file  Other Topics Concern   Not on file  Social History Narrative   Enjoys traveling   Daughter lives in [place] with her husband   Has 2 grandchildren in [place] (boys, 2,4)   Social Determinants of Health   Financial Resource Strain: Not on file  Food Insecurity: Not on file  Transportation Needs: Not on file  Physical Activity: Not on file  Stress: Not on file  Social Connections: Not on file      CURRENT MEDS: Current Outpatient Medications (Hematological):    vitamin B-12 (CYANOCOBALAMIN) 500 MCG tablet, Take 1 tablet (500 mcg total) by mouth daily.  Current Facility-Administered Medications (Other):    pantoprazole (PROTONIX) injection 40 mg  Current  Outpatient Medications (Other):    buPROPion (WELLBUTRIN XL) 300 MG 24 hr tablet, TAKE 1 TABLET BY MOUTH EVERY DAY (Patient taking differently: Take 300 mg by mouth daily.)   citalopram (CELEXA) 20 MG tablet, TAKE 1 TABLET BY MOUTH EVERY DAY (Patient taking differently: Take 20 mg by mouth daily.)   haloperidol (HALDOL) 2 MG tablet, Take 1 tablet (2 mg total) by mouth every 6 (six) hours as needed for agitation.   hydrOXYzine (ATARAX/VISTARIL) 25 MG tablet, TAKE 1 TABLET (25 MG TOTAL) BY MOUTH AT BEDTIME AS NEEDED FOR ANXIETY (SLEEP).    ED Course: Pt in Ed awake, afebrile, O2 sats of 100% on room air. Vitals:   09/15/22 1616 09/15/22 1619 09/15/22 1821 09/15/22 2244  BP: 127/65  (!) 168/85 (!) 166/82  Pulse: 63  77 74  Resp: '17  17 18  '$ Temp: 97.8 F (36.6 C)  98.6 F (37 C) 98.6 F (37 C)  TempSrc: Oral  Oral Oral  SpO2: 99%  100% 100%  Weight:  46.3 kg    Height:  '5\' 2"'$  (1.575 m)     Total I/O In: 1600 [IV Piggyback:1600] Out: -  SpO2: 100 % Blood work in ed shows: Anemia with 11.5, normal CMP, normal urinalysis.Marland Kitchen  Results for orders placed or performed during the hospital encounter of 09/15/22 (from the past 48 hour(s))  CBC with Differential  Status: Abnormal   Collection Time: 09/15/22  4:25 PM  Result Value Ref Range   WBC 9.2 4.0 - 10.5 K/uL   RBC 3.99 3.87 - 5.11 MIL/uL   Hemoglobin 11.5 (L) 12.0 - 15.0 g/dL   HCT 34.7 (L) 36.0 - 46.0 %   MCV 87.0 80.0 - 100.0 fL   MCH 28.8 26.0 - 34.0 pg   MCHC 33.1 30.0 - 36.0 g/dL   RDW 12.9 11.5 - 15.5 %   Platelets 241 150 - 400 K/uL   nRBC 0.0 0.0 - 0.2 %   Neutrophils Relative % 67 %   Neutro Abs 6.2 1.7 - 7.7 K/uL   Lymphocytes Relative 24 %   Lymphs Abs 2.2 0.7 - 4.0 K/uL   Monocytes Relative 7 %   Monocytes Absolute 0.7 0.1 - 1.0 K/uL   Eosinophils Relative 1 %   Eosinophils Absolute 0.1 0.0 - 0.5 K/uL   Basophils Relative 1 %   Basophils Absolute 0.1 0.0 - 0.1 K/uL   Immature Granulocytes 0 %   Abs Immature  Granulocytes 0.04 0.00 - 0.07 K/uL    Comment: Performed at [facility], [address] [place], [place] [address]  Comprehensive metabolic panel     Status: Abnormal   Collection Time: 09/15/22  4:25 PM  Result Value Ref Range   Sodium 141 135 - 145 mmol/L   Potassium 3.6 3.5 - 5.1 mmol/L   Chloride 107 98 - 111 mmol/L   CO2 27 22 - 32 mmol/L   Glucose, Bld 99 70 - 99 mg/dL    Comment: Glucose reference range applies only to samples taken after fasting for at least 8 hours.   BUN 24 (H) 8 - 23 mg/dL   Creatinine, Ser 0.71 0.44 - 1.00 mg/dL   Calcium 9.5 8.9 - 10.3 mg/dL   Total Protein 7.4 6.5 - 8.1 g/dL   Albumin 4.2 3.5 - 5.0 g/dL   AST 17 15 - 41 U/L   ALT 12 0 - 44 U/L   Alkaline Phosphatase 67 38 - 126 U/L   Total Bilirubin 0.5 0.3 - 1.2 mg/dL   GFR, Estimated >60 >60 mL/min    Comment: (NOTE) Calculated using the CKD-EPI Creatinine Equation (2021)    Anion gap 7 5 - 15    Comment: Performed at [facility], [address] [place], [place] [address]  Lactic acid, plasma     Status: None   Collection Time: 09/15/22  7:56 PM  Result Value Ref Range   Lactic Acid, Venous 0.9 0.5 - 1.9 mmol/L    Comment: Performed at [facility], [address] [place], [place] [address]  Urinalysis, Routine w reflex microscopic     Status: Abnormal   Collection Time: 09/15/22  9:30 PM  Result Value Ref Range   Color, Urine COLORLESS (A) YELLOW   APPearance CLEAR (A) CLEAR   Specific Gravity, Urine 1.004 (L) 1.005 - 1.030   pH 7.0 5.0 - 8.0   Glucose, UA NEGATIVE NEGATIVE mg/dL   Hgb urine dipstick NEGATIVE NEGATIVE   Bilirubin Urine NEGATIVE NEGATIVE   Ketones, ur NEGATIVE NEGATIVE mg/dL   Protein, ur NEGATIVE NEGATIVE mg/dL   Nitrite NEGATIVE NEGATIVE   Leukocytes,Ua TRACE (A) NEGATIVE   RBC / HPF 0-5 0 - 5 RBC/hpf   WBC, UA 0-5 0 - 5 WBC/hpf   Bacteria, UA NONE SEEN NONE SEEN   Squamous Epithelial / LPF NONE SEEN 0 - 5   Mucus  PRESENT      Comment: Performed at [facility], [address] [place], [place] [address]  D-dimer, quantitative     Status: Abnormal   Collection Time: 09/15/22 11:10 PM  Result Value Ref Range   D-Dimer, Quant 0.53 (H) 0.00 - 0.50 ug/mL-FEU    Comment: (NOTE) At the manufacturer cut-off value of 0.5 g/mL FEU, this assay has a negative predictive value of 95-100%.This assay is intended for use in conjunction with a clinical pretest probability (PTP) assessment model to exclude pulmonary embolism (PE) and deep venous thrombosis (DVT) in outpatients suspected of PE or DVT. Results should be correlated with clinical presentation. Performed at [facility], [address] [place], [place] [address]   Troponin I (High Sensitivity)     Status: None   Collection Time: 09/15/22 11:10 PM  Result Value Ref Range   Troponin I (High Sensitivity) 5 <18 ng/L    Comment: (NOTE) Elevated high sensitivity troponin I (hsTnI) values and significant  changes across serial measurements may suggest ACS but many other  chronic and acute conditions are known to elevate hsTnI results.  Refer to the "Links" section for chest pain algorithms and additional  guidance. Performed at [facility], [address] [place], [place] [address]   CK     Status: None   Collection Time: 09/15/22 11:10 PM  Result Value Ref Range   Total CK 78 38 - 234 U/L    Comment: Performed at [facility], [address] [place], [place] [address]    In Ed pt received  Meds ordered this encounter  Medications   sodium chloride 0.9 % bolus 500 mL   LORazepam (ATIVAN) injection 1 mg   cefTRIAXone (ROCEPHIN) 1 g in sodium chloride 0.9 % 100 mL IVPB    Order Specific Question:   Antibiotic Indication:    Answer:   UTI   pantoprazole (PROTONIX) injection 40 mg   lactated ringers bolus 1,000 mL   haloperidol (HALDOL) tablet 2 mg   hydrOXYzine (ATARAX) tablet 25 mg   cyanocobalamin (VITAMIN B12)  tablet 500 mcg   citalopram (CELEXA) tablet 20 mg   buPROPion (WELLBUTRIN XL) 24 hr tablet 300 mg   heparin injection 5,000 Units   sodium chloride flush (NS) 0.9 % injection 3 mL   0.9 %  sodium chloride infusion   OR Linked Order Group    acetaminophen (TYLENOL) tablet 650 mg    acetaminophen (TYLENOL) suppository 650 mg   cefTRIAXone (ROCEPHIN) 1 g in sodium chloride 0.9 % 100 mL IVPB    Order Specific Question:   Antibiotic Indication:    Answer:   UTI    Unresulted Labs (From admission, onward)     Start     Ordered   09/16/22 0500  Comprehensive metabolic panel  Tomorrow morning,   STAT        09/16/22 0023   09/16/22 0500  CBC  Tomorrow morning,   STAT        09/16/22 0023   09/15/22 2346  Vitamin B12  Add-on,   AD        09/15/22 2345   09/15/22 2259  Type and screen  ONCE - STAT,   STAT        09/15/22 2259   09/15/22 2258  Brain natriuretic peptide  ONCE - URGENT,   URGENT        09/15/22 2257           Admission  Imaging : CT Head Wo Contrast  Result Date: 09/15/2022 CLINICAL DATA:  Mental status changes. EXAM: CT HEAD WITHOUT CONTRAST TECHNIQUE: Contiguous axial images were obtained from the base of the skull through the vertex without intravenous contrast. RADIATION DOSE REDUCTION: This exam was performed according to the departmental dose-optimization program which includes automated exposure control, adjustment of the mA and/or kV according to patient size and/or use of iterative reconstruction technique. COMPARISON:  12/29/2020 FINDINGS: Brain: No evidence of acute infarction, hemorrhage, hydrocephalus, extra-axial collection or mass lesion/mass effect. Diffuse loss of parenchymal volume is consistent with atrophy. Patchy low attenuation in the deep hemispheric and periventricular white matter is nonspecific, but likely reflects chronic microvascular ischemic demyelination. Vascular: No hyperdense vessel or unexpected calcification. Skull: No evidence for fracture.  No worrisome lytic or sclerotic lesion. 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 Visualized portions of the globes and intraorbital fat are unremarkable. Other: None. IMPRESSION: 1. No acute intracranial abnormality. 2. Atrophy with chronic small vessel ischemic disease. 3. 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 Electronically Signed   By: Misty Stanley M.D.   On: 09/15/2022 16:59      Physical Examination: Vitals:   09/15/22 1616 09/15/22 1619 09/15/22 1821 09/15/22 2244  BP: 127/65  (!) 168/85 (!) 166/82  Pulse: 63  77 74  Temp: 97.8 F (36.6 C)  98.6 F (37 C) 98.6 F (37 C)  Resp: '17  17 18  '$ Height:  '5\' 2"'$  (1.575 m)    Weight:  46.3 kg    SpO2: 99%  100% 100%  TempSrc: Oral  Oral Oral  BMI (Calculated):  18.65     Physical Exam Vitals and nursing note reviewed.  Constitutional:      General: She is not in acute distress.    Appearance: Normal appearance. She is not ill-appearing, toxic-appearing or diaphoretic.  HENT:     Head: Normocephalic and atraumatic.     Right Ear: Hearing and external ear normal.     Left Ear: Hearing and external ear normal.     Nose: Nose normal. No nasal deformity.     Mouth/Throat:     Lips: Pink.     Mouth: Mucous membranes are moist.     Tongue: No lesions.     Pharynx: Oropharynx is clear.  Eyes:     General: No visual field deficit.    Extraocular Movements: Extraocular movements intact.     Pupils: Pupils are equal, round, and reactive to light.  Neck:     Vascular: No carotid bruit.  Cardiovascular:     Rate and Rhythm: Normal rate and regular rhythm.     Pulses: Normal pulses.     Heart sounds: Normal heart sounds.  Pulmonary:     Effort: Pulmonary effort is normal.     Breath sounds: Normal breath sounds.  Abdominal:     General: Bowel sounds are normal. There is no distension.     Palpations: Abdomen is soft. There is no mass.     Tenderness: There is no abdominal tenderness. There is no guarding.      Hernia: No hernia is present.  Musculoskeletal:     Right lower leg: No edema.     Left lower leg: No edema.  Skin:    General: Skin is warm.  Neurological:     General: No focal deficit present.     Mental Status: She is alert. She is disoriented.     Cranial Nerves: Cranial nerves 2-12 are intact.  No cranial nerve deficit or facial asymmetry.     Motor: Motor function is intact. No weakness.  Psychiatric:        Attention and Perception: She is inattentive.        Speech: Speech normal.        Behavior: Behavior is cooperative.     Assessment and Plan: * AMS (altered mental status) Negative head CT. Suspect metabolic encephalopathy.  Possible under UTI. Will monitor electrolytes and supportive care.   Anemia Hemoglobin mildly low, patient has a history of anemia. Currently hemoglobin is normal which I suspect is due to dehydration. Patient received LR bolus in the emergency room 1 L.  Will follow kidney functions and blood pressures.  Fall at home, initial encounter Fall precautions. Orthostatic vitals negative.  UTI (urinary tract infection) Patient has a history of urinary tract infection was given Rocephin empirically as the source was unclear for her mentation. Will follow culture and sensitivity.    DVT prophylaxis:  SCD  Code Status:  Full code  Family Communication:  Delana Meyer: [phone].  Disposition Plan:  To be determined  Consults called:  None  Admission status: Observation  Unit/ Expected LOS: Med/tele 2 to 3 days.   Para Skeans MD [facility]  6 PM- 2 AM. Please contact me via secure Chat 6 PM-2 AM. [phone] ( Pager ) To contact the [facility] Attending or Consulting provider [place] or covering provider during after hours [place], for this patient.   Check the care team in [facility] and look for a) attending/consulting [facility] provider listed and b) the [facility] team listed Log into [email or web address] and use 's universal password  to access. If you do not have the password, please contact the hospital operator. Locate the [facility] provider you are looking for under [facility] and page to a number that you can be directly reached. If you still have difficulty reaching the provider, please page the [facility] (Director on Call) for the Hospitalists listed on amion for assistance. [email or web address] 09/16/2022, 12:32 AM

## 2022-09-15 NOTE — ED Notes (Signed)
EKG to EDP Siadecki in person.  

## 2022-09-15 NOTE — ED Provider Notes (Addendum)
[facility] Main Provider Note    Event Date/Time   First MD Initiated Contact with Patient 09/15/22 1818     (approximate)   History   Altered Mental Status   HPI  HPI limited due to cognitive impairment and possible altered mental status  Joanna Reed is a 81 y.o. female with a history of depression, anxiety, UTI, and cognitive impairment who presents with concern for altered mental status and UTI.  The paperwork from her from the facility states that she has been treated several times for UTIs which have recurred and she has been altered for the last few days.  The patient herself is unable to give much history but states she has been feeling "out of sorts" and has what she believes is a urinary infection.  I reviewed the past medical records.  The patient was most recently admitted in March of last year and per the hospitalist discharge summary from 01/03/2021 she presented with altered mental status after she was found on the ground by a neighbor.  She was treated for a UTI and acute metabolic encephalopathy thought to be due to the UTI.   Physical Exam   Triage Vital Signs: ED Triage Vitals  Enc Vitals Group     BP 09/15/22 1616 127/65     Pulse Rate 09/15/22 1616 63     Resp 09/15/22 1616 17     Temp 09/15/22 1616 97.8 F (36.6 C)     Temp Source 09/15/22 1616 Oral     SpO2 09/15/22 1616 99 %     Weight 09/15/22 1619 102 lb (46.3 kg)     Height 09/15/22 1619 '5\' 2"'$  (1.575 m)     Head Circumference --      Peak Flow --      Pain Score 09/15/22 1619 0     Pain Loc --      Pain Edu? --      Excl. in [place]? --     Most recent vital signs: Vitals:   09/15/22 1821 09/15/22 2244  BP: (!) 168/85 (!) 166/82  Pulse: 77 74  Resp: 17 18  Temp: 98.6 F (37 C) 98.6 F (37 C)  SpO2: 100% 100%     General: Alert, slightly confused and anxious appearing. CV:  Good peripheral perfusion.  Resp:  Normal effort.  Abd:  Soft and nontender.  No  distention.  Other:  Slightly dry mucous membranes.  No peripheral edema.  Motor intact in all extremities.  EOMI.  PERRLA.  No facial droop.  Normal speech.   ED Results / Procedures / Treatments   Labs (all labs ordered are listed, but only abnormal results are displayed) Labs Reviewed  CBC WITH DIFFERENTIAL/PLATELET - Abnormal; Notable for the following components:      Result Value   Hemoglobin 11.5 (*)    HCT 34.7 (*)    All other components within normal limits  COMPREHENSIVE METABOLIC PANEL - Abnormal; Notable for the following components:   BUN 24 (*)    All other components within normal limits  URINALYSIS, ROUTINE W REFLEX MICROSCOPIC - Abnormal; Notable for the following components:   Color, Urine COLORLESS (*)    APPearance CLEAR (*)    Specific Gravity, Urine 1.004 (*)    Leukocytes,Ua TRACE (*)    All other components within normal limits  LACTIC ACID, PLASMA  D-DIMER, QUANTITATIVE  BRAIN NATRIURETIC PEPTIDE  CK  CBG MONITORING, ED  TYPE AND SCREEN  TROPONIN I (  HIGH SENSITIVITY)     EKG  ED ECG REPORT I, Arta Silence, the attending physician, personally viewed and interpreted this ECG.  Date: 09/15/2022 EKG Time: 1636 Rate: 60 Rhythm: normal sinus rhythm QRS Axis: normal Intervals: normal ST/T Wave abnormalities: normal Narrative Interpretation: no evidence of acute ischemia; no significant change when compared to EKG of 12/29/2020    RADIOLOGY  CT head: I independently viewed and interpreted the images; there is no ICH.  Radiology report indicates no acute abnormality.  PROCEDURES:  Critical Care performed: No  Procedures   MEDICATIONS ORDERED IN ED: Medications  pantoprazole (PROTONIX) injection 40 mg (40 mg Intravenous Given 09/15/22 2310)  sodium chloride 0.9 % bolus 500 mL (0 mLs Intravenous Stopped 09/15/22 2141)  LORazepam (ATIVAN) injection 1 mg (1 mg Intravenous Given 09/15/22 1939)  cefTRIAXone (ROCEPHIN) 1 g in sodium  chloride 0.9 % 100 mL IVPB (0 g Intravenous Stopped 09/15/22 2141)  lactated ringers bolus 1,000 mL (1,000 mLs Intravenous New Bag/Given 09/15/22 2310)     IMPRESSION / MDM / ASSESSMENT AND PLAN / ED COURSE  I reviewed the triage vital signs and the nursing notes.  81 year old female with PMH as noted above presents with altered mental status and concern for possible recurrent UTI that has failed outpatient management.  On exam the patient is slightly confused although her baseline is unclear from the prior records.  Physical exam is otherwise unremarkable.  Her vital signs are normal except for hypertension.  Differential diagnosis includes, but is not limited to, UTI, other infection, dehydration, electrolyte abnormality, other metabolic disturbance, CNS cause.  CT head is negative for acute findings.  EKG is nonischemic.  Initial labs reveal normal electrolytes and no leukocytosis.  We will give fluids, empiric ceftriaxone, obtain a UA and a lactate, and reassess.  I anticipate admission.  Patient's presentation is most consistent with acute presentation with potential threat to life or bodily function.  The patient is on the cardiac monitor to evaluate for evidence of arrhythmia and/or significant heart rate changes.  ----------------------------------------- 11:17 PM on 09/15/2022 -----------------------------------------  Lactate is normal.  Urinalysis is negative.  The etiology of the patient's altered mental status is unclear, however she will need inpatient admission for further monitoring.  I consulted Dr. Posey Pronto from the hospitalist service; based on her discussion she agrees to admit the patient.  FINAL CLINICAL IMPRESSION(S) / ED DIAGNOSES   Final diagnoses:  Altered mental status, unspecified altered mental status type     Rx / DC Orders   ED Discharge Orders     None        Note:  This document was prepared using Dragon voice recognition software and may include  unintentional dictation errors.    Arta Silence, MD 09/15/22 Audria Nine    Arta Silence, MD 09/15/22 2317

## 2022-09-15 NOTE — ED Notes (Addendum)
Pt ambulatory up to nurses station complaining about wait time. Pt stating she wants to leave. This RN explaining this is against medical advise and explaining consequences of leaving. Pt reports this RN needs to find her a ride home. This RN explaining that she is leaving AMA but is coming from facility so facility would have to be contacted first. Pt getting irrational and agitated with this RN.

## 2022-09-15 NOTE — Assessment & Plan Note (Signed)
Negative head CT. Suspect metabolic encephalopathy.  Possible under UTI. Will monitor electrolytes and supportive care.

## 2022-09-15 NOTE — ED Triage Notes (Signed)
Pt here via [facility] from [facility] with a UTI. Pt is here for antibiotics.

## 2022-09-15 NOTE — ED Notes (Signed)
Orthostatic Vitals  Lying 129/91, P 76  Sitting 131/83, P72  Standing/ Patient unable to stand steadily

## 2022-09-15 NOTE — ED Notes (Signed)
Blood work sent to lab.

## 2022-09-15 NOTE — Assessment & Plan Note (Signed)
Hemoglobin mildly low, patient has a history of anemia. Currently hemoglobin is normal which I suspect is due to dehydration. Patient received LR bolus in the emergency room 1 L.  Will follow kidney functions and blood pressures.

## 2022-09-15 NOTE — Assessment & Plan Note (Addendum)
Fall precautions. Orthostatic vitals negative.

## 2022-09-16 ENCOUNTER — Encounter: Payer: Self-pay | Admitting: Internal Medicine

## 2022-09-16 DIAGNOSIS — E876 Hypokalemia: Secondary | ICD-10-CM | POA: Diagnosis present

## 2022-09-16 DIAGNOSIS — Z882 Allergy status to sulfonamides status: Secondary | ICD-10-CM | POA: Diagnosis not present

## 2022-09-16 DIAGNOSIS — Z79899 Other long term (current) drug therapy: Secondary | ICD-10-CM | POA: Diagnosis not present

## 2022-09-16 DIAGNOSIS — R410 Disorientation, unspecified: Secondary | ICD-10-CM | POA: Diagnosis not present

## 2022-09-16 DIAGNOSIS — F0394 Unspecified dementia, unspecified severity, with anxiety: Secondary | ICD-10-CM | POA: Diagnosis present

## 2022-09-16 DIAGNOSIS — F32A Depression, unspecified: Secondary | ICD-10-CM | POA: Diagnosis present

## 2022-09-16 DIAGNOSIS — R4182 Altered mental status, unspecified: Secondary | ICD-10-CM | POA: Diagnosis present

## 2022-09-16 DIAGNOSIS — Z87440 Personal history of urinary (tract) infections: Secondary | ICD-10-CM | POA: Diagnosis not present

## 2022-09-16 DIAGNOSIS — I10 Essential (primary) hypertension: Secondary | ICD-10-CM | POA: Diagnosis present

## 2022-09-16 DIAGNOSIS — R5381 Other malaise: Secondary | ICD-10-CM | POA: Diagnosis present

## 2022-09-16 DIAGNOSIS — F418 Other specified anxiety disorders: Secondary | ICD-10-CM | POA: Diagnosis not present

## 2022-09-16 DIAGNOSIS — N390 Urinary tract infection, site not specified: Secondary | ICD-10-CM | POA: Diagnosis present

## 2022-09-16 DIAGNOSIS — D649 Anemia, unspecified: Secondary | ICD-10-CM | POA: Diagnosis present

## 2022-09-16 DIAGNOSIS — F0392 Unspecified dementia, unspecified severity, with psychotic disturbance: Secondary | ICD-10-CM | POA: Diagnosis present

## 2022-09-16 DIAGNOSIS — F0393 Unspecified dementia, unspecified severity, with mood disturbance: Secondary | ICD-10-CM | POA: Diagnosis present

## 2022-09-16 DIAGNOSIS — E860 Dehydration: Secondary | ICD-10-CM | POA: Diagnosis present

## 2022-09-16 DIAGNOSIS — E870 Hyperosmolality and hypernatremia: Secondary | ICD-10-CM | POA: Diagnosis present

## 2022-09-16 DIAGNOSIS — F03911 Unspecified dementia, unspecified severity, with agitation: Secondary | ICD-10-CM | POA: Diagnosis present

## 2022-09-16 LAB — COMPREHENSIVE METABOLIC PANEL
ALT: 11 U/L (ref 0–44)
AST: 19 U/L (ref 15–41)
Albumin: 3.6 g/dL (ref 3.5–5.0)
Alkaline Phosphatase: 55 U/L (ref 38–126)
Anion gap: 4 — ABNORMAL LOW (ref 5–15)
BUN: 13 mg/dL (ref 8–23)
CO2: 27 mmol/L (ref 22–32)
Calcium: 8.9 mg/dL (ref 8.9–10.3)
Chloride: 115 mmol/L — ABNORMAL HIGH (ref 98–111)
Creatinine, Ser: 0.54 mg/dL (ref 0.44–1.00)
GFR, Estimated: >60 mL/min (ref >60)
Glucose, Bld: 108 mg/dL — ABNORMAL HIGH (ref 70–99)
Potassium: 3.4 mmol/L — ABNORMAL LOW (ref 3.5–5.1)
Sodium: 146 mmol/L — ABNORMAL HIGH (ref 135–145)
Total Bilirubin: 0.4 mg/dL (ref 0.3–1.2)
Total Protein: 6.5 g/dL (ref 6.5–8.1)

## 2022-09-16 LAB — CBC
HCT: 33.7 % — ABNORMAL LOW (ref 36.0–46.0)
Hemoglobin: 11.1 g/dL — ABNORMAL LOW (ref 12.0–15.0)
MCH: 28.3 pg (ref 26.0–34.0)
MCHC: 32.9 g/dL (ref 30.0–36.0)
MCV: 86 fL (ref 80.0–100.0)
Platelets: 205 10*3/uL (ref 150–400)
RBC: 3.92 MIL/uL (ref 3.87–5.11)
RDW: 12.7 % (ref 11.5–15.5)
WBC: 7.4 10*3/uL (ref 4.0–10.5)
nRBC: 0 % (ref 0.0–0.2)

## 2022-09-16 LAB — TROPONIN I (HIGH SENSITIVITY)
Troponin I (High Sensitivity): 5 ng/L (ref <18)
Troponin I (High Sensitivity): 5 ng/L (ref <18)
Troponin I (High Sensitivity): 5 ng/L (ref <18)

## 2022-09-16 LAB — CK: Total CK: 78 U/L (ref 38–234)

## 2022-09-16 LAB — TYPE AND SCREEN
ABO/RH(D): O POS
Antibody Screen: NEGATIVE

## 2022-09-16 LAB — MAGNESIUM: Magnesium: 2.1 mg/dL (ref 1.7–2.4)

## 2022-09-16 LAB — BRAIN NATRIURETIC PEPTIDE: B Natriuretic Peptide: 84.4 pg/mL (ref 0.0–100.0)

## 2022-09-16 MED ORDER — VITAMIN B-12 1000 MCG PO TABS
500.0000 ug | ORAL_TABLET | Freq: Every day | ORAL | Status: DC
  Administered 2022-09-16 – 2022-09-17 (×2): 500 ug via ORAL
  Filled 2022-09-16 (×2): qty 1

## 2022-09-16 MED ORDER — HALOPERIDOL 2 MG PO TABS
2.0000 mg | ORAL_TABLET | Freq: Four times a day (QID) | ORAL | Status: DC | PRN
  Administered 2022-09-16 (×2): 2 mg via ORAL
  Filled 2022-09-16 (×4): qty 1

## 2022-09-16 MED ORDER — SODIUM CHLORIDE 0.9 % IV SOLN: INTRAVENOUS | Status: DC

## 2022-09-16 MED ORDER — ACETAMINOPHEN 650 MG RE SUPP: 650.0000 mg | Freq: Four times a day (QID) | RECTAL | Status: DC | PRN

## 2022-09-16 MED ORDER — AMLODIPINE BESYLATE 5 MG PO TABS
5.0000 mg | ORAL_TABLET | Freq: Every day | ORAL | Status: DC
  Administered 2022-09-16 – 2022-09-19 (×4): 5 mg via ORAL
  Filled 2022-09-16 (×4): qty 1

## 2022-09-16 MED ORDER — SODIUM CHLORIDE 0.9 % IV SOLN
1.0000 g | INTRAVENOUS | Status: DC
  Administered 2022-09-16 – 2022-09-17 (×2): 1 g via INTRAVENOUS
  Filled 2022-09-16 (×3): qty 10

## 2022-09-16 MED ORDER — CITALOPRAM HYDROBROMIDE 20 MG PO TABS
20.0000 mg | ORAL_TABLET | Freq: Every day | ORAL | Status: DC
  Administered 2022-09-16 – 2022-09-17 (×2): 20 mg via ORAL
  Filled 2022-09-16 (×2): qty 1

## 2022-09-16 MED ORDER — HALOPERIDOL LACTATE 5 MG/ML IJ SOLN: 5.0000 mg | Freq: Four times a day (QID) | INTRAMUSCULAR | Status: DC | PRN

## 2022-09-16 MED ORDER — POTASSIUM CHLORIDE CRYS ER 20 MEQ PO TBCR
40.0000 meq | EXTENDED_RELEASE_TABLET | Freq: Two times a day (BID) | ORAL | Status: AC
  Administered 2022-09-16: 40 meq via ORAL
  Filled 2022-09-16: qty 2

## 2022-09-16 MED ORDER — HALOPERIDOL LACTATE 5 MG/ML IJ SOLN
5.0000 mg | Freq: Four times a day (QID) | INTRAMUSCULAR | Status: DC | PRN
  Administered 2022-09-16 – 2022-09-17 (×2): 5 mg via INTRAVENOUS
  Filled 2022-09-16 (×2): qty 1

## 2022-09-16 MED ORDER — SODIUM CHLORIDE 0.9% FLUSH
3.0000 mL | Freq: Two times a day (BID) | INTRAVENOUS | Status: DC
  Administered 2022-09-16 – 2022-09-19 (×7): 3 mL via INTRAVENOUS

## 2022-09-16 MED ORDER — LACTATED RINGERS IV SOLN: INTRAVENOUS | Status: DC

## 2022-09-16 MED ORDER — ACETAMINOPHEN 325 MG PO TABS
650.0000 mg | ORAL_TABLET | Freq: Four times a day (QID) | ORAL | Status: DC | PRN
  Administered 2022-09-17 – 2022-09-18 (×2): 650 mg via ORAL
  Filled 2022-09-16 (×2): qty 2

## 2022-09-16 MED ORDER — HYDROXYZINE HCL 25 MG PO TABS
25.0000 mg | ORAL_TABLET | Freq: Every evening | ORAL | Status: DC | PRN
  Administered 2022-09-16: 25 mg via ORAL
  Filled 2022-09-16: qty 1

## 2022-09-16 MED ORDER — BUPROPION HCL ER (XL) 150 MG PO TB24
300.0000 mg | ORAL_TABLET | Freq: Every day | ORAL | Status: DC
  Administered 2022-09-16 – 2022-09-17 (×2): 300 mg via ORAL
  Filled 2022-09-16 (×2): qty 2

## 2022-09-16 MED ORDER — DONEPEZIL HCL 5 MG PO TABS
10.0000 mg | ORAL_TABLET | Freq: Every day | ORAL | Status: DC
  Administered 2022-09-16 – 2022-09-19 (×4): 10 mg via ORAL
  Filled 2022-09-16 (×4): qty 2

## 2022-09-16 MED ORDER — HEPARIN SODIUM (PORCINE) 5000 UNIT/ML IJ SOLN
5000.0000 [IU] | Freq: Three times a day (TID) | INTRAMUSCULAR | Status: DC
  Administered 2022-09-16 – 2022-09-19 (×10): 5000 [IU] via SUBCUTANEOUS
  Filled 2022-09-16 (×10): qty 1

## 2022-09-16 NOTE — ED Notes (Signed)
Pt's breakfast tray here and pt was encouraged to eat.

## 2022-09-16 NOTE — ED Notes (Addendum)
Pt assisted to restroom, pt urinated and had a small BM.   Pt is given water and also offered breakfast tray. Pt refusing vitals at this time because "I don't know why you need them and I don't know why I am here".

## 2022-09-16 NOTE — ED Notes (Signed)
Pt assisted with calling her friend "carolyn' on the phone.

## 2022-09-16 NOTE — ED Notes (Signed)
Patient has been active the majority of the shift, bed alarm in place and sitting in front of the nurses station. Patient has needed to be redirected verbally and physically multiple times from attempts to get out of bed without assistance.

## 2022-09-16 NOTE — Progress Notes (Signed)
PT Cancellation Note  Patient Details Name: Joanna Reed MRN: [id number] DOB: 01/06/1941   Cancelled Treatment:    Reason Eval/Treat Not Completed: Other (comment). Pt adamantly refusing to participate in PT, reporting that she could walk but did not want to today. Pt also very disoriented and talking about random, nonsensical topics. Pt's RN reporting that she has ambulated to the bathroom and back with +1 assist. PT will continue to follow-up with pt acutely as available and appropriate.    [place] 09/16/2022, 1:56 PM

## 2022-09-16 NOTE — ED Notes (Signed)
Pt keeps stating she is hungry, pt is reassured she has her breakfast tray at bedside and that lunch will be here shortly.

## 2022-09-16 NOTE — ED Notes (Signed)
Pt restlessness has increased. Messaged MD for IV haldol. No response yet.

## 2022-09-16 NOTE — ED Notes (Signed)
Pt very restless. PRN haldol order in place. [facility] pharmacy to send down.

## 2022-09-16 NOTE — ED Notes (Signed)
Pt assisted to restroom to urinate. Lunch tray given to pt, pt assisted with condiments. Pt eating at this time.

## 2022-09-16 NOTE — ED Notes (Signed)
Daughter called. Pt baseline is abrupt and asks lots of questions. She does have dementia and does not make much sense most of the time. She just wanted to know an update and how long we would be keeping her. Daughter is in [place]. Daughter advised she had been hallucinating as well and was curious as to that getting better or would it remain the same. RN informed her it depends on the patient and their progression of their dementia.

## 2022-09-16 NOTE — Progress Notes (Addendum)
PROGRESS NOTE    Joanna Reed  TWS:[id number] DOB: 1941-01-21 DOA: 09/15/2022 PCP: Pleas Koch, NP   Brief Narrative:  Patient is 81 year old female with past medical history of anxiety with depression, recurrent UTI brought by EMS from [facility] for altered mental status likely in the setting of UTI.  Patient requiring admission in March 2022 with altered mental status due to underlying UTI.  Upon arrival: Patient afebrile, no leukocytosis.  H&H is stable at baseline.  UA shows trace leukocytes.  CT head negative for any acute findings.  [facility] hospital[facility] consulted for admission due to altered mental status and elevated BP.  Assessment & Plan:  Altered mental status: -Unknown etiology.  CT head negative for any acute findings.  UA positive for trace leukocyte.  Patient is afebrile with no leukocytosis.  Will send urine for the culture.  Patient started on Rocephin in ED for possible underlying UTI-will continue same -No previous history of dementia as per patient's friend. She is alert and oriented at baseline. She comes confused and started hallucination whenever she has underlying UTI. -PT/OT consulted -Fall precautions  Hypertension: Will resume amlodipine.  Continue to monitor blood pressure closely  Normocytic anemia: H&H is baseline.  Continue to monitor  Anxiety with depression: Continue home meds Wellbutrin, Celexa, donepezil, hydroxyzine as needed at bedtime  Hypernatremia: Likely dehydration.  Will continue to monitor  Hypokalemia: Replenished.  Repeat BMP tomorrow a.m.  DVT prophylaxis: Heparin Code Status: Full code Family Communication:  None present at bedside.  Plan of care discussed with patient in length and she verbalized understanding and agreed with it.  I called patient's friend who is POA and updated her and she verbalized understanding  Disposition Plan: To be determined  Consultants:  None  Procedures:  None  Antimicrobials:   Rocephin  Status is: Observation   Subjective: Patient seen and examined.  Awake and alert however appears to be confused, disoriented and talking randomly.  She denies any complaints.  No fever, abdominal pain or headache.  I talked to patient's friend who is POA.  She reports that patient does not have diagnosis of dementia.  She has hallucination and gets confused whenever she has underlying UTI.  She had UTI several weeks ago that was treated with p.o. antibiotics at [facility].  She started hallucination again last week therefore she was sent to the emergency department for IV antibiotics for possible underlying UTI.  At baseline: She is alert and oriented.  She has 1 daughter who lives in [place].  Objective: Vitals:   09/15/22 2244 09/16/22 0336 09/16/22 0529 09/16/22 1428  BP: (!) 166/82  (!) 159/88 (!) 153/82  Pulse: 74  77 82  Resp: '18  18 18  '$ Temp: 98.6 F (37 C) 98.5 F (36.9 C) 98.6 F (37 C) 98.2 F (36.8 C)  TempSrc: Oral Oral Oral Oral  SpO2: 100%  100% 99%  Weight:      Height:        Intake/Output Summary (Last 24 hours) at 09/16/2022 1451 Last data filed at 09/16/2022 0026 Gross per 24 hour  Intake 1600 ml  Output --  Net 1600 ml   Filed Weights   09/15/22 1619  Weight: 46.3 kg    Examination:  General exam: Appears calm and comfortable, elderly female, sitting comfortably on the bed.  On room air. Respiratory system: Clear to auscultation. Respiratory effort normal. Cardiovascular system: S1 & S2 heard, RRR. No JVD, murmurs, rubs, gallops or clicks. No pedal edema.  Gastrointestinal system: Abdomen is nondistended, soft and nontender. No organomegaly or masses felt. Normal bowel sounds heard. Central nervous system: Alert, disoriented.  Moves all extremities equally. Skin: No rashes, lesions or ulcers    Data Reviewed: I have personally reviewed following labs and imaging studies  CBC: Recent Labs  Lab 09/15/22 1625 09/16/22 0530  WBC  9.2 7.4  NEUTROABS 6.2  --   HGB 11.5* 11.1*  HCT 34.7* 33.7*  MCV 87.0 86.0  PLT 241 295   Basic Metabolic Panel: Recent Labs  Lab 09/15/22 1625 09/16/22 0530  NA 141 146*  K 3.6 3.4*  CL 107 115*  CO2 27 27  GLUCOSE 99 108*  BUN 24* 13  CREATININE 0.71 0.54  CALCIUM 9.5 8.9  MG  --  2.1   GFR: Estimated Creatinine Clearance: 39.6 mL/min (by C-G formula based on SCr of 0.54 mg/dL). Liver Function Tests: Recent Labs  Lab 09/15/22 1625 09/16/22 0530  AST 17 19  ALT 12 11  ALKPHOS 67 55  BILITOT 0.5 0.4  PROT 7.4 6.5  ALBUMIN 4.2 3.6   No results for input(s): "LIPASE", "AMYLASE" in the last 168 hours. No results for input(s): "AMMONIA" in the last 168 hours. Coagulation Profile: No results for input(s): "INR", "PROTIME" in the last 168 hours. Cardiac Enzymes: Recent Labs  Lab 09/15/22 2310  CKTOTAL 78   BNP (last 3 results) No results for input(s): "PROBNP" in the last 8760 hours. HbA1C: No results for input(s): "HGBA1C" in the last 72 hours. CBG: No results for input(s): "GLUCAP" in the last 168 hours. Lipid Profile: No results for input(s): "CHOL", "HDL", "LDLCALC", "TRIG", "CHOLHDL", "LDLDIRECT" in the last 72 hours. Thyroid Function Tests: No results for input(s): "TSH", "T4TOTAL", "FREET4", "T3FREE", "THYROIDAB" in the last 72 hours. Anemia Panel: No results for input(s): "VITAMINB12", "FOLATE", "FERRITIN", "TIBC", "IRON", "RETICCTPCT" in the last 72 hours. Sepsis Labs: Recent Labs  Lab 09/15/22 1956  LATICACIDVEN 0.9    No results found for this or any previous visit (from the past 240 hour(s)).    Radiology Studies: CT Head Wo Contrast  Result Date: 09/15/2022 CLINICAL DATA:  Mental status changes. EXAM: CT HEAD WITHOUT CONTRAST TECHNIQUE: Contiguous axial images were obtained from the base of the skull through the vertex without intravenous contrast. RADIATION DOSE REDUCTION: This exam was performed according to the departmental  dose-optimization program which includes automated exposure control, adjustment of the mA and/or kV according to patient size and/or use of iterative reconstruction technique. COMPARISON:  12/29/2020 FINDINGS: Brain: No evidence of acute infarction, hemorrhage, hydrocephalus, extra-axial collection or mass lesion/mass effect. Diffuse loss of parenchymal volume is consistent with atrophy. Patchy low attenuation in the deep hemispheric and periventricular white matter is nonspecific, but likely reflects chronic microvascular ischemic demyelination. Vascular: No hyperdense vessel or unexpected calcification. Skull: No evidence for fracture. No worrisome lytic or sclerotic lesion. 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 Visualized portions of the globes and intraorbital fat are unremarkable. Other: None. IMPRESSION: 1. No acute intracranial abnormality. 2. Atrophy with chronic small vessel ischemic disease. 3. 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 Electronically Signed   By: Misty Stanley M.D.   On: 09/15/2022 16:59    Scheduled Meds:  buPROPion  300 mg Oral Daily   citalopram  20 mg Oral Daily   cyanocobalamin  500 mcg Oral Daily   heparin  5,000 Units Subcutaneous Q8H   pantoprazole (PROTONIX) IV  40 mg Intravenous Q12H   sodium chloride flush  3  mL Intravenous Q12H   Continuous Infusions:  sodium chloride 75 mL/hr at 09/16/22 0103   cefTRIAXone (ROCEPHIN)  IV       LOS: 0 days   Time spent: 35 minutes   Londa Mackowski Loann Quill, MD [facility]  If 7PM-7AM, please contact night-coverage [email or web address] 09/16/2022, 2:51 PM

## 2022-09-17 DIAGNOSIS — F418 Other specified anxiety disorders: Secondary | ICD-10-CM

## 2022-09-17 DIAGNOSIS — E876 Hypokalemia: Secondary | ICD-10-CM | POA: Diagnosis not present

## 2022-09-17 DIAGNOSIS — I10 Essential (primary) hypertension: Secondary | ICD-10-CM

## 2022-09-17 LAB — URINE CULTURE: Culture: <10000 — AB

## 2022-09-17 LAB — BASIC METABOLIC PANEL
Anion gap: 4 — ABNORMAL LOW (ref 5–15)
BUN: 16 mg/dL (ref 8–23)
CO2: 28 mmol/L (ref 22–32)
Calcium: 8.9 mg/dL (ref 8.9–10.3)
Chloride: 112 mmol/L — ABNORMAL HIGH (ref 98–111)
Creatinine, Ser: 0.68 mg/dL (ref 0.44–1.00)
GFR, Estimated: >60 mL/min (ref >60)
Glucose, Bld: 89 mg/dL (ref 70–99)
Potassium: 3.3 mmol/L — ABNORMAL LOW (ref 3.5–5.1)
Sodium: 144 mmol/L (ref 135–145)

## 2022-09-17 LAB — CBC
HCT: 31.6 % — ABNORMAL LOW (ref 36.0–46.0)
Hemoglobin: 10.5 g/dL — ABNORMAL LOW (ref 12.0–15.0)
MCH: 28.5 pg (ref 26.0–34.0)
MCHC: 33.2 g/dL (ref 30.0–36.0)
MCV: 85.9 fL (ref 80.0–100.0)
Platelets: 214 10*3/uL (ref 150–400)
RBC: 3.68 MIL/uL — ABNORMAL LOW (ref 3.87–5.11)
RDW: 12.9 % (ref 11.5–15.5)
WBC: 6.4 10*3/uL (ref 4.0–10.5)
nRBC: 0 % (ref 0.0–0.2)

## 2022-09-17 LAB — TSH: TSH: 1.228 u[IU]/mL (ref 0.350–4.500)

## 2022-09-17 LAB — MAGNESIUM: Magnesium: 2 mg/dL (ref 1.7–2.4)

## 2022-09-17 LAB — VITAMIN B12: Vitamin B-12: 472 pg/mL (ref 180–914)

## 2022-09-17 LAB — AMMONIA: Ammonia: <10 umol/L (ref 9–35)

## 2022-09-17 MED ORDER — SERTRALINE HCL 50 MG PO TABS
200.0000 mg | ORAL_TABLET | Freq: Every day | ORAL | Status: DC
  Administered 2022-09-17 – 2022-09-18 (×2): 200 mg via ORAL
  Filled 2022-09-17 (×2): qty 4

## 2022-09-17 MED ORDER — FOLIC ACID 1 MG PO TABS
1.0000 mg | ORAL_TABLET | Freq: Every day | ORAL | Status: DC
  Administered 2022-09-17 – 2022-09-19 (×3): 1 mg via ORAL
  Filled 2022-09-17 (×3): qty 1

## 2022-09-17 MED ORDER — QUETIAPINE FUMARATE 25 MG PO TABS
25.0000 mg | ORAL_TABLET | Freq: Every day | ORAL | Status: DC
  Administered 2022-09-17 – 2022-09-18 (×2): 25 mg via ORAL
  Filled 2022-09-17 (×2): qty 1

## 2022-09-17 MED ORDER — POTASSIUM CHLORIDE CRYS ER 20 MEQ PO TBCR
40.0000 meq | EXTENDED_RELEASE_TABLET | Freq: Two times a day (BID) | ORAL | Status: AC
  Administered 2022-09-17 (×2): 40 meq via ORAL
  Filled 2022-09-17 (×2): qty 2

## 2022-09-17 NOTE — Progress Notes (Addendum)
PROGRESS NOTE    Joanna Reed  XKG:[id number] DOB: 29-Dec-1940 DOA: 09/15/2022 PCP: Pleas Koch, NP   Brief Narrative:  Patient is 81 year old female with past medical history of anxiety with depression, recurrent UTI brought by EMS from [facility] for altered mental status likely in the setting of UTI.  Patient requiring admission in March 2022 with altered mental status due to underlying UTI.  Upon arrival: Patient afebrile, no leukocytosis.  H&H is stable at baseline.  UA shows trace leukocytes.  CT head negative for any acute findings.  [facility] hospitalist consulted for admission due to altered mental status and elevated BP.  Assessment & Plan:  Altered mental status: -Unknown etiology.  Could be delirium.  - CT head negative for any acute findings.  UA positive for trace leukocyte.  Patient is afebrile with no leukocytosis.  UC pending.  Patient started on Rocephin in ED for possible underlying UTI-will continue same -Ordered delirium precautions.  Will add Seroquel at bedtime.  Haldol as needed for agitation. -PT/OT consulted-recomended SNF -Fall precautions  Hypertension: Continue amlodipine.  Normocytic anemia: H&H is baseline.  Continue to monitor  Anxiety with depression: Continue home meds Zoloft, Aricept.  Hypernatremia: Likely dehydration.  Resolved  Hypokalemia: Replenished.  Repeat BMP tomorrow a.m. magnesium: WNL  Disposition: As per daughter patient is confused at baseline however she only has hallucination whenever she gets the UTI.  Daughter refused SNF at this time. Daughter states patient can be transfer to a higher level of care when she returns to [facility] that will include more clinical supervision along with PT. TOC aware and will follow up with the facility tomorrow.  DVT prophylaxis: Heparin Code Status: Full code Family Communication:  None present at bedside.  Plan of care discussed with patient in length and she verbalized understanding  and agreed with it.  I called patient's friend who is POA on 11/25 and 11/26 and updated her and she verbalized understanding  Disposition Plan: SNF  Consultants:  None  Procedures:  None  Antimicrobials:  Rocephin  Status is: Inpatient   Subjective: Patient seen and examined.  Awake but appears confused and delirious.  She is not sure why she is in the hospital.  She thinks that everybody is trying to hurt her.  She is not happy that she has mittens in her hands.  No fever.    She was on Haldol last evening due to agitation. Objective: Vitals:   09/16/22 2102 09/17/22 0359 09/17/22 0500 09/17/22 0906  BP: (!) 147/85 120/62  (!) 145/71  Pulse: 79 97  79  Resp: '17 17  18  '$ Temp: 98.4 F (36.9 C) 98.4 F (36.9 C)  98 F (36.7 C)  TempSrc:      SpO2: 96% 98%  96%  Weight:   48.4 kg   Height:        Intake/Output Summary (Last 24 hours) at 09/17/2022 1339 Last data filed at 09/16/2022 2124 Gross per 24 hour  Intake 100 ml  Output --  Net 100 ml    Filed Weights   09/15/22 1619 09/17/22 0500  Weight: 46.3 kg 48.4 kg    Examination:  General exam: Appears  elderly female, laying comfortably on the bed.  On room air. Respiratory system: Clear to auscultation. Respiratory effort normal. Cardiovascular system: S1 & S2 heard, RRR. No JVD, murmurs, rubs, gallops or clicks. No pedal edema. Gastrointestinal system: Abdomen is nondistended, soft and nontender. No organomegaly or masses felt. Normal bowel sounds heard.  Central nervous system: Alert, and awake but not oriented to time place and person.  Confused, has mittens in her hands. Skin: No rashes, lesions or ulcers    Data  Reviewed: I have personally reviewed following labs and imaging studies  CBC: Recent Labs  Lab 09/15/22 1625 09/16/22 0530 09/17/22 0455  WBC 9.2 7.4 6.4  NEUTROABS 6.2  --   --   HGB 11.5* 11.1* 10.5*  HCT 34.7* 33.7* 31.6*  MCV 87.0 86.0 85.9  PLT 241 205 865    Basic Metabolic Panel: Recent Labs  Lab 09/15/22 1625 09/16/22 0530 09/17/22 0455  NA 141 146* 144  K 3.6 3.4* 3.3*  CL 107 115* 112*  CO2 '27 27 28  '$ GLUCOSE 99 108* 89  BUN 24* 13 16  CREATININE 0.71 0.54 0.68  CALCIUM 9.5 8.9 8.9  MG  --  2.1 2.0    GFR: Estimated Creatinine Clearance: 41.4 mL/min (by C-G formula based on SCr of 0.68 mg/dL). Liver Function Tests: Recent Labs  Lab 09/15/22 1625 09/16/22 0530  AST 17 19  ALT 12 11  ALKPHOS 67 55  BILITOT 0.5 0.4  PROT 7.4 6.5  ALBUMIN 4.2 3.6    No results for input(s): "LIPASE", "AMYLASE" in the last 168 hours. No results for input(s): "AMMONIA" in the last 168 hours. Coagulation Profile: No results for input(s): "INR", "PROTIME" in the last 168 hours. Cardiac Enzymes: Recent Labs  Lab 09/15/22 2310  CKTOTAL 78    BNP (last 3 results) No results for input(s): "PROBNP" in the last 8760 hours. HbA1C: No results for input(s): "HGBA1C" in the last 72 hours. CBG: No results for input(s): "GLUCAP" in the last 168 hours. Lipid Profile: No results for input(s): "CHOL", "HDL", "LDLCALC", "TRIG", "CHOLHDL", "LDLDIRECT" in the last 72 hours. Thyroid Function Tests: No results for input(s): "TSH", "T4TOTAL", "FREET4", "T3FREE", "THYROIDAB" in the last 72 hours. Anemia Panel: Recent Labs    09/17/22 0455  VITAMINB12 472   Sepsis Labs: Recent Labs  Lab 09/15/22 1956  LATICACIDVEN 0.9     No results found for this or any previous visit (from the past 240 hour(s)).    Radiology Studies: CT Head Wo Contrast  Result Date: 09/15/2022 CLINICAL DATA:  Mental status changes. EXAM: CT HEAD WITHOUT CONTRAST TECHNIQUE: Contiguous axial images  were obtained from the base of the skull through the vertex without intravenous contrast. RADIATION DOSE REDUCTION: This exam was performed according to the departmental dose-optimization program which includes automated exposure control, adjustment of the mA and/or kV according to patient size and/or use of iterative reconstruction technique. COMPARISON:  12/29/2020 FINDINGS: Brain: No evidence of acute infarction, hemorrhage, hydrocephalus, extra-axial collection or mass lesion/mass effect. Diffuse loss of parenchymal volume is consistent with atrophy. Patchy low attenuation in the deep hemispheric and periventricular white matter is nonspecific, but likely reflects chronic microvascular ischemic demyelination. Vascular: No hyperdense vessel or unexpected calcification. Skull: No evidence for fracture. No worrisome lytic or sclerotic lesion. 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 Visualized portions of the globes and intraorbital fat are unremarkable. Other: None. IMPRESSION: 1. No acute intracranial abnormality. 2. Atrophy with chronic small vessel ischemic disease. 3. 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 Electronically Signed   By: Misty Stanley M.D.   On: 09/15/2022 16:59    Scheduled Meds:  amLODipine  5 mg Oral Daily   donepezil  10 mg Oral Daily   folic acid  1 mg Oral Daily   heparin  5,000 Units Subcutaneous  Q8H   potassium chloride  40 mEq Oral BID   sertraline  200 mg Oral QHS   sodium chloride flush  3 mL Intravenous Q12H   Continuous Infusions:  cefTRIAXone (ROCEPHIN)  IV Stopped (09/16/22 2006)   lactated ringers 75 mL/hr at 09/17/22 1013     LOS: 1 day   Time spent: 35 minutes   Zeddie Njie Loann Quill, MD [facility]  If 7PM-7AM, please contact night-coverage [email or web address] 09/17/2022, 1:39 PM

## 2022-09-17 NOTE — TOC Initial Note (Signed)
Transition of Care [facility]) - Initial/Assessment Note    Patient Details  Name: Joanna Reed MRN: [id number] Date of Birth: August 01, 1941  Transition of [facility]) CM/SW Contact:    Valente David, RN Phone Number: 09/17/2022, 2:48 PM  Clinical Narrative:                  Spoke with [name], patient's POA, who lives in [place].  Patient's daughter lives in [place].  Patient is resident at [facility] for ALF.  This was confirmed by [facility] [place].  Per Brayton Layman, patient was receiving PT at the facility through [facility].  She is not aware of the details on frequency, but state they will be available tomorrow to discuss. She also state that someone will be available to come to hospital for patient face to face assessment of needs as well, TOC team will contact tomorrow.  Per Hoyle Sauer, patient's PCP is Dr. Carlis Abbott and [facility]  Transportation is provided by facility.  Wynona Dove of PT recommendations for SNF placement for short term rehab.  She is hesitant stating they did not have a good experience with SNF in the past.  She would like to speak to daughter first, will call her later today, to discuss recommendations.  Prefer to see how often facility is currently providing therapy sessions and see if patient can maximize on current treatment before deciding to send to SNF.  Advised that there is concern about patient's safety as she is currently confused.  Discussed possibly needing additional private duty caregivers if she does not discharge to SNF.  Again, she would like to discuss with daughter and have TOC team follow up with her tomorrow.  Expected Discharge Plan: Skilled Nursing Facility Barriers to Discharge: Continued Medical Work up   Patient Goals and CMS Choice Patient states their goals for this hospitalization and ongoing recovery are:: Per Friend, would like patient to discharge back to [facility] for ALF CMS Medicare.gov Compare Post Acute Care list provided  to:: Patient Represenative (must comment) Hoyle Sauer) Choice offered to / list presented to : [place] / [facility]  Expected Discharge Plan and Services Expected Discharge Plan: [place] arrangements for the past 2 months: [place]                                      Prior Living Arrangements/Services Living arrangements for the past 2 months: [place] Lives with:: Facility Resident Patient language and need for interpreter reviewed:: No Do you feel safe going back to the place where you live?: Yes      Need for Family Participation in Patient Care: No (Comment) Care giver support system in place?: Yes (comment)   Criminal Activity/Legal Involvement Pertinent to Current Situation/Hospitalization: No - Comment as needed  Activities of Daily Living Home Assistive Devices/Equipment: None ADL Screening (condition at time of admission) Patient's cognitive ability adequate to safely complete daily activities?: No Is the patient deaf or have difficulty hearing?: Yes Does the patient have difficulty seeing, even when wearing glasses/contacts?: No Does the patient have difficulty concentrating, remembering, or making decisions?: Yes Patient able to express need for assistance with ADLs?: No Does the patient have difficulty dressing or bathing?: No Independently performs ADLs?: No Does the patient have difficulty walking or climbing stairs?: Yes Weakness of Legs: Both Weakness of Arms/Hands: None  Permission Sought/Granted   Permission granted to share information with : Yes, Verbal Permission Granted              Emotional Assessment           Psych Involvement: No (comment)  Admission diagnosis:  Altered mental status, unspecified altered mental status type [R41.82] AMS (altered mental status) [R41.82] Patient Active Problem List   Diagnosis Date Noted   AMS (altered mental status) 09/15/2022   Anemia  09/15/2022   Generalized anxiety disorder 12/31/2020   Dysthymia 12/31/2020   Cognitive impairment 12/31/2020   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   UTI (urinary tract infection) 12/29/2020   Fall at home, initial encounter 44/31/5400   Acute metabolic encephalopathy 86/76/1950   Pressure injury of skin 12/29/2020   Preventative health care 03/19/2018   Chronic knee pain 02/27/2017   Medicare annual wellness visit, subsequent 01/28/2015   Anxiety and depression 01/19/2015   Acute shoulder pain 01/19/2015   PCP:  Pleas Koch, NP [id number] [id number]: [phone]: [phone]    Social Determinants of Health (SDOH) Interventions    Readmission Risk Interventions     No data to display

## 2022-09-17 NOTE — Plan of Care (Signed)

## 2022-09-17 NOTE — Evaluation (Signed)
Occupational Therapy Evaluation Patient Details Name: Joanna Reed MRN: [id number] DOB: [birth year] Today's Date: 09/17/2022   History of Present Illness Pt is an 81 y/o F admitted on 09/15/22 after presenting to the ED with c/o AMS of unknown etiology. PMH: anxiety, depression, recurrent UTI. Pt lives at [place]. Per medical record, pt's friend is POA and states that pt does not have any dementia at baseline.   Clinical Impression   Patient received for OT evaluation. See flowsheet below for details of function. Generally, pt is anxious, fidgety, confused and disoriented, not able to focus on the task when OT attempted to engage pt in grooming (pt able to briefly brush hair and wash face; becoming increasingly anxious and unable to brush teeth). Did not mobilize out of bed today 2/2 needing assistance with mobility with PT earlier today and pt now increasingly anxious and confused.  Patient will benefit from continued OT while in acute care.  Would benefit from discussion with POA about pt's prior level of function and goals for care. Pt unable to provide that information today.       Recommendations for follow up therapy are one component of a multi-disciplinary discharge planning process, led by the attending physician.  Recommendations may be updated based on patient status, additional functional criteria and insurance authorization.   Follow Up Recommendations  Skilled nursing-short term rehab (<3 hours/day)     Assistance Recommended at Discharge Frequent or constant Supervision/Assistance  Patient can return home with the following A lot of help with walking and/or transfers;A lot of help with bathing/dressing/bathroom;Assistance with cooking/housework;Assistance with feeding;Direct supervision/assist for medications management;Direct supervision/assist for financial management;Assist for transportation;Help with stairs or ramp for entrance    Functional Status Assessment  Patient  has had a recent decline in their functional status and demonstrates the ability to make significant improvements in function in a reasonable and predictable amount of time.  Equipment Recommendations  Other (comment) (defer to next venue of care)    Recommendations for Other Services       Precautions / Restrictions Precautions Precautions: Fall;Other (comment) (AMS) Restrictions Weight Bearing Restrictions: No      Mobility Bed Mobility               General bed mobility comments: Deferred at this time 2/2 pt's increasing anxious affect; see PT note for details, as PT mobilized with pt earlier today    Transfers                          Balance                                           ADL either performed or assessed with clinical judgement   ADL Overall ADL's : Needs assistance/impaired                                     Functional mobility during ADLs:  (deferred for safety) General ADL Comments: Pt found with mitts on; OT removed them for assessment. OT attempting to engage pt in grooming task to assess UE function and sequencing. Pt declining to comb her hair; OT combed top of patient's hair briefly, then handed comb back to patient. She used LUE to comb top of hair for a few seconds,  then stated she needed to get going. Distractible, anxious, fidgety. Hands appearing functional for the task. Unclear if pt would be able to sequence through a grooming task at this time. Deferred assessment of functional mobility/transfers at this time 2/2 pt's increasing anxiety. Pt able to briefly wash face when provided with warm washcloth. Mitts re-donned at end of session.     Vision         Perception     Praxis      Pertinent Vitals/Pain Pain Assessment Pain Assessment: PAINAD Breathing: normal Negative Vocalization: none Facial Expression: smiling or inexpressive Body Language: tense, distressed pacing,  fidgeting Consolability: no need to console PAINAD Score: 1     Hand Dominance  (unknown)   Extremity/Trunk Assessment Upper Extremity Assessment Upper Extremity Assessment: Difficult to assess due to impaired cognition (OT attempting to engage pt in grooming activity; she appeared to have good ROM, however BIL hands appearing with significant tremors and pt having difficulty focusing on the task.)   Lower Extremity Assessment Lower Extremity Assessment: Defer to PT evaluation   Cervical / Trunk Assessment Cervical / Trunk Assessment: Normal (Pt able to sit up long sitting in bed)   Communication Communication Communication: Other (comment) (Pt with difficulty communicating 2/2 AMS; speaking English; difficulty getting words out.)   Cognition Arousal/Alertness: Awake/alert Behavior During Therapy: Restless, Anxious (stating "I need to get going".) Overall Cognitive Status: Impaired/Different from baseline                                 General Comments: Per medical record, pt's POA states that she is typically A+O and does not have dementia. Today, pt very disoriented, unable to state where she lives, unable to state where she currently is ("is this an apartment?"); stating that she needs to leave. Pt appearing fidgety and anxious. Unable to be calmed with deep breathing or music; OT left classical music playing; trialed Christmas music, but pt not calming or singing along.     General Comments       Exercises     Shoulder Instructions      Home Living Family/patient expects to be discharged to:: Assisted living                             Home Equipment:  (unknown)   Additional Comments: Per record, pt lives at [facility] Unknown if she lives in regular ALF or in memory care.      Prior Functioning/Environment Prior Level of Function : Patient poor historian/Family not available (unknown.)                        OT Problem List:  Decreased cognition;Decreased safety awareness;Decreased knowledge of precautions;Decreased activity tolerance      OT Treatment/Interventions: Self-care/ADL training;Therapeutic exercise;Cognitive remediation/compensation;Patient/family education;Therapeutic activities    OT Goals(Current goals can be found in the care plan section) Acute Rehab OT Goals Patient Stated Goal: Get out of here OT Goal Formulation: Patient unable to participate in goal setting Time For Goal Achievement: 10/01/22 Potential to Achieve Goals: Fair ADL Goals Pt Will Perform Grooming: with modified independence;standing Pt Will Perform Lower Body Dressing: sit to/from stand;with modified independence Pt Will Transfer to Toilet: with modified independence;ambulating Pt Will Perform Toileting - Clothing Manipulation and hygiene: with modified independence;sit to/from stand  OT Frequency: Min 2X/week    Co-evaluation  AM-PAC OT "6 Clicks" Daily Activity     Outcome Measure Help from another person eating meals?: A Little Help from another person taking care of personal grooming?: A Lot Help from another person toileting, which includes using toliet, bedpan, or urinal?: A Lot Help from another person bathing (including washing, rinsing, drying)?: A Lot Help from another person to put on and taking off regular upper body clothing?: A Lot Help from another person to put on and taking off regular lower body clothing?: A Lot 6 Click Score: 13   End of Session    Activity Tolerance: Other (comment) (limited by increasing anxiety) Patient left: in bed;with bed alarm set  OT Visit Diagnosis: Unsteadiness on feet (R26.81)                Time: 7622-6333 OT Time Calculation (min): 16 min Charges:  OT General Charges $OT Visit: 1 Visit OT Evaluation $OT Eval Moderate Complexity: 1 Mod Wah Sabic Carleene Mains, MS, OTR/L   Vania Rea 09/17/2022, 2:51 PM

## 2022-09-17 NOTE — Evaluation (Addendum)
Physical Therapy Evaluation Patient Details Name: Joanna Reed MRN: [id number] DOB: Dec 19, 1940 Today's Date: 09/17/2022  History of Present Illness  Pt is an 81 y/o F admitted on 09/15/22 after presenting to the ED with c/o AMS of unknown etiology. PMH: anxiety, depression, recurrent UTI  Clinical Impression  Pt seen for PT evaluation with pt agreeable. Pt only oriented to self (initially birthday only, but later able to state name after PT recalls it). Per chart, pt is from [facility] but unsure if this is memory care or not. Pt frustrated by having mittens on at beginning & end of session. Pt is able to complete bed mobility with supervision with [facility] elevated & bed rails, stand pivot bed>recliner with mod assist, & ambulate with RW & min assist. Pt demonstrates posterior lean during transfers but is able to correct during gait but by pushing RW slightly out in front of her. Overall mobility limited by urinary incontinence & PT assisted with doffing soiled brief & changing soiled linens. If pt is from memory care, recommend she d/c back there with PT services, otherwise, pt will require SNF. Will continue to follow pt acutely to address balance, endurance, and gait with LRAD.       Recommendations for follow up therapy are one component of a multi-disciplinary discharge planning process, led by the attending physician.  Recommendations may be updated based on patient status, additional functional criteria and insurance authorization.  Follow Up Recommendations Other (comment) (if pt was from memory care she can return there with therapy services, otherwise pt will require SNF)      Assistance Recommended at Discharge Frequent or constant Supervision/Assistance  Patient can return home with the following  A lot of help with walking and/or transfers;A lot of help with bathing/dressing/bathroom;Assist for transportation    Equipment Recommendations None recommended by PT  Recommendations  for Other Services    OT consult   Functional Status Assessment Patient has had a recent decline in their functional status and demonstrates the ability to make significant improvements in function in a reasonable and predictable amount of time.     Precautions / Restrictions Precautions Precautions: Fall Restrictions Weight Bearing Restrictions: No      Mobility  Bed Mobility Overal bed mobility: Needs Assistance Bed Mobility: Supine to Sit     Supine to sit: Supervision, HOB elevated          Transfers Overall transfer level: Needs assistance Equipment used: 1 person hand held assist Transfers: Bed to chair/wheelchair/BSC, Sit to/from Stand Sit to Stand:  (STS from recliner with min assist with poor awareness of hand placement as pt pulls to stand with BUE on RW)   Step pivot transfers: Mod assist (Extra time to weight shift to take steps, reaching for bedrail/armrest for UE support, posterior lean.)            Ambulation/Gait Ambulation/Gait assistance: Min assist Gait Distance (Feet): 10 Feet Assistive device: Rolling walker (2 wheels) Gait Pattern/deviations: Decreased step length - right, Decreased step length - left, Decreased stride length, Decreased dorsiflexion - right, Decreased dorsiflexion - left Gait velocity: decreased        Stairs            Wheelchair Mobility    Modified Rankin (Stroke Patients Only)       Balance Overall balance assessment: Needs assistance Sitting-balance support: Feet supported, Bilateral upper extremity supported Sitting balance-Leahy Scale: Fair Sitting balance - Comments: close supervision static sitting   Standing balance support: Bilateral  upper extremity supported, During functional activity, Reliant on assistive device for balance Standing balance-Leahy Scale: Poor                               Pertinent Vitals/Pain Pain Assessment Pain Assessment: Faces Faces Pain Scale: No hurt     Home Living                     Additional Comments: Per chart, pt is from [facility].    Prior Function Prior Level of Function : Patient poor historian/Family not available                     Hand Dominance        Extremity/Trunk Assessment   Upper Extremity Assessment Upper Extremity Assessment: Overall WFL for tasks assessed    Lower Extremity Assessment Lower Extremity Assessment: Generalized weakness (BLE ankles in plantarflexion but able to perform PROM to neutral & able to stand/ambulate)       Communication      Cognition Arousal/Alertness: Awake/alert Behavior During Therapy: WFL for tasks assessed/performed Overall Cognitive Status: History of cognitive impairments - at baseline                                 General Comments: Per chart, pt with hx of dementia. Able to recall her birthday without assistance but when first asked, pt states her name is "Joanna Reed". Pt able to follow simple commands inconsistently with extra time. Poor safety awareness & overall awareness. Frustrated re: Administrator.        General Comments      Exercises     Assessment/Plan    PT Assessment Patient needs continued PT services  PT Problem List Decreased strength;Decreased coordination;Decreased range of motion;Decreased activity tolerance;Decreased balance;Decreased mobility;Decreased safety awareness       PT Treatment Interventions DME instruction;Therapeutic exercise;Gait training;Balance training;Functional mobility training;Therapeutic activities;Patient/family education;Neuromuscular re-education    PT Goals (Current goals can be found in the Care Plan section)  Acute Rehab PT Goals Patient Stated Goal: get mittens off PT Goal Formulation: With patient Time For Goal Achievement: 10/01/22 Potential to Achieve Goals: Fair    Frequency Min 2X/week     Co-evaluation               AM-PAC PT "6 Clicks" Mobility   Outcome Measure Help needed turning from your back to your side while in a flat bed without using bedrails?: None Help needed moving from lying on your back to sitting on the side of a flat bed without using bedrails?: A Little Help needed moving to and from a bed to a chair (including a wheelchair)?: A Lot Help needed standing up from a chair using your arms (e.g., wheelchair or bedside chair)?: A Little Help needed to walk in hospital room?: A Little Help needed climbing 3-5 steps with a railing? : Total 6 Click Score: 16    End of Session   Activity Tolerance: Patient tolerated treatment well Patient left: in chair;with chair alarm set;with call bell/phone within reach (BUE mittens donned) Nurse Communication: Mobility status PT Visit Diagnosis: Unsteadiness on feet (R26.81);Muscle weakness (generalized) (M62.81);Difficulty in walking, not elsewhere classified (R26.2)    Time: 0160-1093 PT Time Calculation (min) (ACUTE ONLY): 19 min   Charges:   PT Evaluation $PT Eval Moderate Complexity: 1 Mod PT Treatments $  Therapeutic Activity: 8-22 mins        Lavone Nian, PT, DPT 09/17/22, 12:26 PM   Waunita Schooner 09/17/2022, 12:24 PM

## 2022-09-17 NOTE — Plan of Care (Signed)
  Problem: Education: Goal: Knowledge of General Education information will improve Description: Including pain rating scale, medication(s)/side effects and non-pharmacologic comfort measures Outcome: Not Progressing   Problem: Health Behavior/Discharge Planning: Goal: Ability to manage health-related needs will improve Outcome: Not Progressing   Problem: Clinical Measurements: Goal: Ability to maintain clinical measurements within normal limits will improve Outcome: Progressing Goal: Will remain free from infection Outcome: Progressing Goal: Diagnostic test results will improve Outcome: Progressing Goal: Respiratory complications will improve Outcome: Progressing Goal: Cardiovascular complication will be avoided Outcome: Progressing   Problem: Activity: Goal: Risk for activity intolerance will decrease Outcome: Not Progressing   Problem: Nutrition: Goal: Adequate nutrition will be maintained Outcome: Progressing   Problem: Coping: Goal: Level of anxiety will decrease Outcome: Progressing   Problem: Elimination: Goal: Will not experience complications related to bowel motility Outcome: Progressing Goal: Will not experience complications related to urinary retention Outcome: Progressing   Problem: Pain Managment: Goal: General experience of comfort will improve Outcome: Progressing   Problem: Safety: Goal: Ability to remain free from injury will improve Outcome: Progressing   Problem: Skin Integrity: Goal: Risk for impaired skin integrity will decrease Outcome: Progressing

## 2022-09-18 DIAGNOSIS — R410 Disorientation, unspecified: Secondary | ICD-10-CM | POA: Diagnosis not present

## 2022-09-18 LAB — CBC
HCT: 32.4 % — ABNORMAL LOW (ref 36.0–46.0)
Hemoglobin: 10.8 g/dL — ABNORMAL LOW (ref 12.0–15.0)
MCH: 28.8 pg (ref 26.0–34.0)
MCHC: 33.3 g/dL (ref 30.0–36.0)
MCV: 86.4 fL (ref 80.0–100.0)
Platelets: 210 10*3/uL (ref 150–400)
RBC: 3.75 MIL/uL — ABNORMAL LOW (ref 3.87–5.11)
RDW: 12.7 % (ref 11.5–15.5)
WBC: 6 10*3/uL (ref 4.0–10.5)
nRBC: 0 % (ref 0.0–0.2)

## 2022-09-18 LAB — BASIC METABOLIC PANEL
Anion gap: 8 (ref 5–15)
BUN: 12 mg/dL (ref 8–23)
CO2: 26 mmol/L (ref 22–32)
Calcium: 9.5 mg/dL (ref 8.9–10.3)
Chloride: 111 mmol/L (ref 98–111)
Creatinine, Ser: 0.64 mg/dL (ref 0.44–1.00)
GFR, Estimated: >60 mL/min (ref >60)
Glucose, Bld: 95 mg/dL (ref 70–99)
Potassium: 4.8 mmol/L (ref 3.5–5.1)
Sodium: 145 mmol/L (ref 135–145)

## 2022-09-18 LAB — MAGNESIUM: Magnesium: 2.1 mg/dL (ref 1.7–2.4)

## 2022-09-18 MED ORDER — SODIUM CHLORIDE 0.9 % IV SOLN: INTRAVENOUS | Status: DC

## 2022-09-18 NOTE — Plan of Care (Signed)
  Problem: Education: Goal: Knowledge of General Education information will improve Description: Including pain rating scale, medication(s)/side effects and non-pharmacologic comfort measures 09/18/2022 0130 by Delton See, RN Outcome: Not Progressing 09/18/2022 0130 by Delton See, RN Outcome: Progressing   Problem: Health Behavior/Discharge Planning: Goal: Ability to manage health-related needs will improve 09/18/2022 0130 by Delton See, RN Outcome: Not Progressing 09/18/2022 0130 by Delton See, RN Outcome: Progressing   Problem: Clinical Measurements: Goal: Ability to maintain clinical measurements within normal limits will improve Outcome: Progressing Goal: Will remain free from infection Outcome: Progressing Goal: Diagnostic test results will improve Outcome: Progressing Goal: Respiratory complications will improve Outcome: Progressing Goal: Cardiovascular complication will be avoided Outcome: Progressing   Problem: Activity: Goal: Risk for activity intolerance will decrease Outcome: Progressing   Problem: Nutrition: Goal: Adequate nutrition will be maintained Outcome: Progressing   Problem: Coping: Goal: Level of anxiety will decrease Outcome: Progressing   Problem: Elimination: Goal: Will not experience complications related to bowel motility Outcome: Progressing Goal: Will not experience complications related to urinary retention Outcome: Progressing   Problem: Pain Managment: Goal: General experience of comfort will improve Outcome: Progressing   Problem: Safety: Goal: Ability to remain free from injury will improve Outcome: Progressing   Problem: Skin Integrity: Goal: Risk for impaired skin integrity will decrease Outcome: Progressing

## 2022-09-18 NOTE — Progress Notes (Signed)
Occupational Therapy Treatment Patient Details Name: Joanna Reed MRN: [id number] DOB: [birth year] Today's Date: 09/18/2022   History of present illness Pt is an 81 y/o F admitted on 09/15/22 after presenting to the ED with c/o AMS of unknown etiology. PMH: anxiety, depression, recurrent UTI. Pt lives at [place]. Per medical record, pt's friend is POA and states that pt does not have any dementia at baseline.   OT comments  [name] was seen for OT treatment on this date. Upon arrival to room pt reclined in bed, responds to name but unable to answer orientation questions. Pt requires MIN A + RW for toilet t/f, when focused on task no physical assist required however frequently requires assist for RW advancement and cues to redirect. Pt states "I dont know what to do with this" when holding toilet paper, ultimately MAX A pericare for thoroughness. MOD A don/doff B shoes seated EOB. SETUP self-feeding at bed level, mitts donned at end of session. Pt making good progress toward goals, will continue to follow POC. Pt greatly limited by cognition, would require supervision for all mobility to return to facility safely.    Recommendations for follow up therapy are one component of a multi-disciplinary discharge planning process, led by the attending physician.  Recommendations may be updated based on patient status, additional functional criteria and insurance authorization.    Follow Up Recommendations  Skilled nursing-short term rehab (<3 hours/day) (may return to ALF with 24/7 SUPERVISION)     Assistance Recommended at Discharge Frequent or constant Supervision/Assistance  Patient can return home with the following  A lot of help with walking and/or transfers;A lot of help with bathing/dressing/bathroom;Assistance with cooking/housework;Assistance with feeding;Direct supervision/assist for medications management;Direct supervision/assist for financial management;Assist for transportation;Help with  stairs or ramp for entrance   Equipment Recommendations  Other (comment) (defer)    Recommendations for Other Services      Precautions / Restrictions Precautions Precautions: Fall Restrictions Weight Bearing Restrictions: No       Mobility Bed Mobility Overal bed mobility: Needs Assistance Bed Mobility: Supine to Sit, Sit to Supine     Supine to sit: Mod assist Sit to supine: Mod assist   General bed mobility comments: anticipate pt could physically complete, assist for cognition    Transfers Overall transfer level: Needs assistance Equipment used: Rolling walker (2 wheels) Transfers: Sit to/from Stand Sit to Stand: Min assist                 Balance Overall balance assessment: Needs assistance Sitting-balance support: Feet supported, No upper extremity supported Sitting balance-Leahy Scale: Good     Standing balance support: Single extremity supported, During functional activity Standing balance-Leahy Scale: Fair Standing balance comment: initial posterior lean                           ADL either performed or assessed with clinical judgement   ADL Overall ADL's : Needs assistance/impaired                                       General ADL Comments: MIN A + RW for toilet t/f, when focused on task no physical assist required however frequently requires assist for RW advancement and cues to redirect. MOD A don/doff B shoes seated EOB. SETUP self-feeding at bed level      Cognition Arousal/Alertness: Awake/alert Behavior During Therapy: Restless,  Flat affect Overall Cognitive Status: Impaired/Different from baseline Area of Impairment: Orientation, Memory, Following commands, Safety/judgement                 Orientation Level: Disoriented to, Place, Time, Situation   Memory: Decreased recall of precautions, Decreased short-term memory Following Commands: Follows one step commands consistently, Follows one step  commands with increased time Safety/Judgement: Decreased awareness of deficits, Decreased awareness of safety     General Comments: pt states " i dont know what to do with this" when holding toilet paper or standing at toilet.                   Pertinent Vitals/ Pain       Pain Assessment Pain Assessment: No/denies pain   Frequency  Min 2X/week        Progress Toward Goals  OT Goals(current goals can now be found in the care plan section)  Progress towards OT goals: Progressing toward goals  Acute Rehab OT Goals Patient Stated Goal: to go home OT Goal Formulation: Patient unable to participate in goal setting Time For Goal Achievement: 10/01/22 Potential to Achieve Goals: Fair ADL Goals Pt Will Perform Grooming: with modified independence;standing Pt Will Perform Lower Body Dressing: sit to/from stand;with modified independence Pt Will Transfer to Toilet: with modified independence;ambulating Pt Will Perform Toileting - Clothing Manipulation and hygiene: with modified independence;sit to/from stand  Plan Discharge plan remains appropriate;Frequency remains appropriate    Co-evaluation                 AM-PAC OT "6 Clicks" Daily Activity     Outcome Measure   Help from another person eating meals?: A Little Help from another person taking care of personal grooming?: A Little Help from another person toileting, which includes using toliet, bedpan, or urinal?: A Little Help from another person bathing (including washing, rinsing, drying)?: A Little Help from another person to put on and taking off regular upper body clothing?: A Little Help from another person to put on and taking off regular lower body clothing?: A Lot 6 Click Score: 17    End of Session Equipment Utilized During Treatment: Rolling walker (2 wheels)  OT Visit Diagnosis: Unsteadiness on feet (R26.81)   Activity Tolerance Patient tolerated treatment well   Patient Left in bed;with call  bell/phone within reach;with bed alarm set   Nurse Communication Mobility status        Time: 1007-1219 OT Time Calculation (min): 39 min  Charges: OT General Charges $OT Visit: 1 Visit OT Treatments $Self Care/Home Management : 38-52 mins  Dessie Coma, M.S. OTR/L  09/18/22, 12:46 PM  ascom [phone]

## 2022-09-18 NOTE — TOC Progression Note (Addendum)
Transition of Care [facility]) - Progression Note    Patient Details  Name: Joanna Reed MRN: [id number] Date of Birth: 28-Aug-1941  Transition of Care [facility]) CM/SW [place], LCSW Phone Number: 09/18/2022, 10:19 AM  Clinical Narrative:  Left voicemail for Ed Weeks at [facility] to discuss possibility of return with increased services.   10:55 am: Received call from [place] at [facility]. She was asking for information on why patient required SNF placement. Told her I would call back once in front of a computer to review therapy notes. Called her back and left a voicemail offering to fax therapy notes for her to review.  11:03 am: Received call back from [place]. Faxing therapy notes for her to review. She will also ask Ed to come assess patient in person today.  2:58 pm: Received call from [place]. They are going to move patient to their second floor which is a secured unit. This is where they move patients that need memory care until a bed opens up on that unit. [facility] will provide home health PT and OT. Asked MD for home health order. Vaughan Basta confirmed daughter is aware of plan.  3:19 pm: Plan for discharge tomorrow. Vaughan Basta and friend Hoyle Sauer are aware. Will set up EMS transport. Hoyle Sauer is aware patient may be billed.  Expected Discharge Plan: [place] Barriers to Discharge: Continued Medical Work up  Expected Discharge Plan and Services Expected Discharge Plan: [place] arrangements for the past 2 months: Assisted Living Facility                                       Social Determinants of Health (SDOH) Interventions    Readmission Risk Interventions     No data to display

## 2022-09-18 NOTE — Progress Notes (Signed)
PROGRESS NOTE  Joanna Reed  SWN:[id number] DOB: [birth year] DOA: 09/15/2022 PCP: Pleas Koch, NP   Brief Narrative:  Patient is a 81 year old female with history of anxiety/depression, recurrent UTI, hypertension, dementia who presented from [place] facility for concern of altered mental status secondary to UTI.  On presentation she was hemodynamically stable, afebrile without any leukocytosis.  UA showed trace leukocytes.  She was started on ceftriaxone.  Hospital course remarkable for agitation,confusion.  Plan is to move her back to previous nursing facility after further improvement in mentation.  TOC following  Assessment & Plan:  Principal Problem:   AMS (altered mental status) Active Problems:   UTI (urinary tract infection)   Fall at home, initial encounter   Anemia  Altered mental status: Unclear etiology.  CT head  negative for acute findings.  UA showed trace leukocytes.  Patient is afebrile without any leukocytes.  She was started on ceftriaxone.  Urine cultures showed insignificant growth.  She completed 3 days course of ceftriaxone so it has been stopped. She is also on low-dose Seroquel.  Continue delirium precautions. She was still on mittens today which has been removed.  We will continue gentle IV fluids today because she is not eating that well  Hypertension: Currently blood pressure stable.  Continue amlodipine  Normocytic anemia: Currently hemoglobin stable  Anxiety with depression/dementia: On Zoloft, lorazepam, Aricept at home.She is confused at baseline  Hypernatremia: Resolved  Hypokalemia: Supplemented and corrected.  Debility/deconditioning: Patient lives in a nursing facility.  Plan is to send her back likely tomorow.TOC following         DVT prophylaxis:heparin injection 5,000 Units Start: 09/16/22 0030     Code Status: Full Code  Family Communication: Trula Slade at bedside  Patient status:Inpatient  Patient is  from :SNF  Anticipated discharge to:SNF  Estimated DC date:tomorrow   Consultants: None  Procedures:None  Antimicrobials:  Anti-infectives (From admission, onward)    Start     Dose/Rate Route Frequency Ordered Stop   09/16/22 1900  cefTRIAXone (ROCEPHIN) 1 g in sodium chloride 0.9 % 100 mL IVPB  Status:  Discontinued        1 g 200 mL/hr over 30 Minutes Intravenous Every 24 hours 09/16/22 0028 09/18/22 1317   09/15/22 1900  cefTRIAXone (ROCEPHIN) 1 g in sodium chloride 0.9 % 100 mL IVPB        1 g 200 mL/hr over 30 Minutes Intravenous  Once 09/15/22 1854 09/15/22 2141       Subjective: Patient seen and examined at the bedside this afternoon.  Hemodynamically stable.  Lying on bed.  Confused.  Alert and awake but not oriented at all.  Not agitated. Mittens were still on, which were discontinued.  We decided to leave the IV line today to continue gentle IV fluids till tomorrow.  Long discussion held with the caregiver/friend Hoyle Sauer at bedside.  Objective: Vitals:   09/17/22 0906 09/17/22 2041 09/18/22 0405 09/18/22 0833  BP: (!) 145/71 (!) 124/92 135/72 134/62  Pulse: 79 75 82 77  Resp: '18 18 20 18  '$ Temp: 98 F (36.7 C) 98.7 F (37.1 C) 98.9 F (37.2 C) 97.9 F (36.6 C)  TempSrc:   Oral   SpO2: 96% 95% 100% 95%  Weight:      Height:        Intake/Output Summary (Last 24 hours) at 09/18/2022 1426 Last data filed at 09/18/2022 0500 Gross per 24 hour  Intake 1400.84 ml  Output 600 ml  Net  800.84 ml   Filed Weights   09/15/22 1619 09/17/22 0500  Weight: 46.3 kg 48.4 kg    Examination:  General exam: Overall comfortable, not in distress, pleasantly confused elderly female HEENT: PERRL Respiratory system:  no wheezes or crackles  Cardiovascular system: S1 & S2 heard, RRR.  Gastrointestinal system: Abdomen is nondistended, soft and nontender. Central nervous system: Alert and awake but not oriented.Follows commands Extremities: No edema, no clubbing ,no  cyanosis Skin: No rashes, no ulcers,no icterus     Data Reviewed: I have personally reviewed following labs and imaging studies  CBC: Recent Labs  Lab 09/15/22 1625 09/16/22 0530 09/17/22 0455 09/18/22 0505  WBC 9.2 7.4 6.4 6.0  NEUTROABS 6.2  --   --   --   HGB 11.5* 11.1* 10.5* 10.8*  HCT 34.7* 33.7* 31.6* 32.4*  MCV 87.0 86.0 85.9 86.4  PLT 241 205 214 973   Basic Metabolic Panel: Recent Labs  Lab 09/15/22 1625 09/16/22 0530 09/17/22 0455 09/18/22 0505  NA 141 146* 144 145  K 3.6 3.4* 3.3* 4.8  CL 107 115* 112* 111  CO2 '27 27 28 26  '$ GLUCOSE 99 108* 89 95  BUN 24* '13 16 12  '$ CREATININE 0.71 0.54 0.68 0.64  CALCIUM 9.5 8.9 8.9 9.5  MG  --  2.1 2.0 2.1     Recent Results (from the past 240 hour(s))  Urine Culture     Status: Abnormal   Collection Time: 09/15/22  5:01 PM   Specimen: Urine, Clean Catch  Result Value Ref Range Status   Specimen Description   Final    URINE, CLEAN CATCH Performed at [facility], [address] [place], [place] [address]    Special Requests   Final    NONE Performed at [facility], [address] [place], [place] [address]    Culture (A)  Final    <10,000 COLONIES/mL INSIGNIFICANT GROWTH Performed at [facility], [place] [address] [place], [place] [address]    Report Status 09/17/2022 FINAL  Final     Radiology Studies: No results found.  Scheduled Meds:  amLODipine  5 mg Oral Daily   donepezil  10 mg Oral Daily   folic acid  1 mg Oral Daily   heparin  5,000 Units Subcutaneous Q8H   QUEtiapine  25 mg Oral QHS   sertraline  200 mg Oral QHS   sodium chloride flush  3 mL Intravenous Q12H   Continuous Infusions:  lactated ringers 75 mL/hr at 09/18/22 1418     LOS: 2 days   Shelly Coss, MD [facility] P11/27/2023, 2:26 PM

## 2022-09-19 DIAGNOSIS — R4182 Altered mental status, unspecified: Secondary | ICD-10-CM | POA: Diagnosis not present

## 2022-09-19 MED ORDER — QUETIAPINE FUMARATE 25 MG PO TABS: 25.0000 mg | ORAL_TABLET | Freq: Every day | ORAL | Status: DC

## 2022-09-19 MED ORDER — LORAZEPAM 0.5 MG PO TABS: 0.2500 mg | ORAL_TABLET | Freq: Every day | ORAL | 0 refills | Status: DC

## 2022-09-19 MED ORDER — LORAZEPAM 0.5 MG PO TABS: 0.2500 mg | ORAL_TABLET | Freq: Two times a day (BID) | ORAL | 0 refills | Status: DC | PRN

## 2022-09-19 NOTE — TOC Progression Note (Addendum)
Transition of Care [facility]) - Progression Note    Patient Details  Name: Joanna Reed MRN: [id number] Date of Birth: [birth year]  Transition of Care [facility]) CM/SW [place], LCSW Phone Number: 09/19/2022, 11:33 AM  Clinical Narrative:  Joanna Reed and discharge summary to [facility] for review.   12:48 pm: Faxed signed FL2 and home health orders to [place].  Expected Discharge Plan: [place] Barriers to Discharge: Continued Medical Work up  Expected Discharge Plan and Services Expected Discharge Plan: [place] arrangements for the past 2 months: [place] Expected Discharge Date: 09/19/22                                     Social Determinants of Health (SDOH) Interventions    Readmission Risk Interventions     No data to display

## 2022-09-19 NOTE — Care Management Important Message (Signed)
Important Message  Patient Details  Name: Joanna Reed MRN: [id number] Date of Birth: 01/23/1941   Medicare Important Message Given:  Yes     Dannette Barbara 09/19/2022, 11:08 AM

## 2022-09-19 NOTE — Plan of Care (Signed)

## 2022-09-19 NOTE — Discharge Summary (Addendum)
Physician Discharge Summary  Joanna Reed NOI:[id number] DOB: 02/23/1941 DOA: 09/15/2022  PCP: Pleas Koch, NP  Admit date: 09/15/2022 Discharge date: 09/19/2022  Admitted From: ALF Disposition:  ALF  Discharge Condition:Stable CODE STATUS:FULL Diet recommendation: Regular  Brief/Interim Summary: Patient is a 81 year old female with history of anxiety/depression, recurrent UTI, hypertension, dementia who presented from [place] facility for concern of altered mental status secondary to UTI.  On presentation she was hemodynamically stable, afebrile without any leukocytosis.  UA showed trace leukocytes.  She was started on ceftriaxone.  Hospital course remarkable for agitation,confusion.  Her urine culture showed insignificant growth.  She completed 3 days course of antibiotics, she has been started on low-dose Seroquel.  This morning she is not agitated, remains confused at her baseline.  Medically stable for discharge back to ALF today  Following problems were addressed during her hospitalization:  Altered mental status: Unclear etiology.  CT head  negative for acute findings.  UA showed trace leukocytes.  Patient is afebrile without any leukocytes.  She was started on ceftriaxone.  Urine cultures showed insignificant growth.  She completed 3 days course of ceftriaxone so it has been stopped. She has ben started  also on low-dose Seroquel.     Hypertension: Currently blood pressure stable.  Continue amlodipine   Normocytic anemia: Currently hemoglobin stable   Anxiety with depression/dementia: On Zoloft, lorazepam, Aricept at home.She is confused at baseline   Hypernatremia: Resolved   Hypokalemia: Supplemented and corrected.   Debility/deconditioning: Patient lives in a nursing facility.  Plan is to send her back.TOC following   Discharge Diagnoses:  Principal Problem:   AMS (altered mental status) Active Problems:   UTI (urinary tract infection)   Fall  at home, initial encounter   Anemia    Discharge Instructions  Discharge Instructions     Diet - low sodium heart healthy   Complete by: As directed    Diet general   Complete by: As directed    Discharge instructions   Complete by: As directed    1)Please take prescribed medication as instructe 2)Follow up with your PCP in a week   Increase activity slowly   Complete by: As directed       Allergies as of 09/19/2022       Reactions   Sulfa Antibiotics         Medication List     TAKE these medications    amLODipine 5 MG tablet Commonly known as: NORVASC Take 5 mg by mouth daily.   Co Q-10 400 MG Caps Take 400 mg by mouth daily.   diclofenac Sodium 1 % Gel Commonly known as: VOLTAREN Apply 2 g topically 2 (two) times daily. Apply 2 grams topically to left knee twice daily.   docusate sodium 100 MG capsule Commonly known as: COLACE Take 100 mg by mouth daily.   donepezil 10 MG tablet Commonly known as: ARICEPT Take 10 mg by mouth daily.   folic acid 1 MG tablet Commonly known as: FOLVITE Take 1 mg by mouth daily.   LORazepam 0.5 MG tablet Commonly known as: ATIVAN Take 0.5 tablets (0.25 mg total) by mouth every 12 (twelve) hours as needed for anxiety. What changed: Another medication with the same name was removed. Continue taking this medication, and follow the directions you see here.   multivitamin with minerals Tabs tablet Take 1 tablet by mouth daily.   Prevagen 10 MG Caps Generic drug: Apoaequorin Take 10 mg by mouth daily.  QUEtiapine 25 MG tablet Commonly known as: SEROQUEL Take 1 tablet (25 mg total) by mouth at bedtime.   sertraline 100 MG tablet Commonly known as: ZOLOFT Take 200 mg by mouth daily.        Allergies  Allergen Reactions   Sulfa Antibiotics     Consultations: None   Procedures/Studies: CT Head Wo Contrast  Result Date: 09/15/2022 CLINICAL DATA:  Mental status changes. EXAM: CT HEAD WITHOUT CONTRAST  TECHNIQUE: Contiguous axial images were obtained from the base of the skull through the vertex without intravenous contrast. RADIATION DOSE REDUCTION: This exam was performed according to the departmental dose-optimization program which includes automated exposure control, adjustment of the mA and/or kV according to patient size and/or use of iterative reconstruction technique. COMPARISON:  12/29/2020 FINDINGS: Brain: No evidence of acute infarction, hemorrhage, hydrocephalus, extra-axial collection or mass lesion/mass effect. Diffuse loss of parenchymal volume is consistent with atrophy. Patchy low attenuation in the deep hemispheric and periventricular white matter is nonspecific, but likely reflects chronic microvascular ischemic demyelination. Vascular: No hyperdense vessel or unexpected calcification. Skull: No evidence for fracture. No worrisome lytic or sclerotic lesion. 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 Visualized portions of the globes and intraorbital fat are unremarkable. Other: None. IMPRESSION: 1. No acute intracranial abnormality. 2. Atrophy with chronic small vessel ischemic disease. 3. 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 Electronically Signed   By: Misty Stanley M.D.   On: 09/15/2022 16:59      Subjective: Patient seen and examined at bedside today.  Hemodynamically stable for discharge.  Discharge Exam: Vitals:   09/19/22 0800 09/19/22 0817  BP:  137/72  Pulse:  70  Resp:  16  Temp: 98 F (36.7 C) 98 F (36.7 C)  SpO2:  96%   Vitals:   09/18/22 2057 09/19/22 0334 09/19/22 0800 09/19/22 0817  BP: 133/75 (!) 146/66  137/72  Pulse: 72 62  70  Resp: '19 20  16  '$ Temp: 97.9 F (36.6 C) 97.7 F (36.5 C) 98 F (36.7 C) 98 F (36.7 C)  TempSrc:  Oral Oral   SpO2: 96% 97%  96%  Weight:      Height:        General: Pt is alert, awake, not in acute distress Cardiovascular: RRR, S1/S2 +, no rubs, no gallops Respiratory: CTA bilaterally, no  wheezing, no rhonchi Abdominal: Soft, NT, ND, bowel sounds + Extremities: no edema, no cyanosis    The results of significant diagnostics from this hospitalization (including imaging, microbiology, ancillary and laboratory) are listed below for reference.     Microbiology: Recent Results (from the past 240 hour(s))  Urine Culture     Status: Abnormal   Collection Time: 09/15/22  5:01 PM   Specimen: Urine, Clean Catch  Result Value Ref Range Status   Specimen Description   Final    URINE, CLEAN CATCH Performed at [facility], [address] [place], [place] [address]    Special Requests   Final    NONE Performed at [facility], [address], [place], [place] [address]    Culture (A)  Final    <10,000 COLONIES/mL INSIGNIFICANT GROWTH Performed at [facility] [address] [place], [place] [address]    Report Status 09/17/2022 FINAL  Final     Labs: BNP (last 3 results) Recent Labs    09/15/22 2310  BNP 31.5   Basic Metabolic Panel: Recent Labs  Lab 09/15/22 1625 09/16/22 0530 09/17/22 0455 09/18/22  0505  NA 141 146* 144 145  K 3.6 3.4* 3.3* 4.8  CL 107 115* 112* 111  CO2 '27 27 28 26  '$ GLUCOSE 99 108* 89 95  BUN 24* '13 16 12  '$ CREATININE 0.71 0.54 0.68 0.64  CALCIUM 9.5 8.9 8.9 9.5  MG  --  2.1 2.0 2.1   Liver Function Tests: Recent Labs  Lab 09/15/22 1625 09/16/22 0530  AST 17 19  ALT 12 11  ALKPHOS 67 55  BILITOT 0.5 0.4  PROT 7.4 6.5  ALBUMIN 4.2 3.6   No results for input(s): "LIPASE", "AMYLASE" in the last 168 hours. Recent Labs  Lab 09/17/22 1401  AMMONIA <10   CBC: Recent Labs  Lab 09/15/22 1625 09/16/22 0530 09/17/22 0455 09/18/22 0505  WBC 9.2 7.4 6.4 6.0  NEUTROABS 6.2  --   --   --   HGB 11.5* 11.1* 10.5* 10.8*  HCT 34.7* 33.7* 31.6* 32.4*  MCV 87.0 86.0 85.9 86.4  PLT 241 205 214 210   Cardiac Enzymes: Recent Labs  Lab 09/15/22 2310  CKTOTAL 78   BNP: Invalid input(s):  "POCBNP" CBG: No results for input(s): "GLUCAP" in the last 168 hours. D-Dimer No results for input(s): "DDIMER" in the last 72 hours. Hgb A1c No results for input(s): "HGBA1C" in the last 72 hours. Lipid Profile No results for input(s): "CHOL", "HDL", "LDLCALC", "TRIG", "CHOLHDL", "LDLDIRECT" in the last 72 hours. Thyroid function studies Recent Labs    09/17/22 1401  TSH 1.228   Anemia work up Recent Labs    09/17/22 0455  VITAMINB12 472   Urinalysis    Component Value Date/Time   COLORURINE COLORLESS (A) 09/15/2022 2130   APPEARANCEUR CLEAR (A) 09/15/2022 2130   LABSPEC 1.004 (L) 09/15/2022 2130   PHURINE 7.0 09/15/2022 2130   GLUCOSEU NEGATIVE 09/15/2022 2130   HGBUR NEGATIVE 09/15/2022 2130   BILIRUBINUR NEGATIVE 09/15/2022 2130   KETONESUR NEGATIVE 09/15/2022 2130   PROTEINUR NEGATIVE 09/15/2022 2130   NITRITE NEGATIVE 09/15/2022 2130   LEUKOCYTESUR TRACE (A) 09/15/2022 2130   Sepsis Labs Recent Labs  Lab 09/15/22 1625 09/16/22 0530 09/17/22 0455 09/18/22 0505  WBC 9.2 7.4 6.4 6.0   Microbiology Recent Results (from the past 240 hour(s))  Urine Culture     Status: Abnormal   Collection Time: 09/15/22  5:01 PM   Specimen: Urine, Clean Catch  Result Value Ref Range Status   Specimen Description   Final    URINE, CLEAN CATCH Performed at [facility], [address] [place], [place] [address]    Special Requests   Final    NONE Performed at [facility], [address], [place], [place] [address]    Culture (A)  Final    <10,000 COLONIES/mL INSIGNIFICANT GROWTH Performed at [facility], [place] [address] [place], [place] [address]    Report Status 09/17/2022 FINAL  Final    Please note: You were cared for by a hospitalist during your hospital stay. Once you are discharged, your primary care physician will handle any further medical issues. Please note that NO REFILLS for any discharge medications will be authorized once  you are discharged, as it is imperative that you return to your primary care physician (or establish a relationship with a primary care physician if you do not have one) for your post hospital discharge needs so that they can reassess your need for medications and monitor your lab values.    Time coordinating discharge: 40 minutes  SIGNED:   Shelly Coss, MD  [facility] 09/19/2022, 11:09 AM Pager [phone]  If 7PM-7AM, please contact night-coverage [email or web address] Password [email or web address]

## 2022-09-19 NOTE — NC FL2 (Signed)
[place] LEVEL OF CARE SCREENING TOOL     IDENTIFICATION  Patient Name: Joanna Reed Birthdate: [birth year] Sex: female Admission Date (Current Location): 09/15/2022  [facility] and [place] Number:  Engineering geologist and Address:  [facility], [address] [place], [place] [address]      Provider Number: [id number]  Attending Physician Name and Address:  Shelly Coss, MD  Relative Name and Phone Number:       Current Level of Care: Hospital Recommended Level of Care: [place] (with PT and OT through [facility]) Prior Approval Number:    Date Approved/Denied:   PASRR Number:    Discharge Plan: Other (Comment) (ALF with PT and OT through [facility])    Current Diagnoses: Patient Active Problem List   Diagnosis Date Noted   AMS (altered mental status) 09/15/2022   Anemia 09/15/2022   Generalized anxiety disorder 12/31/2020   Dysthymia 12/31/2020   Cognitive impairment 12/31/2020   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   UTI (urinary tract infection) 12/29/2020   Fall at home, initial encounter 86/57/8469   Acute metabolic encephalopathy 62/95/2841   Pressure injury of skin 12/29/2020   Preventative health care 03/19/2018   Chronic knee pain 02/27/2017   Medicare annual wellness visit, subsequent 01/28/2015   Anxiety and depression 01/19/2015   Acute shoulder pain 01/19/2015    Orientation RESPIRATION BLADDER Height & Weight     Self  Normal Incontinent Weight: 106 lb 11.2 oz (48.4 kg) Height:  '5\' 2"'$  (157.5 cm)  BEHAVIORAL SYMPTOMS/MOOD NEUROLOGICAL BOWEL NUTRITION STATUS     (None) Continent Diet (Regular)  AMBULATORY STATUS COMMUNICATION OF NEEDS Skin   Limited Assist Verbally Normal                       Personal Care Assistance Level of Assistance  Bathing, Feeding, Dressing Bathing Assistance: Limited assistance Feeding assistance: Limited assistance Dressing  Assistance: Limited assistance     Functional Limitations Info  Sight, Hearing, Speech Sight Info: Adequate Hearing Info: Adequate Speech Info: Adequate    SPECIAL CARE FACTORS FREQUENCY  PT (By licensed PT), OT (By licensed OT)     PT Frequency: 3 x week OT Frequency: 3 x week            Contractures Contractures Info: Not present    Additional Factors Info  Code Status, Allergies Code Status Info: Full code Allergies Info: Sulfa Antibiotics           Current Medications (09/19/2022):  This is the current hospital active medication list Current Facility-Administered Medications  Medication Dose Route Frequency Provider Last Rate Last Admin   acetaminophen (TYLENOL) tablet 650 mg  650 mg Oral Q6H PRN Para Skeans, MD   650 mg at 09/18/22 2152   Or   acetaminophen (TYLENOL) suppository 650 mg  650 mg Rectal Q6H PRN Para Skeans, MD       amLODipine (NORVASC) tablet 5 mg  5 mg Oral Daily Pahwani, Rinka R, MD   5 mg at 09/19/22 0825   donepezil (ARICEPT) tablet 10 mg  10 mg Oral Daily Pahwani, Rinka R, MD   10 mg at 32/44/01 0272   folic acid (FOLVITE) tablet 1 mg  1 mg Oral Daily Pahwani, Rinka R, MD   1 mg at 09/19/22 0825   haloperidol (HALDOL) tablet 2 mg  2 mg Oral Q6H PRN Para Skeans, MD   2 mg at 09/16/22 1826  haloperidol lactate (HALDOL) injection 5 mg  5 mg Intravenous Q6H PRN Pahwani, Rinka R, MD   5 mg at 09/17/22 1431   heparin injection 5,000 Units  5,000 Units Subcutaneous Q8H Para Skeans, MD   5,000 Units at 09/19/22 1601   QUEtiapine (SEROQUEL) tablet 25 mg  25 mg Oral QHS Pahwani, Rinka R, MD   25 mg at 09/18/22 2152   sertraline (ZOLOFT) tablet 200 mg  200 mg Oral QHS Dallie Piles, RPH   200 mg at 09/18/22 2152   sodium chloride flush (NS) 0.9 % injection 3 mL  3 mL Intravenous Q12H Para Skeans, MD   3 mL at 09/19/22 0825     Discharge Medications: TAKE these medications     amLODipine 5 MG tablet Commonly known as: NORVASC Take 5 mg  by mouth daily.    Co Q-10 400 MG Caps Take 400 mg by mouth daily.    diclofenac Sodium 1 % Gel Commonly known as: VOLTAREN Apply 2 g topically 2 (two) times daily. Apply 2 grams topically to left knee twice daily.    docusate sodium 100 MG capsule Commonly known as: COLACE Take 100 mg by mouth daily.    donepezil 10 MG tablet Commonly known as: ARICEPT Take 10 mg by mouth daily.    folic acid 1 MG tablet Commonly known as: FOLVITE Take 1 mg by mouth daily.    LORazepam 0.5 MG tablet Commonly known as: ATIVAN Take 0.5 tablets (0.25 mg total) by mouth every 12 (twelve) hours as needed for anxiety. What changed: Another medication with the same name was removed. Continue taking this medication, and follow the directions you see here.    multivitamin with minerals Tabs tablet Take 1 tablet by mouth daily.    Prevagen 10 MG Caps Generic drug: Apoaequorin Take 10 mg by mouth daily.    QUEtiapine 25 MG tablet Commonly known as: SEROQUEL Take 1 tablet (25 mg total) by mouth at bedtime.    sertraline 100 MG tablet Commonly known as: ZOLOFT Take 200 mg by mouth daily.    Relevant Imaging Results:  Relevant Lab Results:   Additional Information SS#: [id number]  Candie Chroman, LCSW

## 2022-09-19 NOTE — Progress Notes (Signed)
Patient transport EMS picked up patient to return back the SNF.

## 2022-09-19 NOTE — TOC Transition Note (Signed)
Transition of Care [facility]) - CM/SW Discharge Note   Patient Details  Name: Joanna Reed MRN: [id number] Date of Birth: 03/30/1941  Transition of Care [facility]) CM/SW Contact:  Candie Chroman, LCSW Phone Number: 09/19/2022, 1:23 PM   Clinical Narrative: Patient has orders to discharge back to [facility]F today. Facility said RN does not need to call report. EMS transport has been arranged and there are two in front of her. No further concerns. CSW signing off.      Final next level of care: Assisted Living Barriers to Discharge: Barriers Resolved   Patient Goals and CMS Choice Patient states their goals for this hospitalization and ongoing recovery are:: Per Friend, would like patient to discharge back to [facility] for ALF CMS Medicare.gov Compare Post Acute Care list provided to:: Patient Represenative (must comment) Hoyle Sauer) Choice offered to / list presented to : [facility] POA / Guardian  Discharge Placement                Patient to be transferred to facility by: EMS Name of family member notified: [name] Patient and family notified of of transfer: 09/19/22  Discharge Plan and Services                          HH Arranged: PT, OT Tower Agency: Other - See comment Babs Bertin - in-house therapy agency at [facility] [employer])        Social Determinants of Health ([place]) Interventions     Readmission Risk Interventions     No data to display

## 2022-10-14 ENCOUNTER — Other Ambulatory Visit: Payer: Self-pay

## 2022-10-14 ENCOUNTER — Emergency Department
Admission: EM | Admit: 2022-10-14 | Discharge: 2022-10-14 | Disposition: A | Discharge status: Skilled Nursing Facility | Payer: Medicare PPO | Attending: Emergency Medicine | Admitting: Emergency Medicine

## 2022-10-14 ENCOUNTER — Emergency Department: Payer: Medicare PPO

## 2022-10-14 DIAGNOSIS — F0390 Unspecified dementia without behavioral disturbance: Secondary | ICD-10-CM | POA: Diagnosis not present

## 2022-10-14 DIAGNOSIS — K5900 Constipation, unspecified: Secondary | ICD-10-CM | POA: Insufficient documentation

## 2022-10-14 DIAGNOSIS — R410 Disorientation, unspecified: Secondary | ICD-10-CM | POA: Insufficient documentation

## 2022-10-14 DIAGNOSIS — Z1152 Encounter for screening for COVID-19: Secondary | ICD-10-CM | POA: Insufficient documentation

## 2022-10-14 DIAGNOSIS — R509 Fever, unspecified: Secondary | ICD-10-CM | POA: Insufficient documentation

## 2022-10-14 LAB — CBC WITH DIFFERENTIAL/PLATELET
Abs Immature Granulocytes: 0.01 10*3/uL (ref 0.00–0.07)
Basophils Absolute: 0 10*3/uL (ref 0.0–0.1)
Basophils Relative: 0 %
Eosinophils Absolute: 0 10*3/uL (ref 0.0–0.5)
Eosinophils Relative: 1 %
HCT: 31.9 % — ABNORMAL LOW (ref 36.0–46.0)
Hemoglobin: 10.5 g/dL — ABNORMAL LOW (ref 12.0–15.0)
Immature Granulocytes: 0 %
Lymphocytes Relative: 26 %
Lymphs Abs: 1.6 10*3/uL (ref 0.7–4.0)
MCH: 29.2 pg (ref 26.0–34.0)
MCHC: 32.9 g/dL (ref 30.0–36.0)
MCV: 88.9 fL (ref 80.0–100.0)
Monocytes Absolute: 0.5 10*3/uL (ref 0.1–1.0)
Monocytes Relative: 8 %
Neutro Abs: 4 10*3/uL (ref 1.7–7.7)
Neutrophils Relative %: 65 %
Platelets: 170 10*3/uL (ref 150–400)
RBC: 3.59 MIL/uL — ABNORMAL LOW (ref 3.87–5.11)
RDW: 13 % (ref 11.5–15.5)
WBC: 6.2 10*3/uL (ref 4.0–10.5)
nRBC: 0 % (ref 0.0–0.2)

## 2022-10-14 LAB — URINALYSIS, ROUTINE W REFLEX MICROSCOPIC
Bacteria, UA: NONE SEEN
Bilirubin Urine: NEGATIVE
Glucose, UA: NEGATIVE mg/dL
Hgb urine dipstick: NEGATIVE
Ketones, ur: 20 mg/dL — AB
Leukocytes,Ua: NEGATIVE
Nitrite: NEGATIVE
Protein, ur: NEGATIVE mg/dL
Specific Gravity, Urine: >1.046 — ABNORMAL HIGH (ref 1.005–1.030)
Squamous Epithelial / HPF: NONE SEEN (ref 0–5)
pH: 5 (ref 5.0–8.0)

## 2022-10-14 LAB — COMPREHENSIVE METABOLIC PANEL
ALT: 20 U/L (ref 0–44)
AST: 27 U/L (ref 15–41)
Albumin: 3.6 g/dL (ref 3.5–5.0)
Alkaline Phosphatase: 44 U/L (ref 38–126)
Anion gap: 11 (ref 5–15)
BUN: 25 mg/dL — ABNORMAL HIGH (ref 8–23)
CO2: 25 mmol/L (ref 22–32)
Calcium: 8.9 mg/dL (ref 8.9–10.3)
Chloride: 107 mmol/L (ref 98–111)
Creatinine, Ser: 0.6 mg/dL (ref 0.44–1.00)
GFR, Estimated: >60 mL/min (ref >60)
Glucose, Bld: 99 mg/dL (ref 70–99)
Potassium: 3.1 mmol/L — ABNORMAL LOW (ref 3.5–5.1)
Sodium: 143 mmol/L (ref 135–145)
Total Bilirubin: 0.9 mg/dL (ref 0.3–1.2)
Total Protein: 6.6 g/dL (ref 6.5–8.1)

## 2022-10-14 LAB — RESP PANEL BY RT-PCR (RSV, FLU A&B, COVID)  RVPGX2
Influenza A by PCR: NEGATIVE
Influenza B by PCR: NEGATIVE
Resp Syncytial Virus by PCR: NEGATIVE
SARS Coronavirus 2 by RT PCR: NEGATIVE

## 2022-10-14 LAB — PROCALCITONIN: Procalcitonin: <0.1 ng/mL

## 2022-10-14 LAB — LACTIC ACID, PLASMA: Lactic Acid, Venous: 0.9 mmol/L (ref 0.5–1.9)

## 2022-10-14 LAB — LIPASE, BLOOD: Lipase: 42 U/L (ref 11–51)

## 2022-10-14 MED ORDER — BISACODYL 5 MG PO TBEC: 5.0000 mg | DELAYED_RELEASE_TABLET | Freq: Every day | ORAL | 1 refills | Status: AC | PRN

## 2022-10-14 MED ORDER — POTASSIUM CHLORIDE 20 MEQ PO PACK: 40.0000 meq | PACK | Freq: Every day | ORAL | Status: DC

## 2022-10-14 MED ORDER — POLYETHYLENE GLYCOL 3350 17 G PO PACK: 17.0000 g | PACK | Freq: Two times a day (BID) | ORAL | 0 refills | Status: AC

## 2022-10-14 MED ORDER — GLYCERIN (ADULT) 2 G RE SUPP: 1.0000 | RECTAL | 0 refills | Status: DC | PRN

## 2022-10-14 MED ORDER — IOHEXOL 300 MG/ML  SOLN
100.0000 mL | Freq: Once | INTRAMUSCULAR | Status: AC | PRN
  Administered 2022-10-14: 100 mL via INTRAVENOUS

## 2022-10-14 NOTE — ED Triage Notes (Signed)
First Nurse Note:  Pt via EMS from [facility]. Pt c/o fever this AM around 102.3 temp they gave tylenol, EMS report 98.1. Pt is at her baseline with dementia. Pt also reports congestion.   153 CBG 80 HR  91/44 BP

## 2022-10-14 NOTE — ED Notes (Signed)
[phone] 

## 2022-10-14 NOTE — ED Provider Notes (Addendum)
[facility] Provider Note    Event Date/Time   First MD Initiated Contact with Patient 10/14/22 1635     (approximate)   History   Fever   HPI  Joanna Reed is a 81 y.o. female with history of dementia, recurrent UTIs, who comes in from [place].  Due to concerns for fever.  Patient reportedly had a fever around 12 PM and given Tylenol.  Temperature was reportedly 102    Patient herself is not able to give any history.  I was able to call the facility reports patient having a fever and congestion and they said that she seemed to be more confused than normal.  They report this happens frequently when she gets UTI.     Physical Exam   Triage Vital Signs: ED Triage Vitals  Enc Vitals Group     BP 10/14/22 1431 132/79     Pulse Rate 10/14/22 1431 68     Resp 10/14/22 1431 16     Temp 10/14/22 1431 98 F (36.7 C)     Temp Source 10/14/22 1431 Oral     SpO2 10/14/22 1431 96 %     Weight 10/14/22 1432 106 lb 11.2 oz (48.4 kg)     Height 10/14/22 1432 '5\' 2"'$  (1.575 m)     Head Circumference --      Peak Flow --      Pain Score --      Pain Loc --      Pain Edu? --      Excl. in [place]? --     Most recent vital signs: Vitals:   10/14/22 1700 10/14/22 1715  BP: 139/78 139/78  Pulse:  74  Resp:  16  Temp:    SpO2:  100%     General: Awake, no distress.  CV:  Good peripheral perfusion.  Resp:  Normal effort.  Abd:  No distention.  Soft and nontender Other:  Patient is alert and oriented x 2.  Does seem confused off to bring up things that do not really make sense.  However she has full range of motion of neck and overall very well-appearing.   ED Results / Procedures / Treatments   Labs (all labs ordered are listed, but only abnormal results are displayed) Labs Reviewed  CBC WITH DIFFERENTIAL/PLATELET - Abnormal; Notable for the following components:      Result Value   RBC 3.59 (*)    Hemoglobin 10.5 (*)    HCT 31.9 (*)     All other components within normal limits  COMPREHENSIVE METABOLIC PANEL - Abnormal; Notable for the following components:   Potassium 3.1 (*)    BUN 25 (*)    All other components within normal limits  RESP PANEL BY RT-PCR (RSV, FLU A&B, COVID)  RVPGX2  CULTURE, BLOOD (ROUTINE X 2)  CULTURE, BLOOD (ROUTINE X 2)  LIPASE, BLOOD  PROCALCITONIN  LACTIC ACID, PLASMA  URINALYSIS, ROUTINE W REFLEX MICROSCOPIC  PROCALCITONIN      RADIOLOGY I have reviewed the xray personally and interpreted no pneumonia   PROCEDURES:  Critical Care performed: No  Procedures   MEDICATIONS ORDERED IN ED: Medications - No data to display   IMPRESSION / MDM / [place] / ED COURSE  I reviewed the triage vital signs and the nursing notes.   Patient's presentation is most consistent with acute presentation with potential threat to life or bodily function.   Patient comes in febrile from  nursing home with concerns for UTI.  History is limited due to patient's baseline dementia will get repeat CT head and CT abdomen pelvis given patient's exam is limited due to her dementia.  Do not see obvious evidence of an infection.  Testing was done to evaluate for COVID, flu, pneumonia.  Patient's hemoglobin is normal white count is normal.  CMP shows slightly low potassium we will give some p.o. medication.  Lipase normal procalcitonin is negative making bacterial infection less likely.  Her lactate was also normal.  COVID and flu is negative.  I do not see any evidence of any foot wounds or any signs of any other cause for the fever.  I rechecked the temperature at bedside and it was normal.   Pt handed off pending CT/UA   FINAL CLINICAL IMPRESSION(S) / ED DIAGNOSES   Final diagnoses:  Fever, unspecified fever cause     Rx / DC Orders   ED Discharge Orders     None        Note:  This document was prepared using Dragon voice recognition software and may include unintentional  dictation errors.   Vanessa [place], MD 10/14/22 1839    Vanessa Hosford, MD 10/14/22 [phone]

## 2022-10-14 NOTE — ED Notes (Signed)
Report to [facility] at [facility]. Sec to arrange transport back.

## 2022-10-14 NOTE — ED Notes (Addendum)
Pt was uncooperative with in and out cath, staff was unable to do at this time and pt then went to CT. Will re attempt.

## 2022-10-14 NOTE — Discharge Instructions (Signed)
Use laxatives and suppositories as needed for constipation and to promote bowel movement.  Thank you for choosing [place] for your health care today!  Please see your primary doctor this week for a follow up appointment.   Sometimes, in the early stages of certain disease courses it is difficult to detect in the emergency department evaluation -- so, it is important that you continue to monitor your symptoms and call your doctor right away or return to the emergency department if you develop any new or worsening symptoms.  Please go to the following website to schedule new (and existing) patient appointments:   [email or web address]  If you do not have a primary doctor try calling the following clinics to establish care:  If you have insurance:  [facility] [phone] [place] [address]   [facility]  [phone] [place] [place] [address]   If you do not have insurance:  [facility]  [phone] [address], [place] [address]   The following is another list of primary care offices in the area who are accepting new patients at this time.  Please reach out to one of them directly and let them know you would like to schedule an appointment to follow up on an Emergency Department visit, and/or to establish a new primary care provider (PCP).  There are likely other primary care clinics in the are who are accepting new patients, but this is an excellent place to start:  [place] physician: Dr Lavon Paganini [address] [place], [place] [address] [phone]  [facility] Lead Physician: Dr Steele Sizer [address], [place], [place] [address] [phone]  [place] Physician: Dr Park Liter [address] [place], [place] [address] [phone]  [facility] Lead Physician: Dr Dewaine Oats [place], [place], [place] [address] [phone]  [facility] [facility] [place] Physician: Dr Halina Maidens [address] [place] Broach [place], [place] [address] [phone]   It was my pleasure to care for you today.   Hoover Brunette Jacelyn Grip, MD

## 2022-10-14 NOTE — ED Provider Notes (Signed)
Signout received, see original note for full H&P.  Concern for fever from facility, patient is demented, no fever and normal hemodynamics on our assessment today.  Urinalysis performed and no signs of urinary tract infection.  CT abdomen pelvis shows constipation and a stool ball in the rectum, no intra-abdominal surgical pathologies or infectious etiologies.  Patient has been stable though agitated, I tried to perform a rectal exam to assess for stool ball and attempted fecal disimpaction but the patient became severely agitated and thrashing in bed and cursing this provider and staff.  Given risks of proceeding with this procedure and examination, deferred at this time and prescribed bowel regimen and suppositories to be given back at her facility.  Return precautions were written in her discharge instructions.     Joanna Garfinkel, MD 10/14/22 2126

## 2022-10-14 NOTE — ED Triage Notes (Signed)
Pt to ED by EMS for fever. Sent over by facility for fever which they treated at facility today. Pt is alert and slightly confused but baseline.

## 2022-10-14 NOTE — ED Notes (Signed)
Request made for [facility] to transfer to [facility]

## 2022-10-19 LAB — CULTURE, BLOOD (ROUTINE X 2)
Culture: NO GROWTH
Culture: NO GROWTH
Special Requests: ADEQUATE
Special Requests: ADEQUATE

## 2022-10-20 ENCOUNTER — Other Ambulatory Visit: Payer: Self-pay

## 2022-10-20 ENCOUNTER — Emergency Department: Payer: Medicare PPO

## 2022-10-20 ENCOUNTER — Encounter: Payer: Self-pay | Admitting: Emergency Medicine

## 2022-10-20 ENCOUNTER — Emergency Department
Admission: EM | Admit: 2022-10-20 | Discharge: 2022-10-20 | Disposition: A | Discharge status: Home / Self Care | Payer: Medicare PPO | Attending: Emergency Medicine | Admitting: Emergency Medicine

## 2022-10-20 DIAGNOSIS — M25551 Pain in right hip: Secondary | ICD-10-CM | POA: Diagnosis not present

## 2022-10-20 DIAGNOSIS — Y92129 Unspecified place in nursing home as the place of occurrence of the external cause: Secondary | ICD-10-CM | POA: Insufficient documentation

## 2022-10-20 DIAGNOSIS — W19XXXA Unspecified fall, initial encounter: Secondary | ICD-10-CM | POA: Insufficient documentation

## 2022-10-20 DIAGNOSIS — S0083XA Contusion of other part of head, initial encounter: Secondary | ICD-10-CM | POA: Insufficient documentation

## 2022-10-20 DIAGNOSIS — F0390 Unspecified dementia without behavioral disturbance: Secondary | ICD-10-CM | POA: Diagnosis not present

## 2022-10-20 DIAGNOSIS — S0011XA Contusion of right eyelid and periocular area, initial encounter: Secondary | ICD-10-CM | POA: Diagnosis not present

## 2022-10-20 DIAGNOSIS — S0990XA Unspecified injury of head, initial encounter: Secondary | ICD-10-CM | POA: Diagnosis present

## 2022-10-20 LAB — CBC
HCT: 33.8 % — ABNORMAL LOW (ref 36.0–46.0)
Hemoglobin: 11.2 g/dL — ABNORMAL LOW (ref 12.0–15.0)
MCH: 28.8 pg (ref 26.0–34.0)
MCHC: 33.1 g/dL (ref 30.0–36.0)
MCV: 86.9 fL (ref 80.0–100.0)
Platelets: 375 10*3/uL (ref 150–400)
RBC: 3.89 MIL/uL (ref 3.87–5.11)
RDW: 12.7 % (ref 11.5–15.5)
WBC: 12 10*3/uL — ABNORMAL HIGH (ref 4.0–10.5)
nRBC: 0 % (ref 0.0–0.2)

## 2022-10-20 LAB — BASIC METABOLIC PANEL
Anion gap: 8 (ref 5–15)
BUN: 22 mg/dL (ref 8–23)
CO2: 28 mmol/L (ref 22–32)
Calcium: 9.1 mg/dL (ref 8.9–10.3)
Chloride: 108 mmol/L (ref 98–111)
Creatinine, Ser: 0.61 mg/dL (ref 0.44–1.00)
GFR, Estimated: >60 mL/min (ref >60)
Glucose, Bld: 118 mg/dL — ABNORMAL HIGH (ref 70–99)
Potassium: 3 mmol/L — ABNORMAL LOW (ref 3.5–5.1)
Sodium: 144 mmol/L (ref 135–145)

## 2022-10-20 MED ORDER — POTASSIUM CHLORIDE CRYS ER 20 MEQ PO TBCR
40.0000 meq | EXTENDED_RELEASE_TABLET | Freq: Once | ORAL | Status: AC
  Administered 2022-10-20: 40 meq via ORAL
  Filled 2022-10-20: qty 2

## 2022-10-20 NOTE — Discharge Instructions (Addendum)
Your CT scan of the head, neck, and face do not reveal any fractures or other serious injuries.  An x-ray of your right hip also is negative for any fracture.  Continue taking all of your medications as usual.

## 2022-10-20 NOTE — ED Provider Notes (Signed)
[facility] Provider Note    Event Date/Time   First MD Initiated Contact with Patient 10/20/22 1116     (approximate)   History   Chief Complaint: Fall  History is limited by patient's advanced dementia HPI  Joanna Reed is a 81 y.o. female with a history of dementia, anxiety, depression who is sent to the ED from her skilled nursing facility due to an unwitnessed fall last night with visible signs of head trauma.  Patient reports some pain in the right side of her face as well as right hip.  She is not able to state how this happened.  Denies chest pain or shortness of breath.  No abdominal pain.     Physical Exam   Triage Vital Signs: ED Triage Vitals  Enc Vitals Group     BP 10/20/22 0841 132/69     Pulse Rate 10/20/22 0841 94     Resp 10/20/22 0841 19     Temp 10/20/22 0841 97.6 F (36.4 C)     Temp src --      SpO2 10/20/22 0841 100 %     Weight --      Height --      Head Circumference --      Peak Flow --      Pain Score 10/20/22 0839 0     Pain Loc --      Pain Edu? --      Excl. in [place]? --     Most recent vital signs: Vitals:   10/20/22 0841  BP: 132/69  Pulse: 94  Resp: 19  Temp: 97.6 F (36.4 C)  SpO2: 100%    General: Awake, no distress.  Oriented to self only.  Talkative and interactive CV:  Good peripheral perfusion.  Regular rate rhythm.  Normal distal pulses Resp:  Normal effort.  Clear to auscultation bilaterally Abd:  No distention.  Soft nontender Other:  There is ecchymosis over the right forehead and right periorbital space and right auricle.  There is palpable hematoma over the right forehead and right periorbital swelling.  EOMI, PERRL, no chemosis or irregular pupil or other signs of eye trauma.  There is tenderness at the right hip with some shortening of the leg and difficulty straighten the leg fully.  No crepitus, no wounds or bleeding.  No palpable hematoma.   ED Results / Procedures / Treatments    Labs (all labs ordered are listed, but only abnormal results are displayed) Labs Reviewed  BASIC METABOLIC PANEL - Abnormal; Notable for the following components:      Result Value   Potassium 3.0 (*)    Glucose, Bld 118 (*)    All other components within normal limits  CBC - Abnormal; Notable for the following components:   WBC 12.0 (*)    Hemoglobin 11.2 (*)    HCT 33.8 (*)    All other components within normal limits     EKG Interpreted by me Normal sinus rhythm rate of 88.  Normal axis, normal intervals.  Poor R wave progression.  Normal ST segments and T waves.  Pronounced artifact limits interpretation, no ischemic changes.   RADIOLOGY CT head interpreted by me, negative for mass or intracranial hemorrhage.  Radiology report reviewed.  CT cervical spine unremarkable  CT maxillofacial unremarkable  X-ray right hip pending   PROCEDURES:  Procedures   MEDICATIONS ORDERED IN ED: Medications  potassium chloride SA (KLOR-CON M) CR tablet 40 mEq (has no  administration in time range)     IMPRESSION / MDM / ASSESSMENT AND PLAN / ED COURSE  I reviewed the triage vital signs and the nursing notes.                              Differential diagnosis includes, but is not limited to, intracranial hemorrhage, skull fracture, C-spine fracture, maxillofacial fracture, orbital blowout fracture, right hip fracture, right hip dislocation  Patient's presentation is most consistent with acute presentation with potential threat to life or bodily function.  Patient presents with unwitnessed fall.  Limited ability to participate in history and physical due to her advanced dementia, but on exam she has obvious head trauma as well as right hip tenderness.  CT of the head neck and face does not reveal any serious injuries, trauma appears to be limited to soft tissue contusion, ecchymosis, hematoma.  This can resolve on its own.  Labs reveal hypokalemia 3.0.  Will plan to replace  orally after obtaining hip x-ray.  ----------------------------------------- 11:58 AM on 10/20/2022 ----------------------------------------- X-ray right hip interpreted by me, negative for fracture or dislocation.  Radiology report reviewed.  Oral potassium ordered.  Does not require admission.  Stable for discharge back to [facility].       FINAL CLINICAL IMPRESSION(S) / ED DIAGNOSES   Final diagnoses:  Fall, initial encounter  Chronic dementia ([place])  Traumatic hematoma of forehead, initial encounter     Rx / DC Orders   ED Discharge Orders     None        Note:  This document was prepared using Dragon voice recognition software and may include unintentional dictation errors.   Carrie Mew, MD 10/20/22 1158

## 2022-10-20 NOTE — ED Triage Notes (Signed)
Pt comes via EMs from [facility] with c/o unwitnessed fall last night. Pt has black eye and swelling noted to right eye. Unsure if any loc or hitting head. Pt is not on thinners. Pt does have some confusion per facility.

## 2023-01-11 ENCOUNTER — Other Ambulatory Visit: Payer: Self-pay

## 2023-01-11 ENCOUNTER — Encounter: Payer: Self-pay | Admitting: Emergency Medicine

## 2023-01-11 ENCOUNTER — Emergency Department
Admission: EM | Admit: 2023-01-11 | Discharge: 2023-01-11 | Disposition: A | Discharge status: Skilled Nursing Facility | Payer: Medicare PPO | Attending: Emergency Medicine | Admitting: Emergency Medicine

## 2023-01-11 ENCOUNTER — Emergency Department: Payer: Medicare PPO

## 2023-01-11 DIAGNOSIS — S0003XA Contusion of scalp, initial encounter: Secondary | ICD-10-CM | POA: Diagnosis not present

## 2023-01-11 DIAGNOSIS — W19XXXA Unspecified fall, initial encounter: Secondary | ICD-10-CM | POA: Diagnosis not present

## 2023-01-11 DIAGNOSIS — S0990XA Unspecified injury of head, initial encounter: Secondary | ICD-10-CM | POA: Diagnosis present

## 2023-01-11 DIAGNOSIS — F0390 Unspecified dementia without behavioral disturbance: Secondary | ICD-10-CM | POA: Diagnosis not present

## 2023-01-11 DIAGNOSIS — D649 Anemia, unspecified: Secondary | ICD-10-CM | POA: Diagnosis not present

## 2023-01-11 DIAGNOSIS — Y9289 Other specified places as the place of occurrence of the external cause: Secondary | ICD-10-CM | POA: Diagnosis not present

## 2023-01-11 DIAGNOSIS — S0083XA Contusion of other part of head, initial encounter: Secondary | ICD-10-CM | POA: Insufficient documentation

## 2023-01-11 HISTORY — DX: Unspecified dementia, unspecified severity, without behavioral disturbance, psychotic disturbance, mood disturbance, and anxiety: F03.90

## 2023-01-11 LAB — CBC
HCT: 30.8 % — ABNORMAL LOW (ref 36.0–46.0)
Hemoglobin: 9.8 g/dL — ABNORMAL LOW (ref 12.0–15.0)
MCH: 28.5 pg (ref 26.0–34.0)
MCHC: 31.8 g/dL (ref 30.0–36.0)
MCV: 89.5 fL (ref 80.0–100.0)
Platelets: 253 10*3/uL (ref 150–400)
RBC: 3.44 MIL/uL — ABNORMAL LOW (ref 3.87–5.11)
RDW: 12.7 % (ref 11.5–15.5)
WBC: 7.2 10*3/uL (ref 4.0–10.5)
nRBC: 0 % (ref 0.0–0.2)

## 2023-01-11 LAB — BASIC METABOLIC PANEL
Anion gap: 8 (ref 5–15)
BUN: 28 mg/dL — ABNORMAL HIGH (ref 8–23)
CO2: 24 mmol/L (ref 22–32)
Calcium: 8.7 mg/dL — ABNORMAL LOW (ref 8.9–10.3)
Chloride: 107 mmol/L (ref 98–111)
Creatinine, Ser: 0.58 mg/dL (ref 0.44–1.00)
GFR, Estimated: >60 mL/min (ref >60)
Glucose, Bld: 106 mg/dL — ABNORMAL HIGH (ref 70–99)
Potassium: 3.8 mmol/L (ref 3.5–5.1)
Sodium: 139 mmol/L (ref 135–145)

## 2023-01-11 MED ORDER — ACETAMINOPHEN 500 MG PO TABS: 500.0000 mg | ORAL_TABLET | Freq: Four times a day (QID) | ORAL | 0 refills | Status: AC | PRN

## 2023-01-11 NOTE — Discharge Instructions (Addendum)
You are seen in the emergency department following a fall.  Your CT scans did not show any internal bleeding.  You did not have any obvious broken bones.  Apply ice.  You can alternate Motrin and Tylenol as needed for pain control.  Follow-up closely with your primary care physician.  You did have findings of anemia, have your primary care physician follow-up your lab work.  Return for any worsening symptoms.

## 2023-01-11 NOTE — ED Provider Notes (Signed)
[facility] Provider Note    Event Date/Time   First MD Initiated Contact with Patient 01/11/23 2023     (approximate)   History   Fall   HPI  Joanna Reed is a 82 y.o. female past medical history significant for dementia who presents to the emergency department following fall.  Patient is a resident of [facility] and had an unwitnessed fall so she was sent to the emergency department.  Patient did have obvious head trauma.  No known anticoagulation use.  Patient without complaints and denies any pain at this time.  Unknown baseline.     Physical Exam   Triage Vital Signs: ED Triage Vitals  Enc Vitals Group     BP 01/11/23 2033 (!) 144/70     Pulse Rate 01/11/23 2033 76     Resp 01/11/23 2033 14     Temp 01/11/23 2033 97.9 F (36.6 C)     Temp Source 01/11/23 2033 Oral     SpO2 01/11/23 2033 98 %     Weight 01/11/23 2035 109 lb (49.4 kg)     Height 01/11/23 2035 5\' 2"  (1.575 m)     Head Circumference --      Peak Flow --      Pain Score --      Pain Loc --      Pain Edu? --      Excl. in [place]? --     Most recent vital signs: Vitals:   01/11/23 2033  BP: (!) 144/70  Pulse: 76  Resp: 14  Temp: 97.9 F (36.6 C)  SpO2: 98%    Physical Exam Constitutional:      Appearance: She is well-developed.  HENT:     Head:     Comments: Abrasion to the forehead    Right Ear: External ear normal.     Left Ear: External ear normal.     Nose: Nasal deformity present. No septal deviation.     Comments: No septal hematoma Eyes:     Extraocular Movements: Extraocular movements intact.     Conjunctiva/sclera: Conjunctivae normal.     Pupils: Pupils are equal, round, and reactive to light.  Cardiovascular:     Rate and Rhythm: Normal rate and regular rhythm.  Pulmonary:     Effort: No respiratory distress.  Abdominal:     General: There is no distension.     Tenderness: There is no abdominal tenderness.  Musculoskeletal:        General:  No tenderness. Normal range of motion.     Cervical back: Normal range of motion. No tenderness.     Comments: No tenderness to palpation to the chest wall.  No tenderness to bilateral upper or lower extremities.  No tenderness to the hips or back.  No midline thoracic or lumbar tenderness to palpation.  Skin:    General: Skin is warm.     Capillary Refill: Capillary refill takes less than 2 seconds.  Neurological:     General: No focal deficit present.     Mental Status: She is alert. Mental status is at baseline. She is disoriented.  Psychiatric:        Mood and Affect: Mood normal.     IMPRESSION / MDM / [place] / ED COURSE  I reviewed the triage vital signs and the nursing notes.  Presents following a an unwitnessed fall.  On chart review patient has a history of recurrent falls.  Not on  anticoagulation.  Differential diagnosis including intracranial hemorrhage, facial fracture, concussion.  Given the unwitnessed fall will obtain lab work and urine. EKG  I, Nathaniel Man, the attending physician, personally viewed and interpreted this ECG.   Rate: Normal  Rhythm: Normal sinus  Axis: Normal  Intervals: Normal  ST&T Change: None Significant artifact  No tachycardic or bradycardic dysrhythmias while on cardiac telemetry.  RADIOLOGY I independently reviewed imaging, my interpretation of imaging: CT scan of the head and CT face -no intracranial hemorrhage.  No obvious fracture.  LABS (all labs ordered are listed, but only abnormal results are displayed) Labs interpreted as -    Labs Reviewed  BASIC METABOLIC PANEL - Abnormal; Notable for the following components:      Result Value   Glucose, Bld 106 (*)    BUN 28 (*)    Calcium 8.7 (*)    All other components within normal limits  CBC - Abnormal; Notable for the following components:   RBC 3.44 (*)    Hemoglobin 9.8 (*)    HCT 30.8 (*)    All other components within normal limits     TREATMENT    MDM  No signs of basilar skull fracture.  No midline cervical spine tenderness to palpation of low suspicion for cervical spine fracture.  No tenderness to the chest or bilateral hips.  Patient appears to be at her mental status baseline.  CT scan with no acute signs of trauma.  Notes electrolyte abnormalities.  Patient does have findings of anemia with a hemoglobin of 9.8.  No signs of a GI bleed.  Not on anticoagulation.  Given information to follow-up with her primary care physician for hemoglobin recheck.  Able to ambulate in the emergency department.  Will discharge the patient back to her nursing facility.  Given return precautions.     PROCEDURES:  Critical Care performed: No  Procedures  Patient's presentation is most consistent with acute presentation with potential threat to life or bodily function.   MEDICATIONS ORDERED IN ED: Medications - No data to display  FINAL CLINICAL IMPRESSION(S) / ED DIAGNOSES   Final diagnoses:  Contusion of face, initial encounter  Injury of head, initial encounter  Contusion of scalp, initial encounter     Rx / DC Orders   ED Discharge Orders     None        Note:  This document was prepared using Dragon voice recognition software and may include unintentional dictation errors.   Nathaniel Man, MD 01/11/23 2132

## 2023-01-11 NOTE — ED Triage Notes (Signed)
Pt to ED via EMS from [facility] c/o unwittnessed fall tonight.  Per EMS pt has hx of dementia, unsure mental baseline, unknown blood thinners.  Pt has lac to bridge of nose and states pressure to forehead.  Pt is alert, oriented to place, disoriented to time and situation.  EMS vitals 153/86 BP, 97% RA, 82 HR, CBG 139, and given 500cc LR.

## 2023-01-11 NOTE — ED Notes (Signed)
NT told this RN this patient stated "she wanted to harm herself." Informed MD and she wanted this RN to ask patient if she wanted to harm herself. When asking patient if she wanted to hurt herself in anyway she stated "how am I even suppose to answer something like that" Implying no. Pt also continuously stated that I had not been in the room for over 3 hours and this RN had not even been in patients care for that amount of time. Pt also stated other random things that did not make sense. Will notify facility of this information.

## 2023-01-11 NOTE — ED Notes (Signed)
Blue and red top tube sent to lab with save label

## 2023-01-11 NOTE — ED Notes (Signed)
Patient report and discharge instructions given to [facility] at [facility].

## 2023-06-13 ENCOUNTER — Telehealth: Payer: Self-pay | Admitting: Primary Care

## 2023-06-13 NOTE — Telephone Encounter (Signed)
Called patient number on file and not in service

## 2023-09-30 ENCOUNTER — Other Ambulatory Visit: Payer: Self-pay

## 2023-09-30 ENCOUNTER — Emergency Department: Payer: Medicare PPO

## 2023-09-30 ENCOUNTER — Observation Stay
Admission: EM | Admit: 2023-09-30 | Discharge: 2023-10-09 | Disposition: A | Discharge status: Home Health | Payer: Medicare PPO | Attending: Hospitalist | Admitting: Hospitalist

## 2023-09-30 DIAGNOSIS — I10 Essential (primary) hypertension: Secondary | ICD-10-CM | POA: Diagnosis not present

## 2023-09-30 DIAGNOSIS — N390 Urinary tract infection, site not specified: Secondary | ICD-10-CM | POA: Diagnosis not present

## 2023-09-30 DIAGNOSIS — K7689 Other specified diseases of liver: Secondary | ICD-10-CM | POA: Insufficient documentation

## 2023-09-30 DIAGNOSIS — I700 Atherosclerosis of aorta: Secondary | ICD-10-CM | POA: Insufficient documentation

## 2023-09-30 DIAGNOSIS — R296 Repeated falls: Principal | ICD-10-CM | POA: Insufficient documentation

## 2023-09-30 DIAGNOSIS — F32A Depression, unspecified: Secondary | ICD-10-CM | POA: Diagnosis not present

## 2023-09-30 DIAGNOSIS — S299XXA Unspecified injury of thorax, initial encounter: Secondary | ICD-10-CM | POA: Diagnosis present

## 2023-09-30 DIAGNOSIS — J90 Pleural effusion, not elsewhere classified: Secondary | ICD-10-CM | POA: Insufficient documentation

## 2023-09-30 DIAGNOSIS — E876 Hypokalemia: Secondary | ICD-10-CM | POA: Diagnosis not present

## 2023-09-30 DIAGNOSIS — F419 Anxiety disorder, unspecified: Secondary | ICD-10-CM | POA: Insufficient documentation

## 2023-09-30 DIAGNOSIS — F0390 Unspecified dementia without behavioral disturbance: Secondary | ICD-10-CM | POA: Diagnosis not present

## 2023-09-30 DIAGNOSIS — J439 Emphysema, unspecified: Secondary | ICD-10-CM | POA: Insufficient documentation

## 2023-09-30 DIAGNOSIS — W19XXXA Unspecified fall, initial encounter: Secondary | ICD-10-CM | POA: Insufficient documentation

## 2023-09-30 DIAGNOSIS — S2241XA Multiple fractures of ribs, right side, initial encounter for closed fracture: Secondary | ICD-10-CM | POA: Diagnosis not present

## 2023-09-30 MED ORDER — ACETAMINOPHEN 500 MG PO TABS
1000.0000 mg | ORAL_TABLET | Freq: Once | ORAL | Status: AC
  Administered 2023-09-30: 1000 mg via ORAL
  Filled 2023-09-30: qty 2

## 2023-09-30 NOTE — ED Provider Notes (Incomplete)
[facility] Provider Note    Event Date/Time   First MD Initiated Contact with Patient 09/30/23 2259     (approximate)   History   Fall   HPI  Joanna Reed is a 82 y.o. female with history of dementia who presents to the emergency department after unwitnessed fall at her nursing facility.  Patient unable to provide any history.  On examination, she is complaining of back pain with palpation.  No signs of traumatic injury on exam.   History provided by level 5 caveat secondary to dementia.    Past Medical History:  Diagnosis Date   Anxiety and depression    Dementia (HCC)     Past Surgical History:  Procedure Laterality Date   ABDOMINAL HYSTERECTOMY     1980    MEDICATIONS:  Prior to Admission medications   Medication Sig Start Date End Date Taking? Authorizing Provider  amLODipine (NORVASC) 5 MG tablet Take 5 mg by mouth daily. 08/23/22   [provider]  Apoaequorin (PREVAGEN) 10 MG CAPS Take 10 mg by mouth daily.    [provider]  Coenzyme Q10 (CO Q-10) 400 MG CAPS Take 400 mg by mouth daily.    [provider]  diclofenac Sodium (VOLTAREN) 1 % GEL Apply 2 g topically 2 (two) times daily. Apply 2 grams topically to left knee twice daily. 03/30/22   [provider]  docusate sodium (COLACE) 100 MG capsule Take 100 mg by mouth daily.    [provider]  donepezil (ARICEPT) 10 MG tablet Take 10 mg by mouth daily. 08/23/22   [provider]  folic acid (FOLVITE) 1 MG tablet Take 1 mg by mouth daily.    [provider]  glycerin adult 2 g suppository Place 1 suppository rectally as needed for constipation. 10/14/22   Pilar Jarvis, MD  LORazepam (ATIVAN) 0.5 MG tablet Take 0.5 tablets (0.25 mg total) by mouth every 12 (twelve) hours as needed for anxiety. 09/19/22   Burnadette Pop, MD  Multiple Vitamin (MULTIVITAMIN WITH MINERALS) TABS tablet Take 1 tablet by mouth daily.    [provider]  QUEtiapine (SEROQUEL) 25 MG tablet Take 1 tablet (25 mg total) by mouth at bedtime. Patient not taking: Reported on 10/14/2022 09/19/22   Burnadette Pop, MD  risperiDONE (RISPERDAL) 0.25 MG tablet Take 0.25 mg by mouth 2 (two) times daily.    [provider]  sertraline (ZOLOFT) 100 MG tablet Take 200 mg by mouth daily. 08/23/22   [provider]    Physical Exam   Triage Vital Signs: ED Triage Vitals  Encounter Vitals Group     BP 09/30/23 2246 134/71     Systolic BP Percentile --      Diastolic BP Percentile --      Pulse Rate 09/30/23 2246 69     Resp 09/30/23 2246 18     Temp 09/30/23 2246 97.6 F (36.4 C)     Temp Source 09/30/23 2246 Oral     SpO2 09/30/23 2246 98 %     Weight 09/30/23 2243 110 lb 3.7 oz (50 kg)     Height 09/30/23 2243 5' (1.524 m)     Head Circumference --      Peak Flow --      Pain Score 09/30/23 2243 0     Pain Loc --      Pain Education --      Exclude from Growth Chart --  Most recent vital signs: Vitals:   10/01/23 0100 10/01/23 0130  BP: 128/70 121/67  Pulse: 72 78  Resp: (!) 21 (!) 21  Temp:    SpO2: 100% 98%     CONSTITUTIONAL: Alert, elderly, demented, unable to answer questions appropriately HEAD: Normocephalic; atraumatic EYES: Conjunctivae clear, PERRL, EOMI ENT: normal nose; no rhinorrhea; moist mucous membranes; pharynx without lesions noted; no dental injury; no septal hematoma, no epistaxis; no facial deformity or bony tenderness NECK: Supple, no midline spinal tenderness, step-off or deformity; trachea midline CARD: RRR; S1 and S2 appreciated; no murmurs, no clicks, no rubs, no gallops RESP: Normal chest excursion without splinting or tachypnea; breath sounds clear and equal bilaterally; no wheezes, no rhonchi, no rales; no hypoxia or respiratory distress CHEST:  chest wall stable, no crepitus or ecchymosis or deformity, nontender to palpation; no flail chest ABD/GI: Non-distended; soft,  non-tender, no rebound, no guarding; no ecchymosis or other lesions noted PELVIS:  stable, nontender to palpation BACK:  The back appears normal; tender to palpation over the thoracic and lumbar spine, no bruising, rashes, lesions noted to the back EXT: Normal ROM in all joints; no edema; normal capillary refill; no cyanosis, no bony tenderness or bony deformity of patient's extremities, no joint effusions, compartments are soft, extremities are warm and well-perfused, no ecchymosis SKIN: Normal color for age and race; warm NEURO: No facial asymmetry, rambling speech, moving all extremities  ED Results / Procedures / Treatments   LABS: (all labs ordered are listed, but only abnormal results are displayed) Labs Reviewed  CBC WITH DIFFERENTIAL/PLATELET - Abnormal; Notable for the following components:      Result Value   WBC 15.3 (*)    Hemoglobin 11.5 (*)    HCT 34.2 (*)    Neutro Abs 12.9 (*)    All other components within normal limits  COMPREHENSIVE METABOLIC PANEL - Abnormal; Notable for the following components:   Potassium 3.4 (*)    Glucose, Bld 129 (*)    BUN 24 (*)    Calcium 8.7 (*)    All other components within normal limits  URINALYSIS, ROUTINE W REFLEX MICROSCOPIC - Abnormal; Notable for the following components:   Hgb urine dipstick LARGE (*)    Protein, ur 100 (*)    Nitrite POSITIVE (*)    Leukocytes,Ua LARGE (*)    All other components within normal limits  URINALYSIS, MICROSCOPIC (REFLEX) - Abnormal; Notable for the following components:   Bacteria, UA MANY (*)    All other components within normal limits  CBG MONITORING, ED - Abnormal; Notable for the following components:   Glucose-Capillary 122 (*)    All other components within normal limits  URINE CULTURE     EKG:  EKG Interpretation Date/Time:  Monday October 01 2023 00:35:50 EST Ventricular Rate:  74 PR Interval:  143 QRS Duration:  99 QT Interval:  396 QTC Calculation: 440 R  Axis:   79  Text Interpretation: Sinus rhythm Confirmed by Rochele Raring [phone]) on 10/01/2023 1:14:51 AM          RADIOLOGY: My personal review and interpretation of imaging: CT scan showed multiple rib fractures.  I have personally reviewed all radiology reports. CT CHEST ABDOMEN PELVIS W CONTRAST  Result Date: 10/01/2023 CLINICAL DATA:  Unwitnessed fall with known rib fractures, initial encounter EXAM: CT CHEST, ABDOMEN, AND PELVIS WITH CONTRAST TECHNIQUE: Multidetector CT imaging of the chest, abdomen and pelvis was performed following the standard protocol during bolus administration of intravenous contrast.  RADIATION DOSE REDUCTION: This exam was performed according to the departmental dose-optimization program which includes automated exposure control, adjustment of the mA and/or kV according to patient size and/or use of iterative reconstruction technique. CONTRAST:  75mL OMNIPAQUE IOHEXOL 350 MG/ML SOLN COMPARISON:  10/14/2022 FINDINGS: CT CHEST FINDINGS Cardiovascular: Atherosclerotic calcifications of the thoracic aorta are noted. No aneurysmal dilatation or dissection is seen. Heart is mildly enlarged in size. Pulmonary artery shows no filling defect to suggest pulmonary embolism. Mediastinum/Nodes: Thoracic inlet is within normal limits. No hilar or mediastinal adenopathy is noted. The esophagus as visualized is within normal limits. Lungs/Pleura: Lungs are well aerated bilaterally. No focal infiltrate or sizable effusion is seen. No pneumothorax is noted. Very mild emphysematous changes are seen. Small right-sided pleural effusion is noted. Musculoskeletal: Right lateral rib fractures are noted involving the sixth through tenth ribs without evidence of underlying pneumothorax. Degenerative changes of the thoracic spine are seen. No compression deformity is noted. CT ABDOMEN PELVIS FINDINGS Hepatobiliary: Stable hepatic cyst is noted posteriorly in the right lobe of the liver. The  gallbladder is within normal limits. Pancreas: Unremarkable. No pancreatic ductal dilatation or surrounding inflammatory changes. Spleen: Normal in size without focal abnormality. Adrenals/Urinary Tract: Adrenal glands are within normal limits. Kidneys demonstrate a normal enhancement pattern bilaterally. Left renal cyst is noted stable from the prior study. No further follow-up is recommended. No obstructive changes are seen. The bladder is decompressed. Stomach/Bowel: Mild diverticular change of the colon is noted without evidence of diverticulitis. The appendix is within normal limits. Small bowel and stomach are unremarkable. Vascular/Lymphatic: Aortic atherosclerosis. No enlarged abdominal or pelvic lymph nodes. Reproductive: Status post hysterectomy. No adnexal masses. Other: No abdominal wall hernia or abnormality. No abdominopelvic ascites. Musculoskeletal: Degenerative changes are noted without acute abnormality. IMPRESSION: CT of the chest: Multiple right rib fractures as described without complicating factors. CT of the abdomen and pelvis: Diverticular change without diverticulitis. No acute abnormality noted. Aortic Atherosclerosis (ICD10-I70.0) and Emphysema (ICD10-J43.9). Electronically Signed   By: Alcide Clever M.D.   On: 10/01/2023 02:09   CT Thoracic Spine Wo Contrast  Result Date: 10/01/2023 CLINICAL DATA:  Trauma EXAM: CT THORACIC SPINE WITHOUT CONTRAST TECHNIQUE: Multidetector CT images of the thoracic were obtained using the standard protocol without intravenous contrast. RADIATION DOSE REDUCTION: This exam was performed according to the departmental dose-optimization program which includes automated exposure control, adjustment of the mA and/or kV according to patient size and/or use of iterative reconstruction technique. COMPARISON:  None Available. FINDINGS: Alignment: Normal thoracic kyphosis. Vertebrae: No acute fracture or focal pathologic process. Paraspinal and other soft tissues:  Thoracic aortic atherosclerosis. Trace right pleural effusion. Nondisplaced right lateral 6 through 8th rib fractures (series 3/images 48, 57, and 67). No pneumothorax. 3.0 cm posterior right hepatic lobe cyst, benign. Disc levels: Mild degenerative changes, most prominent at T12-L1. Spinal canal is patent. IMPRESSION: No traumatic injury to the thoracic spine. Nondisplaced right lateral 6 through 8th rib fractures. Trace right pleural effusion. No pneumothorax. Electronically Signed   By: Charline Bills M.D.   On: 10/01/2023 00:05   CT Lumbar Spine Wo Contrast  Result Date: 10/01/2023 CLINICAL DATA:  Trauma EXAM: CT LUMBAR SPINE WITHOUT CONTRAST TECHNIQUE: Multidetector CT imaging of the lumbar spine was performed without intravenous contrast administration. Multiplanar CT image reconstructions were also generated. RADIATION DOSE REDUCTION: This exam was performed according to the departmental dose-optimization program which includes automated exposure control, adjustment of the mA and/or kV according to patient size and/or use of  iterative reconstruction technique. COMPARISON:  CT abdomen/pelvis dated 10/14/2022 FINDINGS: Segmentation: 5 lumbar type vertebral bodies. Alignment: Normal lumbar lordosis. Vertebrae: No acute fracture or focal pathologic process. Paraspinal and other soft tissues: Trace right pleural effusion. 17 mm simple left upper pole renal cyst (series 4/image 31), benign (Bosniak I). No follow-up is recommended. Posterior right hepatic cyst, incompletely visualized. Vascular calcifications. Disc levels: Mild multilevel degenerative changes, most prominent at T12-L1 and L5-S1. Spinal canal is patent. IMPRESSION: No traumatic injury to the lumbar spine. Mild multilevel degenerative changes. Electronically Signed   By: Charline Bills M.D.   On: 10/01/2023 00:02   CT HEAD WO CONTRAST ( )  Result Date: 10/01/2023 CLINICAL DATA:  Trauma EXAM: CT HEAD WITHOUT CONTRAST CT CERVICAL SPINE  WITHOUT CONTRAST TECHNIQUE: Multidetector CT imaging of the head and cervical spine was performed following the standard protocol without intravenous contrast. Multiplanar CT image reconstructions of the cervical spine were also generated. RADIATION DOSE REDUCTION: This exam was performed according to the departmental dose-optimization program which includes automated exposure control, adjustment of the mA and/or kV according to patient size and/or use of iterative reconstruction technique. COMPARISON:  01/11/2023 FINDINGS: CT HEAD FINDINGS Brain: No evidence of acute infarction, hemorrhage, hydrocephalus, extra-axial collection or mass lesion/mass effect. Global cortical and central atrophy. Secondary ventricular prominence. Subcortical white matter and periventricular small vessel ischemic changes. Vascular: No hyperdense vessel or unexpected calcification. Skull: Normal. Negative for fracture or focal lesion. Sinuses/Orbits: The visualized paranasal sinuses are essentially clear. The mastoid air cells are unopacified. Other: None. CT CERVICAL SPINE FINDINGS Alignment: Normal cervical lordosis. Skull base and vertebrae: No acute fracture. No primary bone lesion or focal pathologic process. Soft tissues and spinal canal: No prevertebral fluid or swelling. No visible canal hematoma. Disc levels: Mild degenerative changes of the mid/lower cervical spine. Spinal canal is patent. Upper chest: Visualized lung apices are clear. Other: Visualized thyroid is unremarkable. IMPRESSION: No acute intracranial abnormality. Atrophy with small vessel ischemic changes. No traumatic injury to the cervical spine. Mild degenerative changes. Electronically Signed   By: Charline Bills M.D.   On: 10/01/2023 00:01   CT Cervical Spine Wo Contrast  Result Date: 10/01/2023 CLINICAL DATA:  Trauma EXAM: CT HEAD WITHOUT CONTRAST CT CERVICAL SPINE WITHOUT CONTRAST TECHNIQUE: Multidetector CT imaging of the head and cervical spine was  performed following the standard protocol without intravenous contrast. Multiplanar CT image reconstructions of the cervical spine were also generated. RADIATION DOSE REDUCTION: This exam was performed according to the departmental dose-optimization program which includes automated exposure control, adjustment of the mA and/or kV according to patient size and/or use of iterative reconstruction technique. COMPARISON:  01/11/2023 FINDINGS: CT HEAD FINDINGS Brain: No evidence of acute infarction, hemorrhage, hydrocephalus, extra-axial collection or mass lesion/mass effect. Global cortical and central atrophy. Secondary ventricular prominence. Subcortical white matter and periventricular small vessel ischemic changes. Vascular: No hyperdense vessel or unexpected calcification. Skull: Normal. Negative for fracture or focal lesion. Sinuses/Orbits: The visualized paranasal sinuses are essentially clear. The mastoid air cells are unopacified. Other: None. CT CERVICAL SPINE FINDINGS Alignment: Normal cervical lordosis. Skull base and vertebrae: No acute fracture. No primary bone lesion or focal pathologic process. Soft tissues and spinal canal: No prevertebral fluid or swelling. No visible canal hematoma. Disc levels: Mild degenerative changes of the mid/lower cervical spine. Spinal canal is patent. Upper chest: Visualized lung apices are clear. Other: Visualized thyroid is unremarkable. IMPRESSION: No acute intracranial abnormality. Atrophy with small vessel ischemic changes. No traumatic injury to the  cervical spine. Mild degenerative changes. Electronically Signed   By: Charline Bills M.D.   On: 10/01/2023 00:01     PROCEDURES:  Critical Care performed: No   Procedures    IMPRESSION / MDM / ASSESSMENT AND PLAN / ED COURSE  I reviewed the triage vital signs and the nursing notes.  Patient here after unwitnessed fall from her nursing facility.     DIFFERENTIAL DIAGNOSIS (includes but not limited to):    Fall, head injury, intracranial hemorrhage, skull fracture, cervical spine fracture, thoracic or lumbar fracture, contusion  Patient's presentation is most consistent with acute presentation with potential threat to life or bodily function.  PLAN: Will obtain CT imaging of the head, spine.  Will give Tylenol for discomfort.  Will check CBG.   MEDICATIONS GIVEN IN ED: Medications  cefTRIAXone (ROCEPHIN) 1 g in sodium chloride 0.9 % 100 mL IVPB (1 g Intravenous New Bag/Given 10/01/23 0156)  acetaminophen (TYLENOL) tablet 1,000 mg (1,000 mg Oral Given 09/30/23 2353)  iohexol (OMNIPAQUE) 350 MG/ML injection 75 mL (75 mLs Intravenous Contrast Given 10/01/23 0140)     ED COURSE: CTs reviewed and interpreted by myself the radiologist.  No injury to the head or cervical spine.  Thoracic and lumbar spine also appear normal but patient does have nondisplaced right sixth, seventh and eighth rib fractures.  No pneumothorax.  Given 3 rib fractures in an 82 year old female, I worry about her developing hypoxia, pneumonia and she may need admission for pulmonary toilet and monitoring.  Will obtain CTs of her chest, abdomen pelvis to rule out other injuries and obtain labs, urine today.   2:14 AM  Pt's labs show leukocytosis of 15,000 with left shift.  Normal electrolytes, creatinine, LFTs.  Patient has a nitrite positive UTI.  Culture pending.  Will give Rocephin.  Additional CT imaging shows no new acute finding and again redemonstrates acute right-sided rib fractures without pneumothorax.  Will admit for IV antibiotics for UTI and pulmonary toilet for rib fractures.  CONSULTS:  Consulted and discussed patient's case with hospitalist, Dr. Para March.  I have recommended admission and consulting physician agrees and will place admission orders.  Patient (and family if present) agree with this plan.   I reviewed all nursing notes, vitals, pertinent previous records.  All labs, EKGs, imaging ordered have been  independently reviewed and interpreted by myself.    OUTSIDE RECORDS REVIEWED: Reviewed last admission in November 2023.    3:15 AM  Updated patient's daughter by phone who currently lives in [place].  She is appreciative of care and agrees with medical admission.   FINAL CLINICAL IMPRESSION(S) / ED DIAGNOSES   Final diagnoses:  Unwitnessed fall  Multiple fractures of ribs, right side, initial encounter for closed fracture  Acute UTI     Rx / DC Orders   ED Discharge Orders     None        Note:  This document was prepared using Dragon voice recognition software and may include unintentional dictation errors.   Majed Pellegrin, Layla Maw, DO 10/01/23 0215    Derell Bruun, Layla Maw, DO 10/01/23 [phone]

## 2023-09-30 NOTE — ED Triage Notes (Signed)
Unwithnessed fall on left side from sitting positon on bed, patient history of dementia, not on thinners, facility denies any head trauma or loc

## 2023-10-01 ENCOUNTER — Emergency Department: Payer: Medicare PPO

## 2023-10-01 DIAGNOSIS — S2241XA Multiple fractures of ribs, right side, initial encounter for closed fracture: Secondary | ICD-10-CM | POA: Diagnosis not present

## 2023-10-01 DIAGNOSIS — R296 Repeated falls: Principal | ICD-10-CM

## 2023-10-01 LAB — COMPREHENSIVE METABOLIC PANEL
ALT: 15 U/L (ref 0–44)
AST: 21 U/L (ref 15–41)
Albumin: 4.1 g/dL (ref 3.5–5.0)
Alkaline Phosphatase: 68 U/L (ref 38–126)
Anion gap: 10 (ref 5–15)
BUN: 24 mg/dL — ABNORMAL HIGH (ref 8–23)
CO2: 26 mmol/L (ref 22–32)
Calcium: 8.7 mg/dL — ABNORMAL LOW (ref 8.9–10.3)
Chloride: 104 mmol/L (ref 98–111)
Creatinine, Ser: 0.62 mg/dL (ref 0.44–1.00)
GFR, Estimated: >60 mL/min (ref >60)
Glucose, Bld: 129 mg/dL — ABNORMAL HIGH (ref 70–99)
Potassium: 3.4 mmol/L — ABNORMAL LOW (ref 3.5–5.1)
Sodium: 140 mmol/L (ref 135–145)
Total Bilirubin: 0.5 mg/dL (ref <1.2)
Total Protein: 6.8 g/dL (ref 6.5–8.1)

## 2023-10-01 LAB — URINALYSIS, MICROSCOPIC (REFLEX)
Squamous Epithelial / HPF: >50 /[HPF] (ref 0–5)
WBC, UA: >50 WBC/hpf (ref 0–5)

## 2023-10-01 LAB — CBC WITH DIFFERENTIAL/PLATELET
Abs Immature Granulocytes: 0.06 10*3/uL (ref 0.00–0.07)
Basophils Absolute: 0 10*3/uL (ref 0.0–0.1)
Basophils Relative: 0 %
Eosinophils Absolute: 0 10*3/uL (ref 0.0–0.5)
Eosinophils Relative: 0 %
HCT: 34.2 % — ABNORMAL LOW (ref 36.0–46.0)
Hemoglobin: 11.5 g/dL — ABNORMAL LOW (ref 12.0–15.0)
Immature Granulocytes: 0 %
Lymphocytes Relative: 8 %
Lymphs Abs: 1.3 10*3/uL (ref 0.7–4.0)
MCH: 29.6 pg (ref 26.0–34.0)
MCHC: 33.6 g/dL (ref 30.0–36.0)
MCV: 87.9 fL (ref 80.0–100.0)
Monocytes Absolute: 1 10*3/uL (ref 0.1–1.0)
Monocytes Relative: 6 %
Neutro Abs: 12.9 10*3/uL — ABNORMAL HIGH (ref 1.7–7.7)
Neutrophils Relative %: 86 %
Platelets: 269 10*3/uL (ref 150–400)
RBC: 3.89 MIL/uL (ref 3.87–5.11)
RDW: 12.5 % (ref 11.5–15.5)
WBC: 15.3 10*3/uL — ABNORMAL HIGH (ref 4.0–10.5)
nRBC: 0 % (ref 0.0–0.2)

## 2023-10-01 LAB — URINALYSIS, ROUTINE W REFLEX MICROSCOPIC
Bilirubin Urine: NEGATIVE
Glucose, UA: NEGATIVE mg/dL
Ketones, ur: NEGATIVE mg/dL
Nitrite: POSITIVE — AB
Protein, ur: 100 mg/dL — AB
Specific Gravity, Urine: 1.02 (ref 1.005–1.030)
pH: 7.5 (ref 5.0–8.0)

## 2023-10-01 LAB — MAGNESIUM: Magnesium: 2.1 mg/dL (ref 1.7–2.4)

## 2023-10-01 LAB — CBG MONITORING, ED: Glucose-Capillary: 122 mg/dL — ABNORMAL HIGH (ref 70–99)

## 2023-10-01 MED ORDER — POTASSIUM CHLORIDE CRYS ER 20 MEQ PO TBCR
40.0000 meq | EXTENDED_RELEASE_TABLET | Freq: Once | ORAL | Status: AC
  Administered 2023-10-01: 40 meq via ORAL
  Filled 2023-10-01: qty 2

## 2023-10-01 MED ORDER — SERTRALINE HCL 50 MG PO TABS
200.0000 mg | ORAL_TABLET | Freq: Every day | ORAL | Status: DC
  Administered 2023-10-01 – 2023-10-09 (×9): 200 mg via ORAL
  Filled 2023-10-01 (×9): qty 4

## 2023-10-01 MED ORDER — HYDROCODONE-ACETAMINOPHEN 5-325 MG PO TABS
1.0000 | ORAL_TABLET | ORAL | Status: DC | PRN
Start: 2023-10-01 — End: 2023-10-09

## 2023-10-01 MED ORDER — MELATONIN 5 MG PO TABS
5.0000 mg | ORAL_TABLET | Freq: Every evening | ORAL | Status: DC | PRN
  Administered 2023-10-08: 5 mg via ORAL
  Filled 2023-10-01: qty 1

## 2023-10-01 MED ORDER — RISPERIDONE 0.25 MG PO TABS
0.2500 mg | ORAL_TABLET | Freq: Two times a day (BID) | ORAL | Status: DC
  Administered 2023-10-01 – 2023-10-09 (×17): 0.25 mg via ORAL
  Filled 2023-10-01 (×17): qty 1

## 2023-10-01 MED ORDER — ACETAMINOPHEN 650 MG RE SUPP: 650.0000 mg | Freq: Four times a day (QID) | RECTAL | Status: DC | PRN

## 2023-10-01 MED ORDER — DONEPEZIL HCL 5 MG PO TABS
10.0000 mg | ORAL_TABLET | Freq: Every day | ORAL | Status: DC
  Administered 2023-10-01 – 2023-10-09 (×9): 10 mg via ORAL
  Filled 2023-10-01 (×9): qty 2

## 2023-10-01 MED ORDER — GUAIFENESIN ER 600 MG PO TB12
600.0000 mg | ORAL_TABLET | Freq: Two times a day (BID) | ORAL | Status: DC | PRN
Start: 2023-10-01 — End: 2023-10-09

## 2023-10-01 MED ORDER — MORPHINE SULFATE (PF) 2 MG/ML IV SOLN: 2.0000 mg | INTRAVENOUS | Status: DC | PRN

## 2023-10-01 MED ORDER — IOHEXOL 350 MG/ML SOLN
75.0000 mL | Freq: Once | INTRAVENOUS | Status: AC | PRN
  Administered 2023-10-01: 75 mL via INTRAVENOUS

## 2023-10-01 MED ORDER — ONDANSETRON HCL 4 MG PO TABS: 4.0000 mg | ORAL_TABLET | Freq: Four times a day (QID) | ORAL | Status: DC | PRN

## 2023-10-01 MED ORDER — AMLODIPINE BESYLATE 5 MG PO TABS
5.0000 mg | ORAL_TABLET | Freq: Every day | ORAL | Status: DC
  Administered 2023-10-01 – 2023-10-09 (×9): 5 mg via ORAL
  Filled 2023-10-01 (×9): qty 1

## 2023-10-01 MED ORDER — ONDANSETRON HCL 4 MG/2ML IJ SOLN: 4.0000 mg | Freq: Four times a day (QID) | INTRAMUSCULAR | Status: DC | PRN

## 2023-10-01 MED ORDER — SODIUM CHLORIDE 0.9 % IV SOLN
1.0000 g | Freq: Once | INTRAVENOUS | Status: AC
  Administered 2023-10-01: 1 g via INTRAVENOUS
  Filled 2023-10-01: qty 10

## 2023-10-01 MED ORDER — ENOXAPARIN SODIUM 40 MG/0.4ML IJ SOSY
40.0000 mg | PREFILLED_SYRINGE | INTRAMUSCULAR | Status: DC
  Administered 2023-10-01 – 2023-10-09 (×9): 40 mg via SUBCUTANEOUS
  Filled 2023-10-01 (×9): qty 0.4

## 2023-10-01 MED ORDER — ACETAMINOPHEN 325 MG PO TABS
650.0000 mg | ORAL_TABLET | Freq: Four times a day (QID) | ORAL | Status: DC | PRN
  Administered 2023-10-01: 650 mg via ORAL
  Filled 2023-10-01: qty 2

## 2023-10-01 MED ORDER — SODIUM CHLORIDE 0.9 % IV SOLN
1.0000 g | INTRAVENOUS | Status: AC
  Administered 2023-10-02 – 2023-10-06 (×5): 1 g via INTRAVENOUS
  Filled 2023-10-01 (×5): qty 10

## 2023-10-01 NOTE — Assessment & Plan Note (Signed)
Continue quetiapine, risperidone and donepezil Delirium precautions

## 2023-10-01 NOTE — ED Notes (Addendum)
Upon review of chart Pt is from [facility].

## 2023-10-01 NOTE — ED Notes (Signed)
Updated patient's POA Carolyn via phone.

## 2023-10-01 NOTE — Progress Notes (Signed)
Patient was seen and examined in the emergency department.  She is very confused and cannot provide any history.   [name] is an 82 year old woman with medical history significant for anxiety, depression, recurrent UTI, hypertension, dementia, who was brought from the nursing facility to the ED because of unwitnessed fall.  She was found to have multiple rib fractures.  Urinalysis was also abnormal concerning for UTI.  Of note, there are bilateral squamous epithelial cells in the urinary specimen.  Analgesics as needed for pain.  She is on IV ceftriaxone for possible UTI.  Follow-up urine cultures.

## 2023-10-01 NOTE — ED Notes (Signed)
Meal tray at bedside, this RN fed Pt.

## 2023-10-01 NOTE — H&P (Signed)
History and Physical    Patient: Joanna Reed HEN:[id number] DOB: January 26, 1941 DOA: 09/30/2023 DOS: the patient was seen and examined on 10/01/2023 PCP: Doreene Nest, NP  Patient coming from: Home  Chief Complaint:  Chief Complaint  Patient presents with   Fall    HPI: Joanna Reed is a 82 y.o. female with medical history significant for anxiety/depression, recurrent UTI, hypertension, dementia who presents from a nursing facility following an unwitnessed fall.  History limited due to patient's dementia .  She appears comfortable. ED course and dataReview: Vitals within normal limits Labs notable for WBC 15,000, hemoglobin 11.5 which is her baseline and urinalysis consistent with UTI EKG personally viewed and interpreted showing sinus rhythm at 74 Trauma scan including CT head C-spine, L-spine, T-spine and CT chest abdomen and pelvis showing multiple right rib fractures otherwise nonacute as follows: IMPRESSION: CT of the chest: Multiple right rib fractures as described without complicating factors.  CT of the abdomen and pelvis: Diverticular change without diverticulitis. No acute abnormality noted.  Patient given Tylenol for pain started on ceftriaxone Hospitalist consulted for admission for overnight monitoring in view of multiple rib fractures     Past Medical History:  Diagnosis Date   Anxiety and depression    Dementia (HCC)    Past Surgical History:  Procedure Laterality Date   ABDOMINAL HYSTERECTOMY     1980   Social History:  reports that she has never smoked. She has never used smokeless tobacco. She reports that she does not currently use alcohol. She reports that she does not use drugs.  Allergies  Allergen Reactions   Sulfa Antibiotics     Family History  Problem Relation Age of Onset   Arthritis Mother    Heart disease Father        Deceased from MI   Stroke Maternal Grandmother 38   Arthritis Maternal Grandfather     Prior to Admission  medications   Medication Sig Start Date End Date Taking? Authorizing Provider  amLODipine (NORVASC) 5 MG tablet Take 5 mg by mouth daily. 08/23/22   [provider]  Apoaequorin (PREVAGEN) 10 MG CAPS Take 10 mg by mouth daily.    [provider]  Coenzyme Q10 (CO Q-10) 400 MG CAPS Take 400 mg by mouth daily.    [provider]  diclofenac Sodium (VOLTAREN) 1 % GEL Apply 2 g topically 2 (two) times daily. Apply 2 grams topically to left knee twice daily. 03/30/22   [provider]  docusate sodium (COLACE) 100 MG capsule Take 100 mg by mouth daily.    [provider]  donepezil (ARICEPT) 10 MG tablet Take 10 mg by mouth daily. 08/23/22   [provider]  folic acid (FOLVITE) 1 MG tablet Take 1 mg by mouth daily.    [provider]  glycerin adult 2 g suppository Place 1 suppository rectally as needed for constipation. 10/14/22   Pilar Jarvis, MD  LORazepam (ATIVAN) 0.5 MG tablet Take 0.5 tablets (0.25 mg total) by mouth every 12 (twelve) hours as needed for anxiety. 09/19/22   Burnadette Pop, MD  Multiple Vitamin (MULTIVITAMIN WITH MINERALS) TABS tablet Take 1 tablet by mouth daily.    [provider]  QUEtiapine (SEROQUEL) 25 MG tablet Take 1 tablet (25 mg total) by mouth at bedtime. Patient not taking: Reported on 10/14/2022 09/19/22   Burnadette Pop, MD  risperiDONE (RISPERDAL) 0.25 MG tablet Take 0.25 mg by mouth 2 (two) times daily.    [provider]  sertraline (ZOLOFT) 100 MG tablet Take 200 mg by mouth daily. 08/23/22   [provider]    Physical Exam: Vitals:   10/01/23 0030 10/01/23 0100 10/01/23 0130 10/01/23 0200  BP: (!) 143/76 128/70 121/67 131/68  Pulse: 76 72 78 71  Resp:  (!) 21 (!) 21 18  Temp:      TempSrc:      SpO2: 100% 100% 98% 100%  Weight:      Height:       Physical Exam Vitals and nursing note reviewed.  Constitutional:      General: She is not in acute distress. HENT:      Head: Normocephalic and atraumatic.  Cardiovascular:     Rate and Rhythm: Normal rate and regular rhythm.     Heart sounds: Normal heart sounds.  Pulmonary:     Effort: Pulmonary effort is normal.     Breath sounds: Normal breath sounds.  Abdominal:     Palpations: Abdomen is soft.     Tenderness: There is no abdominal tenderness.  Neurological:     General: No focal deficit present.     Mental Status: Mental status is at baseline.     Labs on Admission: I have personally reviewed following labs and imaging studies  CBC: Recent Labs  Lab 10/01/23 0104  WBC 15.3*  NEUTROABS 12.9*  HGB 11.5*  HCT 34.2*  MCV 87.9  PLT 269   Basic Metabolic Panel: Recent Labs  Lab 10/01/23 0104  NA 140  K 3.4*  CL 104  CO2 26  GLUCOSE 129*  BUN 24*  CREATININE 0.62  CALCIUM 8.7*   GFR: Estimated Creatinine Clearance: 38.3 mL/min (by C-G formula based on SCr of 0.62 mg/dL). Liver Function Tests: Recent Labs  Lab 10/01/23 0104  AST 21  ALT 15  ALKPHOS 68  BILITOT 0.5  PROT 6.8  ALBUMIN 4.1   No results for input(s): "LIPASE", "AMYLASE" in the last 168 hours. No results for input(s): "AMMONIA" in the last 168 hours. Coagulation Profile: No results for input(s): "INR", "PROTIME" in the last 168 hours. Cardiac Enzymes: No results for input(s): "CKTOTAL", "CKMB", "CKMBINDEX", "TROPONINI" in the last 168 hours. BNP (last 3 results) No results for input(s): "PROBNP" in the last 8760 hours. HbA1C: No results for input(s): "HGBA1C" in the last 72 hours. CBG: Recent Labs  Lab 10/01/23 0011  GLUCAP 122*   Lipid Profile: No results for input(s): "CHOL", "HDL", "LDLCALC", "TRIG", "CHOLHDL", "LDLDIRECT" in the last 72 hours. Thyroid Function Tests: No results for input(s): "TSH", "T4TOTAL", "FREET4", "T3FREE", "THYROIDAB" in the last 72 hours. Anemia Panel: No results for input(s): "VITAMINB12", "FOLATE", "FERRITIN", "TIBC", "IRON", "RETICCTPCT" in the last 72  hours. Urine analysis:    Component Value Date/Time   COLORURINE YELLOW 10/01/2023 0051   APPEARANCEUR CLEAR 10/01/2023 0051   LABSPEC 1.020 10/01/2023 0051   PHURINE 7.5 10/01/2023 0051   GLUCOSEU NEGATIVE 10/01/2023 0051   HGBUR LARGE (A) 10/01/2023 0051   BILIRUBINUR NEGATIVE 10/01/2023 0051   KETONESUR NEGATIVE 10/01/2023 0051   PROTEINUR 100 (A) 10/01/2023 0051   NITRITE POSITIVE (A) 10/01/2023 0051   LEUKOCYTESUR LARGE (A) 10/01/2023 0051    Radiological Exams on Admission: CT CHEST ABDOMEN PELVIS W CONTRAST  Result Date: 10/01/2023 CLINICAL DATA:  Unwitnessed fall with known rib fractures, initial encounter EXAM: CT CHEST, ABDOMEN, AND PELVIS WITH CONTRAST TECHNIQUE: Multidetector CT imaging of the chest, abdomen and pelvis was performed following the standard protocol during bolus administration of intravenous  contrast. RADIATION DOSE REDUCTION: This exam was performed according to the departmental dose-optimization program which includes automated exposure control, adjustment of the mA and/or kV according to patient size and/or use of iterative reconstruction technique. CONTRAST:  75mL OMNIPAQUE IOHEXOL 350 MG/ML SOLN COMPARISON:  10/14/2022 FINDINGS: CT CHEST FINDINGS Cardiovascular: Atherosclerotic calcifications of the thoracic aorta are noted. No aneurysmal dilatation or dissection is seen. Heart is mildly enlarged in size. Pulmonary artery shows no filling defect to suggest pulmonary embolism. Mediastinum/Nodes: Thoracic inlet is within normal limits. No hilar or mediastinal adenopathy is noted. The esophagus as visualized is within normal limits. Lungs/Pleura: Lungs are well aerated bilaterally. No focal infiltrate or sizable effusion is seen. No pneumothorax is noted. Very mild emphysematous changes are seen. Small right-sided pleural effusion is noted. Musculoskeletal: Right lateral rib fractures are noted involving the sixth through tenth ribs without evidence of underlying  pneumothorax. Degenerative changes of the thoracic spine are seen. No compression deformity is noted. CT ABDOMEN PELVIS FINDINGS Hepatobiliary: Stable hepatic cyst is noted posteriorly in the right lobe of the liver. The gallbladder is within normal limits. Pancreas: Unremarkable. No pancreatic ductal dilatation or surrounding inflammatory changes. Spleen: Normal in size without focal abnormality. Adrenals/Urinary Tract: Adrenal glands are within normal limits. Kidneys demonstrate a normal enhancement pattern bilaterally. Left renal cyst is noted stable from the prior study. No further follow-up is recommended. No obstructive changes are seen. The bladder is decompressed. Stomach/Bowel: Mild diverticular change of the colon is noted without evidence of diverticulitis. The appendix is within normal limits. Small bowel and stomach are unremarkable. Vascular/Lymphatic: Aortic atherosclerosis. No enlarged abdominal or pelvic lymph nodes. Reproductive: Status post hysterectomy. No adnexal masses. Other: No abdominal wall hernia or abnormality. No abdominopelvic ascites. Musculoskeletal: Degenerative changes are noted without acute abnormality. IMPRESSION: CT of the chest: Multiple right rib fractures as described without complicating factors. CT of the abdomen and pelvis: Diverticular change without diverticulitis. No acute abnormality noted. Aortic Atherosclerosis (ICD10-I70.0) and Emphysema (ICD10-J43.9). Electronically Signed   By: Alcide Clever M.D.   On: 10/01/2023 02:09   CT Thoracic Spine Wo Contrast  Result Date: 10/01/2023 CLINICAL DATA:  Trauma EXAM: CT THORACIC SPINE WITHOUT CONTRAST TECHNIQUE: Multidetector CT images of the thoracic were obtained using the standard protocol without intravenous contrast. RADIATION DOSE REDUCTION: This exam was performed according to the departmental dose-optimization program which includes automated exposure control, adjustment of the mA and/or kV according to patient size  and/or use of iterative reconstruction technique. COMPARISON:  None Available. FINDINGS: Alignment: Normal thoracic kyphosis. Vertebrae: No acute fracture or focal pathologic process. Paraspinal and other soft tissues: Thoracic aortic atherosclerosis. Trace right pleural effusion. Nondisplaced right lateral 6 through 8th rib fractures (series 3/images 48, 57, and 67). No pneumothorax. 3.0 cm posterior right hepatic lobe cyst, benign. Disc levels: Mild degenerative changes, most prominent at T12-L1. Spinal canal is patent. IMPRESSION: No traumatic injury to the thoracic spine. Nondisplaced right lateral 6 through 8th rib fractures. Trace right pleural effusion. No pneumothorax. Electronically Signed   By: Charline Bills M.D.   On: 10/01/2023 00:05   CT Lumbar Spine Wo Contrast  Result Date: 10/01/2023 CLINICAL DATA:  Trauma EXAM: CT LUMBAR SPINE WITHOUT CONTRAST TECHNIQUE: Multidetector CT imaging of the lumbar spine was performed without intravenous contrast administration. Multiplanar CT image reconstructions were also generated. RADIATION DOSE REDUCTION: This exam was performed according to the departmental dose-optimization program which includes automated exposure control, adjustment of the mA and/or kV according to patient size and/or use  of iterative reconstruction technique. COMPARISON:  CT abdomen/pelvis dated 10/14/2022 FINDINGS: Segmentation: 5 lumbar type vertebral bodies. Alignment: Normal lumbar lordosis. Vertebrae: No acute fracture or focal pathologic process. Paraspinal and other soft tissues: Trace right pleural effusion. 17 mm simple left upper pole renal cyst (series 4/image 31), benign (Bosniak I). No follow-up is recommended. Posterior right hepatic cyst, incompletely visualized. Vascular calcifications. Disc levels: Mild multilevel degenerative changes, most prominent at T12-L1 and L5-S1. Spinal canal is patent. IMPRESSION: No traumatic injury to the lumbar spine. Mild multilevel  degenerative changes. Electronically Signed   By: Charline Bills M.D.   On: 10/01/2023 00:02   CT HEAD WO CONTRAST ( )  Result Date: 10/01/2023 CLINICAL DATA:  Trauma EXAM: CT HEAD WITHOUT CONTRAST CT CERVICAL SPINE WITHOUT CONTRAST TECHNIQUE: Multidetector CT imaging of the head and cervical spine was performed following the standard protocol without intravenous contrast. Multiplanar CT image reconstructions of the cervical spine were also generated. RADIATION DOSE REDUCTION: This exam was performed according to the departmental dose-optimization program which includes automated exposure control, adjustment of the mA and/or kV according to patient size and/or use of iterative reconstruction technique. COMPARISON:  01/11/2023 FINDINGS: CT HEAD FINDINGS Brain: No evidence of acute infarction, hemorrhage, hydrocephalus, extra-axial collection or mass lesion/mass effect. Global cortical and central atrophy. Secondary ventricular prominence. Subcortical white matter and periventricular small vessel ischemic changes. Vascular: No hyperdense vessel or unexpected calcification. Skull: Normal. Negative for fracture or focal lesion. Sinuses/Orbits: The visualized paranasal sinuses are essentially clear. The mastoid air cells are unopacified. Other: None. CT CERVICAL SPINE FINDINGS Alignment: Normal cervical lordosis. Skull base and vertebrae: No acute fracture. No primary bone lesion or focal pathologic process. Soft tissues and spinal canal: No prevertebral fluid or swelling. No visible canal hematoma. Disc levels: Mild degenerative changes of the mid/lower cervical spine. Spinal canal is patent. Upper chest: Visualized lung apices are clear. Other: Visualized thyroid is unremarkable. IMPRESSION: No acute intracranial abnormality. Atrophy with small vessel ischemic changes. No traumatic injury to the cervical spine. Mild degenerative changes. Electronically Signed   By: Charline Bills M.D.   On: 10/01/2023 00:01    CT Cervical Spine Wo Contrast  Result Date: 10/01/2023 CLINICAL DATA:  Trauma EXAM: CT HEAD WITHOUT CONTRAST CT CERVICAL SPINE WITHOUT CONTRAST TECHNIQUE: Multidetector CT imaging of the head and cervical spine was performed following the standard protocol without intravenous contrast. Multiplanar CT image reconstructions of the cervical spine were also generated. RADIATION DOSE REDUCTION: This exam was performed according to the departmental dose-optimization program which includes automated exposure control, adjustment of the mA and/or kV according to patient size and/or use of iterative reconstruction technique. COMPARISON:  01/11/2023 FINDINGS: CT HEAD FINDINGS Brain: No evidence of acute infarction, hemorrhage, hydrocephalus, extra-axial collection or mass lesion/mass effect. Global cortical and central atrophy. Secondary ventricular prominence. Subcortical white matter and periventricular small vessel ischemic changes. Vascular: No hyperdense vessel or unexpected calcification. Skull: Normal. Negative for fracture or focal lesion. Sinuses/Orbits: The visualized paranasal sinuses are essentially clear. The mastoid air cells are unopacified. Other: None. CT CERVICAL SPINE FINDINGS Alignment: Normal cervical lordosis. Skull base and vertebrae: No acute fracture. No primary bone lesion or focal pathologic process. Soft tissues and spinal canal: No prevertebral fluid or swelling. No visible canal hematoma. Disc levels: Mild degenerative changes of the mid/lower cervical spine. Spinal canal is patent. Upper chest: Visualized lung apices are clear. Other: Visualized thyroid is unremarkable. IMPRESSION: No acute intracranial abnormality. Atrophy with small vessel ischemic changes. No traumatic injury to  the cervical spine. Mild degenerative changes. Electronically Signed   By: Charline Bills M.D.   On: 10/01/2023 00:01     Data Reviewed: Relevant notes from primary care and specialist visits, past  discharge summaries as available in EHR, including Care Everywhere. Prior diagnostic testing as pertinent to current admission diagnoses Updated medications and problem lists for reconciliation ED course, including vitals, labs, imaging, treatment and response to treatment Triage notes, nursing and pharmacy notes and ED provider's notes Notable results as noted in HPI   Assessment and Plan: * Multiple closed fractures of ribs of right side Unwitnessed fall Pain control Monitor for hypoxia Incentive spirometer PT OT eval  Urinary tract infection Rocephin Follow cultures  Dementia without behavioral disturbance (HCC) Continue quetiapine, risperidone and donepezil Delirium precautions  Anxiety and depression Continue sertraline     DVT prophylaxis: Lovenox  Consults: none  Advance Care Planning:   Code Status: Prior   Family Communication: none  Disposition Plan: Back to previous home environment  Severity of Illness: The appropriate patient status for this patient is OBSERVATION. Observation status is judged to be reasonable and necessary in order to provide the required intensity of service to ensure the patient's safety. The patient's presenting symptoms, physical exam findings, and initial radiographic and laboratory data in the context of their medical condition is felt to place them at decreased risk for further clinical deterioration. Furthermore, it is anticipated that the patient will be medically stable for discharge from the hospital within 2 midnights of admission.   Author: Andris Baumann, MD 10/01/2023 2:39 AM  For on call review [email or web address].

## 2023-10-01 NOTE — ED Notes (Signed)
Pt at bedside

## 2023-10-01 NOTE — Evaluation (Signed)
Occupational Therapy Evaluation Patient Details Name: Joanna Reed MRN: [id number] DOB: [birth year] Today's Date: 10/01/2023   History of Present Illness Pt is an 82 y.o. F with medical history significant for anxiety/depression, recurrent UTI, HTN, and dementia who presents to ED from facility following an unwitnessed fall, resulting in closed fractures of R ribs 6, 7, 8.   Clinical Impression   Pt was seen for PT/OT evaluation this date to maximize pt/therapist safety d/t dementia. Prior to hospital admission, pt resides in memory care at [facility]. She ambulated with RW and had assist for transfers. Fed self with hands, unsure of assist levels for ADLs as pt unable to provide information and POA/friend unsure.  Pt presents to acute OT demonstrating impaired ADL performance and functional mobility 2/2 confusion, pain, weakness, balance deficits (See OT problem list for additional functional deficits). Pt currently requires Max A x2 for all bed mobility with multimodal cueing for sequencing/hand placement, etc. Max A for initial seated balance progressing to Min A d/t R posterior lean. Mod a X2 for STS from high gurney with notable posterior lean and fear of falling. Lateral steps taken requiring Max/Mod A x2 with management of RW, cueing and assist for initiation. Pt noted to be soiled requiring linen and brief change plus hygiene provided with total assist. Pt able to state name, otherwise garbled/rambling speech. Pt would benefit from skilled OT services to address noted impairments and functional limitations (see below for any additional details) in order to maximize safety and independence while minimizing falls risk and caregiver burden. Do anticipate the need for follow up OT services upon acute hospital DC.        If plan is discharge home, recommend the following: Two people to help with walking and/or transfers;Assist for transportation;Help with stairs or ramp for entrance;A lot of  help with bathing/dressing/bathroom;Supervision due to cognitive status;Direct supervision/assist for medications management;Direct supervision/assist for financial management    Functional Status Assessment  Patient has had a recent decline in their functional status and demonstrates the ability to make significant improvements in function in a reasonable and predictable amount of time.  Equipment Recommendations  None recommended by OT    Recommendations for Other Services       Precautions / Restrictions Precautions Precautions: Fall Precaution Comments: closed R rib fractures 6, 7, 8 Restrictions Weight Bearing Restrictions: No      Mobility Bed Mobility Overal bed mobility: Needs Assistance Bed Mobility: Supine to Sit, Sit to Supine     Supine to sit: Max assist, +2 for physical assistance, HOB elevated Sit to supine: Max assist, +2 for physical assistance   General bed mobility comments: Max A x2 for all bed mobility d/t cognition requiring multimodal cueing although pt able to initiate small movements    Transfers Overall transfer level: Needs assistance Equipment used: Rolling walker (2 wheels) Transfers: Sit to/from Stand Sit to Stand: From elevated surface, Mod assist, +2 physical assistance           General transfer comment: Mod A x2 for STS and lateral steps to [facility] with posterior lean/fear of falling and multimodal cues; knee flexion noted in standing      Balance Overall balance assessment: Needs assistance Sitting-balance support: Feet supported, Bilateral upper extremity supported Sitting balance-Leahy Scale: Fair Sitting balance - Comments: initial max A progressing to min A after ~30 sec for seated balance d/t intial R posterior lean Postural control: Posterior lean   Standing balance-Leahy Scale: Poor Standing balance comment: Modx2 and  use of RW with posterior lean                           ADL either performed or assessed with  clinical judgement   ADL Overall ADL's : Needs assistance/impaired                             Toileting- Clothing Manipulation and Hygiene: Maximal assistance;Total assistance;Sit to/from stand         General ADL Comments: hand feeder at baseline; likely assist with ADLs from memory care staff at ALF     Vision         Perception         Praxis         Pertinent Vitals/Pain Pain Assessment Pain Assessment: Faces Faces Pain Scale: Hurts little more Pain Location: R ribs Pain Descriptors / Indicators: Discomfort, Sore Pain Intervention(s): Monitored during session     Extremity/Trunk Assessment Upper Extremity Assessment Upper Extremity Assessment: Generalized weakness   Lower Extremity Assessment Lower Extremity Assessment: Generalized weakness   Cervical / Trunk Assessment Cervical / Trunk Assessment: Kyphotic   Communication Communication Communication: Difficulty following commands/understanding;Difficulty communicating thoughts/reduced clarity of speech (rambling, non-coherent speech majority of time; occasional sensible response to questions) Following commands: Follows one step commands inconsistently Cueing Techniques: Verbal cues;Gestural cues;Visual cues;Tactile cues   Cognition Arousal: Alert Behavior During Therapy: Restless, Anxious Overall Cognitive Status: No family/caregiver present to determine baseline cognitive functioning Area of Impairment: Orientation, Awareness, Following commands                 Orientation Level: Person             General Comments: dementia and recurrent UTIs; per POA/friend, pt with improved awareness/cognition at baseline     General Comments       Exercises     Shoulder Instructions      Home Living Family/patient expects to be discharged to:: Assisted living                             Home Equipment: Rolling Walker (2 wheels) (Unknown)   Additional Comments: Per  POA/friend, pt lives in memory care side of [facility]      Prior Functioning/Environment Prior Level of Function : Patient poor historian/Family not available;Needs assist (Unknown)             Mobility Comments: per POA/friend, baseline of ambulating in facility with RW, 3 weeks ago pt was able to exit facility with this friend with assist for transfers ADLs Comments: per POA/friend, pt often eating with hands 2/2 forgetting to use silverware, unsure of other ADL needs        OT Problem List: Decreased strength;Pain;Decreased cognition;Decreased safety awareness;Impaired balance (sitting and/or standing)      OT Treatment/Interventions: Self-care/ADL training;Therapeutic exercise;Therapeutic activities;DME and/or AE instruction;Patient/family education;Balance training    OT Goals(Current goals can be found in the care plan section) Acute Rehab OT Goals Patient Stated Goal: improve strength, balance and cognition OT Goal Formulation: With patient Time For Goal Achievement: 10/15/23 Potential to Achieve Goals: Good ADL Goals Pt Will Perform Grooming: with supervision;sitting Pt Will Perform Upper Body Bathing: sitting;with supervision Pt Will Perform Upper Body Dressing: with supervision;sitting Pt Will Transfer to Toilet: with min assist;bedside commode;ambulating  OT Frequency: Min 1X/week    Co-evaluation PT/OT/SLP Co-Evaluation/Treatment: Yes  Reason for Co-Treatment: Necessary to address cognition/behavior during functional activity;To address functional/ADL transfers PT goals addressed during session: Mobility/safety with mobility OT goals addressed during session: ADL's and self-care      AM-PAC OT "6 Clicks" Daily Activity     Outcome Measure Help from another person eating meals?: A Lot Help from another person taking care of personal grooming?: A Lot Help from another person toileting, which includes using toliet, bedpan, or urinal?: Total Help from another  person bathing (including washing, rinsing, drying)?: Total Help from another person to put on and taking off regular upper body clothing?: A Lot Help from another person to put on and taking off regular lower body clothing?: Total 6 Click Score: 9   End of Session Equipment Utilized During Treatment: Rolling walker (2 wheels)  Activity Tolerance: Patient tolerated treatment well Patient left: in bed;with call bell/phone within reach;with bed alarm set  OT Visit Diagnosis: Other abnormalities of gait and mobility (R26.89);Unsteadiness on feet (R26.81);Muscle weakness (generalized) (M62.81);History of falling (Z91.81)                Time: 1610-9604 OT Time Calculation (min): 23 min Charges:  OT General Charges $OT Visit: 1 Visit OT Evaluation $OT Eval Moderate Complexity: 1 Mod  Mateus Rewerts, OTR/L 10/01/23, 2:09 PM  Curt Oatis E Inioluwa Baris 10/01/2023, 2:05 PM

## 2023-10-01 NOTE — Assessment & Plan Note (Signed)
Unwitnessed fall Pain control Monitor for hypoxia Incentive spirometer PT OT eval

## 2023-10-01 NOTE — Assessment & Plan Note (Addendum)
Rocephin ?Follow cultures ?

## 2023-10-01 NOTE — Assessment & Plan Note (Signed)
Continue sertraline 

## 2023-10-01 NOTE — Evaluation (Signed)
Physical Therapy Evaluation Patient Details Name: Lizanne Rei MRN: [id number] DOB: 10/18/1941 Today's Date: 10/01/2023  History of Present Illness  Pt is an 82 y.o. F with medical history significant for anxiety/depression, recurrent UTI, HTN, and dementia who presents to ED from facility following an unwitnessed fall, resulting in closed fractures of R ribs 6, 7, 8.  Clinical Impression  Pt received in bed, A,O only to name, with rambling, confused speech throughout session and unable to give history. Pt struggled with bed mobility, needing +2 max A to complete 2/2 decreased cognition. Pt completed STS from elevated bed with +2 mod A and use of RW, significantly aided by gravity, and unable to stand upright due to posterior lean not correcting despite multimodal cuing. With +2 mod-max assist, pt made small, shuffling side-steps toward HOB, limited by constant posterior lean and decreased awareness. Pt's BP, HR, and SpO2 were monitored during session and remained WNL, pt seemingly reporting adverse symptoms of rib pain and mild lightheadedness though not orthostatic. Pt will benefit from continued PT services upon discharge to safely address deficits listed in patient problem list for decreased caregiver assistance and eventual return to PLOF.        If plan is discharge home, recommend the following: A lot of help with walking and/or transfers;A lot of help with bathing/dressing/bathroom;Assistance with cooking/housework;Assistance with feeding;Direct supervision/assist for medications management;Direct supervision/assist for financial management;Help with stairs or ramp for entrance;Assist for transportation;Supervision due to cognitive status   Can travel by private vehicle        Equipment Recommendations None recommended by PT  Recommendations for Other Services       Functional Status Assessment Patient has had a recent decline in their functional status and/or demonstrates limited  ability to make significant improvements in function in a reasonable and predictable amount of time     Precautions / Restrictions Precautions Precautions: Fall Precaution Comments: closed R rib fractures 6, 7, 8 Restrictions Weight Bearing Restrictions: No      Mobility  Bed Mobility Overal bed mobility: Needs Assistance Bed Mobility: Supine to Sit, Sit to Supine     Supine to sit: Max assist, +2 for physical assistance, HOB elevated Sit to supine: Max assist, +2 for physical assistance   General bed mobility comments: able to initiate small movements with BUEs and BLEs, but unable to complete without heavy assist 2/2 decreased cognition, multimodal cuing given for sequencing and BLE placement, heavy encouragement given    Transfers Overall transfer level: Needs assistance Equipment used: Rolling walker (2 wheels) Transfers: Sit to/from Stand Sit to Stand: From elevated surface, Mod assist, +2 physical assistance           General transfer comment: gravity-assisted 2/2 elevated ED bed and pt height, self-selecting to grip RW with BUEs to perform, mod unsteadiness with inability to stand upright despite physical assist and cuing, able to bear weight through BLEs but never fully extending bilat knees, exhibiting consistent posterior lean throughout sitting and standing    Ambulation/Gait Ambulation/Gait assistance: Mod assist, Max assist, +2 physical assistance Gait Distance (Feet): 1 Feet Assistive device: Rolling walker (2 wheels) Gait Pattern/deviations: Leaning posteriorly, Shuffle, Trunk flexed Gait velocity: very decreased     General Gait Details: small, inconsistent side-steps toward HOB, pt with notable posterior lean throughout despite physical assist at trunk/hips and multimodal cuing to correct, bearing some weight through BLEs but significantly decreased BLE confidence relying on RW and +2 assist, exhibiting fearful behavior, heavy assist under bilat arms to  aid and encourage stepping  Stairs            Wheelchair Mobility     Tilt Bed    Modified Rankin (Stroke Patients Only)       Balance Overall balance assessment: Needs assistance Sitting-balance support: Feet supported, Bilateral upper extremity supported Sitting balance-Leahy Scale: Fair Sitting balance - Comments: initial max A to static sit, improving to min A after ~30 sec, pt unsteady throughout and unable to correct lean Postural control: Posterior lean   Standing balance-Leahy Scale: Poor Standing balance comment: unable to static stand without mod+2 assist, even with RW, strong lean throughout                             Pertinent Vitals/Pain Pain Assessment Pain Assessment: Faces Faces Pain Scale: Hurts little more Pain Location: R ribs Pain Descriptors / Indicators: Discomfort, Sore Pain Intervention(s): Monitored during session, Repositioned    Home Living Family/patient expects to be discharged to:: Assisted living                 Home Equipment:  (Unknown) Additional Comments: Per POA/friend, pt lives in memory care side of [facility]    Prior Function Prior Level of Function : Patient poor historian/Family not available;Needs assist (Unknown)             Mobility Comments: per POA/friend, baseline of ambulating in facility with RW, 3 weeks ago pt was able to exit facility with this friend with assist for transfers ADLs Comments: per POA/friend, pt often eating with hands 2/2 forgetting to use silverware, unsure of other ADL needs     Extremity/Trunk Assessment   Upper Extremity Assessment Upper Extremity Assessment: Defer to OT evaluation;Generalized weakness    Lower Extremity Assessment Lower Extremity Assessment: Generalized weakness    Cervical / Trunk Assessment Cervical / Trunk Assessment: Kyphotic  Communication   Communication Communication: Difficulty following commands/understanding;Difficulty  communicating thoughts/reduced clarity of speech (rambling, non-coherent speech majority of time; occasional sensible response to questions) Cueing Techniques: Verbal cues;Gestural cues;Tactile cues;Visual cues  Cognition Arousal: Alert Behavior During Therapy: Restless, Anxious Overall Cognitive Status: No family/caregiver present to determine baseline cognitive functioning Area of Impairment: Orientation, Awareness, Following commands                 Orientation Level: Person             General Comments: dementia and recurrent UTIs; per POA/friend, pt with improved awareness/cognition at baseline        General Comments      Exercises     Assessment/Plan    PT Assessment Patient needs continued PT services  PT Problem List Decreased strength;Decreased mobility;Decreased range of motion;Decreased coordination;Decreased cognition;Decreased activity tolerance;Decreased balance;Decreased knowledge of use of DME;Pain;Decreased safety awareness       PT Treatment Interventions DME instruction;Therapeutic exercise;Balance training;Gait training;Stair training;Functional mobility training;Therapeutic activities;Patient/family education;Neuromuscular re-education;Cognitive remediation    PT Goals (Current goals can be found in the Care Plan section)  Acute Rehab PT Goals PT Goal Formulation: Patient unable to participate in goal setting    Frequency Min 1X/week     Co-evaluation PT/OT/SLP Co-Evaluation/Treatment: Yes Reason for Co-Treatment: Necessary to address cognition/behavior during functional activity;To address functional/ADL transfers PT goals addressed during session: Mobility/safety with mobility OT goals addressed during session: ADL's and self-care       AM-PAC PT "6 Clicks" Mobility  Outcome Measure Help needed turning from your back to  your side while in a flat bed without using bedrails?: A Little Help needed moving from lying on your back to  sitting on the side of a flat bed without using bedrails?: A Lot Help needed moving to and from a bed to a chair (including a wheelchair)?: A Lot Help needed standing up from a chair using your arms (e.g., wheelchair or bedside chair)?: A Lot Help needed to walk in hospital room?: A Lot Help needed climbing 3-5 steps with a railing? : Total 6 Click Score: 12    End of Session   Activity Tolerance: Other (comment) (Limited by cognition and weakness) Patient left: in bed;with call bell/phone within reach;with chair alarm set Nurse Communication: Mobility status PT Visit Diagnosis: Muscle weakness (generalized) (M62.81);Unsteadiness on feet (R26.81);Pain Pain - Right/Left: Right Pain - part of body:  (ribs)    Time: 1610-9604 PT Time Calculation (min) (ACUTE ONLY): 19 min   Charges:               Rosiland Oz SPT 10/01/23, 12:33 PM

## 2023-10-02 ENCOUNTER — Encounter: Payer: Self-pay | Admitting: Internal Medicine

## 2023-10-02 DIAGNOSIS — S2241XA Multiple fractures of ribs, right side, initial encounter for closed fracture: Secondary | ICD-10-CM | POA: Diagnosis not present

## 2023-10-02 LAB — CBC WITH DIFFERENTIAL/PLATELET
Abs Immature Granulocytes: 0.03 10*3/uL (ref 0.00–0.07)
Basophils Absolute: 0 10*3/uL (ref 0.0–0.1)
Basophils Relative: 1 %
Eosinophils Absolute: 0.1 10*3/uL (ref 0.0–0.5)
Eosinophils Relative: 1 %
HCT: 35.2 % — ABNORMAL LOW (ref 36.0–46.0)
Hemoglobin: 11.6 g/dL — ABNORMAL LOW (ref 12.0–15.0)
Immature Granulocytes: 0 %
Lymphocytes Relative: 17 %
Lymphs Abs: 1.2 10*3/uL (ref 0.7–4.0)
MCH: 29.6 pg (ref 26.0–34.0)
MCHC: 33 g/dL (ref 30.0–36.0)
MCV: 89.8 fL (ref 80.0–100.0)
Monocytes Absolute: 0.6 10*3/uL (ref 0.1–1.0)
Monocytes Relative: 9 %
Neutro Abs: 4.9 10*3/uL (ref 1.7–7.7)
Neutrophils Relative %: 72 %
Platelets: 240 10*3/uL (ref 150–400)
RBC: 3.92 MIL/uL (ref 3.87–5.11)
RDW: 12.5 % (ref 11.5–15.5)
WBC: 6.9 10*3/uL (ref 4.0–10.5)
nRBC: 0 % (ref 0.0–0.2)

## 2023-10-02 LAB — BASIC METABOLIC PANEL
Anion gap: 9 (ref 5–15)
BUN: 16 mg/dL (ref 8–23)
CO2: 22 mmol/L (ref 22–32)
Calcium: 8.1 mg/dL — ABNORMAL LOW (ref 8.9–10.3)
Chloride: 108 mmol/L (ref 98–111)
Creatinine, Ser: 0.61 mg/dL (ref 0.44–1.00)
GFR, Estimated: >60 mL/min (ref >60)
Glucose, Bld: 91 mg/dL (ref 70–99)
Potassium: 3.2 mmol/L — ABNORMAL LOW (ref 3.5–5.1)
Sodium: 139 mmol/L (ref 135–145)

## 2023-10-02 MED ORDER — POTASSIUM CHLORIDE CRYS ER 20 MEQ PO TBCR
40.0000 meq | EXTENDED_RELEASE_TABLET | ORAL | Status: AC
  Administered 2023-10-02 (×2): 40 meq via ORAL
  Filled 2023-10-02 (×2): qty 2

## 2023-10-02 NOTE — ED Notes (Signed)
Update given on patient to [facility], pt's friend/emergency contact.

## 2023-10-02 NOTE — TOC Progression Note (Signed)
Transition of Care [facility]) - Progression Note    Patient Details  Name: Mailani Stormes MRN: [id number] Date of Birth: [birth year]  Transition of Care [facility]) CM/SW Contact  Marlowe Sax, RN Phone Number: 10/02/2023, 3:14 PM  Clinical Narrative:      Reached out to [facility] Memory care and left a VM asking for a call back      Expected Discharge Plan and Services                                               Social Determinants of Health (SDOH) Interventions SDOH Screenings   Food Insecurity: Patient Unable To Answer (10/02/2023)  Housing: High Risk (10/02/2023)  Transportation Needs: Patient Unable To Answer (10/02/2023)  Utilities: Patient Unable To Answer (10/02/2023)  Tobacco Use: Low Risk  (01/11/2023)    Readmission Risk Interventions     No data to display

## 2023-10-02 NOTE — ED Notes (Signed)
Patient fed breakfast tray by this RN. Patient ate approx 50% of tray.

## 2023-10-02 NOTE — Progress Notes (Addendum)
Progress Note    Joanna Reed  ZOX:[id number] DOB: 06-30-1941  DOA: 09/30/2023 PCP: Doreene Nest, NP      Brief Narrative:    Medical records reviewed and are as summarized below:  Joanna Reed is a 82 y.o. female  with medical history significant for anxiety/depression, recurrent UTI, hypertension, dementia, who presented to the hospital from the nursing facility because of an unwitnessed fall. She was found to have multiple right-sided rib fractures and urinalysis was concerning for urinary tract infection.      Assessment/Plan:   Principal Problem:   Multiple closed fractures of ribs of right side Active Problems:   Unwitnessed fall   Urinary tract infection   Anxiety and depression   Dementia without behavioral disturbance (HCC)   Body mass index is 21.53 kg/m.   Multiple closed fractures of right-sided ribs (6 through 10) s/p fall: Analgesics as needed for pain.   Acute UTI: Urine culture showed Proteus mirabilis.  Follow-up urine culture sensitivity report.  Continue IV ceftriaxone.   Hypokalemia: Replete potassium and monitor levels.   Anxiety, depression, dementia: Continue psychotropics     Diet Order             Diet Heart Room service appropriate? No; Fluid consistency: Thin  Diet effective now                            Consultants: None  Procedures: None    Medications:    amLODipine  5 mg Oral Daily   donepezil  10 mg Oral Daily   enoxaparin (LOVENOX) injection  40 mg Subcutaneous Q24H   potassium chloride  40 mEq Oral Q4H   risperiDONE  0.25 mg Oral BID   sertraline  200 mg Oral Daily   Continuous Infusions:  cefTRIAXone (ROCEPHIN)  IV Stopped (10/02/23 0412)     Anti-infectives (From admission, onward)    Start     Dose/Rate Route Frequency Ordered Stop   10/02/23 0200  cefTRIAXone (ROCEPHIN) 1 g in sodium chloride 0.9 % 100 mL IVPB        1 g 200 mL/hr over 30 Minutes Intravenous Every  24 hours 10/01/23 0310     10/01/23 0145  cefTRIAXone (ROCEPHIN) 1 g in sodium chloride 0.9 % 100 mL IVPB        1 g 200 mL/hr over 30 Minutes Intravenous  Once 10/01/23 0132 10/01/23 0230              Family Communication/Anticipated D/C date and plan/Code Status   DVT prophylaxis: enoxaparin (LOVENOX) injection 40 mg Start: 10/01/23 0800     Code Status: Full Code  Family Communication: None. I called Ms. Carolyn McLamb, on 10/02/23 at 4:26 pm but there was no response, and her voicemail box is full. Disposition Plan: Plan to discharge to   Status is: Observation The patient will require care spanning > 2 midnights and should be moved to inpatient because: Acute UTI       Subjective:   Interval events noted.  She is confused and cannot provide any history.  Objective:    Vitals:   10/02/23 0400 10/02/23 0530 10/02/23 0735 10/02/23 0830  BP: (!) 143/66 (!) 140/63 133/70 128/70  Pulse: 71  71 78  Resp: 16  18 18   Temp:   98.3 F (36.8 C)   TempSrc:   Oral   SpO2: 98%  100% 99%  Weight:  Height:       No data found.  No intake or output data in the 24 hours ending 10/02/23 0857 Filed Weights   09/30/23 2243  Weight: 50 kg    Exam:  GEN: NAD SKIN: Warm and dry EYES: No pallor or icterus ENT: MMM CV: RRR PULM: CTA B ABD: soft, ND, NT, +BS CNS: Alert and oriented to person only, non focal EXT: No edema or tenderness      Data Reviewed:   I have personally reviewed following labs and imaging studies:  Labs: Labs show the following:   Basic Metabolic Panel: Recent Labs  Lab 10/01/23 0104 10/02/23 0429  NA 140 139  K 3.4* 3.2*  CL 104 108  CO2 26 22  GLUCOSE 129* 91  BUN 24* 16  CREATININE 0.62 0.61  CALCIUM 8.7* 8.1*  MG 2.1  --    GFR Estimated Creatinine Clearance: 38.3 mL/min (by C-G formula based on SCr of 0.61 mg/dL). Liver Function Tests: Recent Labs  Lab 10/01/23 0104  AST 21  ALT 15  ALKPHOS 68  BILITOT  0.5  PROT 6.8  ALBUMIN 4.1   No results for input(s): "LIPASE", "AMYLASE" in the last 168 hours. No results for input(s): "AMMONIA" in the last 168 hours. Coagulation profile No results for input(s): "INR", "PROTIME" in the last 168 hours.  CBC: Recent Labs  Lab 10/01/23 0104 10/02/23 0429  WBC 15.3* 6.9  NEUTROABS 12.9* 4.9  HGB 11.5* 11.6*  HCT 34.2* 35.2*  MCV 87.9 89.8  PLT 269 240   Cardiac Enzymes: No results for input(s): "CKTOTAL", "CKMB", "CKMBINDEX", "TROPONINI" in the last 168 hours. BNP (last 3 results) No results for input(s): "PROBNP" in the last 8760 hours. CBG: Recent Labs  Lab 10/01/23 0011  GLUCAP 122*   D-Dimer: No results for input(s): "DDIMER" in the last 72 hours. Hgb A1c: No results for input(s): "HGBA1C" in the last 72 hours. Lipid Profile: No results for input(s): "CHOL", "HDL", "LDLCALC", "TRIG", "CHOLHDL", "LDLDIRECT" in the last 72 hours. Thyroid function studies: No results for input(s): "TSH", "T4TOTAL", "T3FREE", "THYROIDAB" in the last 72 hours.  Invalid input(s): "FREET3" Anemia work up: No results for input(s): "VITAMINB12", "FOLATE", "FERRITIN", "TIBC", "IRON", "RETICCTPCT" in the last 72 hours. Sepsis Labs: Recent Labs  Lab 10/01/23 0104 10/02/23 0429  WBC 15.3* 6.9    Microbiology Recent Results (from the past 240 hour(s))  Urine Culture     Status: Abnormal (Preliminary result)   Collection Time: 10/01/23 12:51 AM   Specimen: Urine, Catheterized  Result Value Ref Range Status   Specimen Description   Final    URINE, CATHETERIZED Performed at [facility], [address], [place], [place] [address]    Special Requests   Final    NONE Performed at [facility], [address] [place], [place] [address]    Culture 80,000 COLONIES/mL GRAM NEGATIVE RODS (A)  Final   Report Status PENDING  Incomplete    Procedures and diagnostic studies:  CT CHEST ABDOMEN PELVIS W CONTRAST  Result Date:  10/01/2023 CLINICAL DATA:  Unwitnessed fall with known rib fractures, initial encounter EXAM: CT CHEST, ABDOMEN, AND PELVIS WITH CONTRAST TECHNIQUE: Multidetector CT imaging of the chest, abdomen and pelvis was performed following the standard protocol during bolus administration of intravenous contrast. RADIATION DOSE REDUCTION: This exam was performed according to the departmental dose-optimization program which includes automated exposure control, adjustment of the mA and/or kV according to patient size and/or use of iterative reconstruction  technique. CONTRAST:  75mL OMNIPAQUE IOHEXOL 350 MG/ML SOLN COMPARISON:  10/14/2022 FINDINGS: CT CHEST FINDINGS Cardiovascular: Atherosclerotic calcifications of the thoracic aorta are noted. No aneurysmal dilatation or dissection is seen. Heart is mildly enlarged in size. Pulmonary artery shows no filling defect to suggest pulmonary embolism. Mediastinum/Nodes: Thoracic inlet is within normal limits. No hilar or mediastinal adenopathy is noted. The esophagus as visualized is within normal limits. Lungs/Pleura: Lungs are well aerated bilaterally. No focal infiltrate or sizable effusion is seen. No pneumothorax is noted. Very mild emphysematous changes are seen. Small right-sided pleural effusion is noted. Musculoskeletal: Right lateral rib fractures are noted involving the sixth through tenth ribs without evidence of underlying pneumothorax. Degenerative changes of the thoracic spine are seen. No compression deformity is noted. CT ABDOMEN PELVIS FINDINGS Hepatobiliary: Stable hepatic cyst is noted posteriorly in the right lobe of the liver. The gallbladder is within normal limits. Pancreas: Unremarkable. No pancreatic ductal dilatation or surrounding inflammatory changes. Spleen: Normal in size without focal abnormality. Adrenals/Urinary Tract: Adrenal glands are within normal limits. Kidneys demonstrate a normal enhancement pattern bilaterally. Left renal cyst is noted  stable from the prior study. No further follow-up is recommended. No obstructive changes are seen. The bladder is decompressed. Stomach/Bowel: Mild diverticular change of the colon is noted without evidence of diverticulitis. The appendix is within normal limits. Small bowel and stomach are unremarkable. Vascular/Lymphatic: Aortic atherosclerosis. No enlarged abdominal or pelvic lymph nodes. Reproductive: Status post hysterectomy. No adnexal masses. Other: No abdominal wall hernia or abnormality. No abdominopelvic ascites. Musculoskeletal: Degenerative changes are noted without acute abnormality. IMPRESSION: CT of the chest: Multiple right rib fractures as described without complicating factors. CT of the abdomen and pelvis: Diverticular change without diverticulitis. No acute abnormality noted. Aortic Atherosclerosis (ICD10-I70.0) and Emphysema (ICD10-J43.9). Electronically Signed   By: Alcide Clever M.D.   On: 10/01/2023 02:09   CT Thoracic Spine Wo Contrast  Result Date: 10/01/2023 CLINICAL DATA:  Trauma EXAM: CT THORACIC SPINE WITHOUT CONTRAST TECHNIQUE: Multidetector CT images of the thoracic were obtained using the standard protocol without intravenous contrast. RADIATION DOSE REDUCTION: This exam was performed according to the departmental dose-optimization program which includes automated exposure control, adjustment of the mA and/or kV according to patient size and/or use of iterative reconstruction technique. COMPARISON:  None Available. FINDINGS: Alignment: Normal thoracic kyphosis. Vertebrae: No acute fracture or focal pathologic process. Paraspinal and other soft tissues: Thoracic aortic atherosclerosis. Trace right pleural effusion. Nondisplaced right lateral 6 through 8th rib fractures (series 3/images 48, 57, and 67). No pneumothorax. 3.0 cm posterior right hepatic lobe cyst, benign. Disc levels: Mild degenerative changes, most prominent at T12-L1. Spinal canal is patent. IMPRESSION: No traumatic  injury to the thoracic spine. Nondisplaced right lateral 6 through 8th rib fractures. Trace right pleural effusion. No pneumothorax. Electronically Signed   By: Charline Bills M.D.   On: 10/01/2023 00:05   CT Lumbar Spine Wo Contrast  Result Date: 10/01/2023 CLINICAL DATA:  Trauma EXAM: CT LUMBAR SPINE WITHOUT CONTRAST TECHNIQUE: Multidetector CT imaging of the lumbar spine was performed without intravenous contrast administration. Multiplanar CT image reconstructions were also generated. RADIATION DOSE REDUCTION: This exam was performed according to the departmental dose-optimization program which includes automated exposure control, adjustment of the mA and/or kV according to patient size and/or use of iterative reconstruction technique. COMPARISON:  CT abdomen/pelvis dated 10/14/2022 FINDINGS: Segmentation: 5 lumbar type vertebral bodies. Alignment: Normal lumbar lordosis. Vertebrae: No acute fracture or focal pathologic process. Paraspinal and other soft tissues:  Trace right pleural effusion. 17 mm simple left upper pole renal cyst (series 4/image 31), benign (Bosniak I). No follow-up is recommended. Posterior right hepatic cyst, incompletely visualized. Vascular calcifications. Disc levels: Mild multilevel degenerative changes, most prominent at T12-L1 and L5-S1. Spinal canal is patent. IMPRESSION: No traumatic injury to the lumbar spine. Mild multilevel degenerative changes. Electronically Signed   By: Charline Bills M.D.   On: 10/01/2023 00:02   CT HEAD WO CONTRAST ( )  Result Date: 10/01/2023 CLINICAL DATA:  Trauma EXAM: CT HEAD WITHOUT CONTRAST CT CERVICAL SPINE WITHOUT CONTRAST TECHNIQUE: Multidetector CT imaging of the head and cervical spine was performed following the standard protocol without intravenous contrast. Multiplanar CT image reconstructions of the cervical spine were also generated. RADIATION DOSE REDUCTION: This exam was performed according to the departmental  dose-optimization program which includes automated exposure control, adjustment of the mA and/or kV according to patient size and/or use of iterative reconstruction technique. COMPARISON:  01/11/2023 FINDINGS: CT HEAD FINDINGS Brain: No evidence of acute infarction, hemorrhage, hydrocephalus, extra-axial collection or mass lesion/mass effect. Global cortical and central atrophy. Secondary ventricular prominence. Subcortical white matter and periventricular small vessel ischemic changes. Vascular: No hyperdense vessel or unexpected calcification. Skull: Normal. Negative for fracture or focal lesion. Sinuses/Orbits: The visualized paranasal sinuses are essentially clear. The mastoid air cells are unopacified. Other: None. CT CERVICAL SPINE FINDINGS Alignment: Normal cervical lordosis. Skull base and vertebrae: No acute fracture. No primary bone lesion or focal pathologic process. Soft tissues and spinal canal: No prevertebral fluid or swelling. No visible canal hematoma. Disc levels: Mild degenerative changes of the mid/lower cervical spine. Spinal canal is patent. Upper chest: Visualized lung apices are clear. Other: Visualized thyroid is unremarkable. IMPRESSION: No acute intracranial abnormality. Atrophy with small vessel ischemic changes. No traumatic injury to the cervical spine. Mild degenerative changes. Electronically Signed   By: Charline Bills M.D.   On: 10/01/2023 00:01   CT Cervical Spine Wo Contrast  Result Date: 10/01/2023 CLINICAL DATA:  Trauma EXAM: CT HEAD WITHOUT CONTRAST CT CERVICAL SPINE WITHOUT CONTRAST TECHNIQUE: Multidetector CT imaging of the head and cervical spine was performed following the standard protocol without intravenous contrast. Multiplanar CT image reconstructions of the cervical spine were also generated. RADIATION DOSE REDUCTION: This exam was performed according to the departmental dose-optimization program which includes automated exposure control, adjustment of the mA  and/or kV according to patient size and/or use of iterative reconstruction technique. COMPARISON:  01/11/2023 FINDINGS: CT HEAD FINDINGS Brain: No evidence of acute infarction, hemorrhage, hydrocephalus, extra-axial collection or mass lesion/mass effect. Global cortical and central atrophy. Secondary ventricular prominence. Subcortical white matter and periventricular small vessel ischemic changes. Vascular: No hyperdense vessel or unexpected calcification. Skull: Normal. Negative for fracture or focal lesion. Sinuses/Orbits: The visualized paranasal sinuses are essentially clear. The mastoid air cells are unopacified. Other: None. CT CERVICAL SPINE FINDINGS Alignment: Normal cervical lordosis. Skull base and vertebrae: No acute fracture. No primary bone lesion or focal pathologic process. Soft tissues and spinal canal: No prevertebral fluid or swelling. No visible canal hematoma. Disc levels: Mild degenerative changes of the mid/lower cervical spine. Spinal canal is patent. Upper chest: Visualized lung apices are clear. Other: Visualized thyroid is unremarkable. IMPRESSION: No acute intracranial abnormality. Atrophy with small vessel ischemic changes. No traumatic injury to the cervical spine. Mild degenerative changes. Electronically Signed   By: Charline Bills M.D.   On: 10/01/2023 00:01  LOS: 0 days   Rohen Kimes  [facility]   Pager on [email or web address]. If 7PM-7AM, please contact night-coverage at [email or web address]     10/02/2023, 8:57 AM

## 2023-10-03 DIAGNOSIS — S2241XA Multiple fractures of ribs, right side, initial encounter for closed fracture: Secondary | ICD-10-CM | POA: Diagnosis not present

## 2023-10-03 DIAGNOSIS — F0390 Unspecified dementia without behavioral disturbance: Secondary | ICD-10-CM

## 2023-10-03 DIAGNOSIS — F32A Depression, unspecified: Secondary | ICD-10-CM

## 2023-10-03 DIAGNOSIS — R296 Repeated falls: Secondary | ICD-10-CM | POA: Diagnosis not present

## 2023-10-03 DIAGNOSIS — B964 Proteus (mirabilis) (morganii) as the cause of diseases classified elsewhere: Secondary | ICD-10-CM

## 2023-10-03 DIAGNOSIS — F419 Anxiety disorder, unspecified: Secondary | ICD-10-CM

## 2023-10-03 DIAGNOSIS — N390 Urinary tract infection, site not specified: Secondary | ICD-10-CM

## 2023-10-03 LAB — URINE CULTURE: Culture: 80000 — AB

## 2023-10-03 LAB — MAGNESIUM: Magnesium: 2.3 mg/dL (ref 1.7–2.4)

## 2023-10-03 LAB — POTASSIUM: Potassium: 4.3 mmol/L (ref 3.5–5.1)

## 2023-10-03 NOTE — Plan of Care (Signed)

## 2023-10-03 NOTE — TOC Progression Note (Signed)
Transition of Care [facility]) - Progression Note    Patient Details  Name: Joanna Reed MRN: [id number] Date of Birth: [birth year]  Transition of [facility]) CM/SW Contact  Marlowe Sax, RN Phone Number: 10/03/2023, 11:09 AM  Clinical Narrative:     Sherron Monday with Asher Muir at [facility] she stated that the patient has to be evaluated prior to returning to [facility], I explained that she is Observation status and it will need to happen soon, she stated tomorrow or Monday I explained to her this CAN NOT wait until Monday that the patient is Observation She stated that she will make the director aware and that possibly he can come tomorrow       Expected Discharge Plan and Services                                               Social Determinants of Health (SDOH) Interventions SDOH Screenings   Food Insecurity: Patient Unable To Answer (10/02/2023)  Housing: High Risk (10/02/2023)  Transportation Needs: Patient Unable To Answer (10/02/2023)  Utilities: Patient Unable To Answer (10/02/2023)  Tobacco Use: Low Risk  (01/11/2023)    Readmission Risk Interventions     No data to display

## 2023-10-03 NOTE — TOC Progression Note (Signed)
Transition of Care [facility]) - Progression Note    Patient Details  Name: Joanna Reed MRN: [id number] Date of Birth: 06-27-1941  Transition of Care [facility]) CM/SW Contact  Marlowe Sax, RN Phone Number: 10/03/2023, 11:26 AM  Clinical Narrative:    Attempted to call Friend listed in the chart, left a VM for a call back, Daughter lives in [place] and it is 3 AM there   Expected Discharge Plan: Home w Home Health Services Barriers to Discharge: Other (must enter comment) (waiting on [facility] to assess to see if they will allow her to come back)  Expected Discharge Plan and Services   Discharge Planning Services: CM Consult   Living arrangements for the past 2 months: Assisted Living Facility (Memory Care)                                       Social Determinants of Health (SDOH) Interventions SDOH Screenings   Food Insecurity: Patient Unable To Answer (10/02/2023)  Housing: High Risk (10/02/2023)  Transportation Needs: Patient Unable To Answer (10/02/2023)  Utilities: Patient Unable To Answer (10/02/2023)  Tobacco Use: Low Risk  (01/11/2023)    Readmission Risk Interventions     No data to display

## 2023-10-03 NOTE — Progress Notes (Signed)
  Progress Note   Patient: Joanna Reed NFA:[id number] DOB: 16-Dec-1940 DOA: 09/30/2023     0 DOS: the patient was seen and examined on 10/03/2023   Brief hospital course: Joanna Reed is a 82 y.o. female  with medical history significant for anxiety/depression, recurrent UTI, hypertension, dementia, who presented to the hospital from the nursing facility because of an unwitnessed fall. She was found to have multiple right-sided rib fractures and urinalysis was concerning for urinary tract infection.  Assessment and Plan: Multiple closed fractures of right-sided ribs (6 through 10) s/p fall: Analgesics as needed for pain.    Proteus UTI: Urine culture showed Proteus mirabilis.  Sensitivity report reviewed.  Continue IV ceftriaxone for 3 days, will transition to oral ampicillin prior to discharge.    Hypokalemia: Replete potassium and monitor levels.    Anxiety, depression, dementia: Continue psychotropics    Out of bed to chair. Incentive spirometry. Nursing supportive care. Fall, aspiration precautions. DVT prophylaxis   Code Status: Full Code  Subjective: Patient is seen and examined today morning. She is lying in bed. Unable to provide history due to baseline dementia. No overnight issues.  Physical Exam: Vitals:   10/02/23 1536 10/02/23 2322 10/03/23 0833 10/03/23 1545  BP: (!) 121/92 105/60 (!) 137/99 128/78  Pulse: (!) 102 72 95 87  Resp: 16 16 16 16   Temp:  99 F (37.2 C) 98 F (36.7 C)   TempSrc:      SpO2: 100% 95% 95% 93%  Weight:      Height:        General - Elderly Caucasian female, no apparent distress HEENT - PERRLA, EOMI, atraumatic head, non tender sinuses. Lung - Clear, no rales, rhonchi, wheezes. Heart - S1, S2 heard, no murmurs, rubs, trace pedal edema. Abdomen - Soft, non tender, bowel sounds good Neuro - Alert, awake unable to follow commands, non focal exam. Skin - Warm and dry.  Data Reviewed:      Latest Ref Rng & Units 10/02/2023     4:29 AM 10/01/2023    1:04 AM 01/11/2023    8:40 PM  CBC  WBC 4.0 - 10.5 K/uL 6.9  15.3  7.2   Hemoglobin 12.0 - 15.0 g/dL 46.9  62.9  9.8   Hematocrit 36.0 - 46.0 % 35.2  34.2  30.8   Platelets 150 - 400 K/uL 240  269  253       Latest Ref Rng & Units 10/03/2023    5:40 AM 10/02/2023    4:29 AM 10/01/2023    1:04 AM  BMP  Glucose 70 - 99 mg/dL  91  528   BUN 8 - 23 mg/dL  16  24   Creatinine 4.13 - 1.00 mg/dL  2.44  0.10   Sodium 272 - 145 mmol/L  139  140   Potassium 3.5 - 5.1 mmol/L 4.3  3.2  3.4   Chloride 98 - 111 mmol/L  108  104   CO2 22 - 32 mmol/L  22  26   Calcium 8.9 - 10.3 mg/dL  8.1  8.7    No results found.   Disposition: Status is: Observation The patient remains OBS appropriate and will d/c before 2 midnights.  Planned Discharge Destination: Skilled nursing facility     Time spent: 37 minutes  Author: Marcelino Duster, MD 10/03/2023 5:29 PM Secure chat 7am to 7pm For on call review [email or web address].

## 2023-10-04 DIAGNOSIS — S2241XA Multiple fractures of ribs, right side, initial encounter for closed fracture: Secondary | ICD-10-CM | POA: Diagnosis not present

## 2023-10-04 DIAGNOSIS — N390 Urinary tract infection, site not specified: Secondary | ICD-10-CM | POA: Diagnosis not present

## 2023-10-04 DIAGNOSIS — R296 Repeated falls: Secondary | ICD-10-CM | POA: Diagnosis not present

## 2023-10-04 DIAGNOSIS — F0390 Unspecified dementia without behavioral disturbance: Secondary | ICD-10-CM | POA: Diagnosis not present

## 2023-10-04 NOTE — Plan of Care (Signed)

## 2023-10-04 NOTE — Progress Notes (Signed)
Physical Therapy Treatment Patient Details Name: Joanna Reed MRN: [id number] DOB: [birth year] Today's Date: 10/04/2023   History of Present Illness Pt is an 82 y.o. F with medical history significant for anxiety/depression, recurrent UTI, HTN, and dementia who presents to ED from facility following an unwitnessed fall, resulting in closed fractures of R ribs 6, 7, 8.    PT Comments  Pt resting in bed upon PT arrival; pt talking during session but unable to engage in any conversation; pt appearing very confused.  During session pt max assist with bed mobility; close SBA to CGA with sitting balance (pt leaning backwards at times requiring assist for safety); and max assist x2 to attempt standing (x4 trials) up to RW.  Pt appearing fearful with activity and tended not to put weight through L LE in standing (L foot sometimes off floor or just touching floor); significant posterior lean noted in standing.  Will continue to focus on strengthening, balance, and progressive functional mobility during hospitalization.  PT recommendations updated; TOC updated.   If plan is discharge home, recommend the following: Two people to help with walking and/or transfers;A lot of help with bathing/dressing/bathroom;Assistance with cooking/housework;Assistance with feeding;Direct supervision/assist for medications management;Direct supervision/assist for financial management;Assist for transportation;Help with stairs or ramp for entrance;Supervision due to cognitive status   Can travel by private vehicle     No  Equipment Recommendations  Other (comment) (TBD at next facility)    Recommendations for Other Services       Precautions / Restrictions Precautions Precautions: Fall Restrictions Weight Bearing Restrictions Per Provider Order: No     Mobility  Bed Mobility Overal bed mobility: Needs Assistance Bed Mobility: Supine to Sit, Sit to Supine     Supine to sit: Max assist, HOB elevated Sit to  supine: Max assist, HOB elevated   General bed mobility comments: assist for trunk and B LE's    Transfers Overall transfer level: Needs assistance Equipment used: Rolling walker (2 wheels) Transfers: Sit to/from Stand Sit to Stand: Max assist, +2 physical assistance           General transfer comment: x4 trials attempting to stand from bed; pt appearing fearful; pt tending not to put weight through L LE in standing (L foot either off floor or touching ground)    Ambulation/Gait               General Gait Details: unable to safely maintain standing to attempt   Stairs             Wheelchair Mobility     Tilt Bed    Modified Rankin (Stroke Patients Only)       Balance Overall balance assessment: Needs assistance Sitting-balance support: No upper extremity supported, Feet supported Sitting balance-Leahy Scale: Fair Sitting balance - Comments: steady sitting reaching within BOS but pt intermittently leaning backwards on own requiring assist for safety   Standing balance support: Bilateral upper extremity supported, Reliant on assistive device for balance Standing balance-Leahy Scale: Poor Standing balance comment: 2 assist with significant posterior lean noted in standing; RW used                            Cognition Arousal: Alert Behavior During Therapy: Restless, Anxious Overall Cognitive Status: No family/caregiver present to determine baseline cognitive functioning Area of Impairment: Orientation, Awareness, Following commands  General Comments: Pt did not state name or DOB when asked (pt tangential and talking during session but significant difficulty engaging in any conversation noted)        Exercises      General Comments        Pertinent Vitals/Pain Pain Assessment Pain Assessment: Faces Faces Pain Scale:  (0/10 at rest; 4/10 with activity) Pain Location: R ribs Pain  Descriptors / Indicators: Discomfort, Sore Pain Intervention(s): Limited activity within patient's tolerance, Monitored during session, Repositioned Vitals (HR and SpO2 on room air) stable and WFL throughout treatment session.    Home Living                          Prior Function            PT Goals (current goals can now be found in the care plan section) Acute Rehab PT Goals PT Goal Formulation: Patient unable to participate in goal setting Progress towards PT goals: Not progressing toward goals - comment (unable to progress to ambulation today)    Frequency    Min 1X/week      PT Plan      Co-evaluation              AM-PAC PT "6 Clicks" Mobility   Outcome Measure  Help needed turning from your back to your side while in a flat bed without using bedrails?: A Little Help needed moving from lying on your back to sitting on the side of a flat bed without using bedrails?: A Lot Help needed moving to and from a bed to a chair (including a wheelchair)?: Total Help needed standing up from a chair using your arms (e.g., wheelchair or bedside chair)?: Total Help needed to walk in hospital room?: Total Help needed climbing 3-5 steps with a railing? : Total 6 Click Score: 9    End of Session Equipment Utilized During Treatment: Gait belt Activity Tolerance: Other (comment) (Limited d/t cognition and weakness) Patient left: in bed;with call bell/phone within reach;with bed alarm set;Other (comment) (B heels floating via pillow support) Nurse Communication: Mobility status;Precautions (via whiteboard) PT Visit Diagnosis: Muscle weakness (generalized) (M62.81);Unsteadiness on feet (R26.81);Pain Pain - Right/Left: Right Pain - part of body:  (ribs)     Time: 3244-0102 PT Time Calculation (min) (ACUTE ONLY): 23 min  Charges:    $Therapeutic Activity: 23-37 mins PT General Charges $$ ACUTE PT VISIT: 1 Visit                     Hendricks Limes,  PT 10/04/23, 12:12 PM

## 2023-10-04 NOTE — Plan of Care (Signed)

## 2023-10-04 NOTE — Care Management Obs Status (Signed)
MEDICARE OBSERVATION STATUS NOTIFICATION   Patient Details  Name: Joanna Reed MRN: [id number] Date of Birth: Aug 01, 1941   Medicare Observation Status Notification Given:  Yes    Marlowe Sax, RN 10/04/2023, 9:37 AM

## 2023-10-04 NOTE — TOC Progression Note (Signed)
Transition of Care [facility]) - Progression Note    Patient Details  Name: Joanna Reed MRN: [id number] Date of Birth: [birth year]  Transition of [facility]) CM/SW Contact  Marlowe Sax, RN Phone Number: 10/04/2023, 9:21 AM  Clinical Narrative:    Spoke with POA Joanna Reed  and she confirms that the plan is to return to [facility] She will reach out to Ed [employer] manager at [employer] and ask them to come and assess her, She stated that she will need EMS to transport I explained the MOON to her and that she is Observation status   Expected Discharge Plan: Home w Home Health Services Barriers to Discharge: Other (must enter comment) (waiting on [facility] to assess to see if they will allow her to come back)  Expected Discharge Plan and Services   Discharge Planning Services: CM Consult   Living arrangements for the past 2 months: Assisted Living Facility (Memory Care)                                       Social Determinants of Health (SDOH) Interventions SDOH Screenings   Food Insecurity: Patient Unable To Answer (10/02/2023)  Housing: High Risk (10/02/2023)  Transportation Needs: Patient Unable To Answer (10/02/2023)  Utilities: Patient Unable To Answer (10/02/2023)  Tobacco Use: Low Risk  (01/11/2023)    Readmission Risk Interventions     No data to display

## 2023-10-04 NOTE — Progress Notes (Signed)
  Progress Note   Patient: Joanna Reed ZOX:[id number] DOB: June 14, 1941 DOA: 09/30/2023     0 DOS: the patient was seen and examined on 10/04/2023   Brief hospital course: Joanna Reed is a 82 y.o. female  with medical history significant for anxiety/depression, recurrent UTI, hypertension, dementia, who presented to the hospital from the nursing facility because of an unwitnessed fall. She was found to have multiple right-sided rib fractures and urinalysis was concerning for urinary tract infection.  Assessment and Plan: Multiple closed fractures of right-sided ribs (6 through 10) s/p fall: Analgesics as needed for pain.    Proteus UTI: Urine culture showed Proteus mirabilis.  Sensitivity report reviewed.  Continue IV ceftriaxone while inpatient and transition to oral ampicillin upon discharge.    Hypokalemia: Replete potassium and monitor levels.    Anxiety, depression, dementia: Continue psychotropics  Out of bed to chair. Incentive spirometry. Nursing supportive care. Fall, aspiration precautions. DVT prophylaxis   Code Status: Full Code  Subjective: Patient is seen and examined today morning. She is lying in bed. Sleeping comfortably. Unable to provide history due to baseline dementia.   Physical Exam: Vitals:   10/03/23 1545 10/04/23 0336 10/04/23 0801 10/04/23 1536  BP: 128/78 (!) 143/70 131/75 132/71  Pulse: 87 67 71 92  Resp: 16 16 16 17   Temp:  98.4 F (36.9 C) (!) 97.4 F (36.3 C) 98 F (36.7 C)  TempSrc:  Oral    SpO2: 93% 98% 99% 97%  Weight:      Height:        General - Elderly Caucasian female, no apparent distress HEENT - PERRLA, EOMI, atraumatic head, non tender sinuses. Lung - Clear, no rales, rhonchi, wheezes. Heart - S1, S2 heard, no murmurs, rubs, trace pedal edema. Abdomen - Soft, non tender, bowel sounds good Neuro - Alert, awake unable to follow commands, non focal exam. Skin - Warm and dry.  Data Reviewed:      Latest Ref Rng & Units  10/02/2023    4:29 AM 10/01/2023    1:04 AM 01/11/2023    8:40 PM  CBC  WBC 4.0 - 10.5 K/uL 6.9  15.3  7.2   Hemoglobin 12.0 - 15.0 g/dL 81.1  91.4  9.8   Hematocrit 36.0 - 46.0 % 35.2  34.2  30.8   Platelets 150 - 400 K/uL 240  269  253       Latest Ref Rng & Units 10/03/2023    5:40 AM 10/02/2023    4:29 AM 10/01/2023    1:04 AM  BMP  Glucose 70 - 99 mg/dL  91  782   BUN 8 - 23 mg/dL  16  24   Creatinine 9.56 - 1.00 mg/dL  2.13  0.86   Sodium 578 - 145 mmol/L  139  140   Potassium 3.5 - 5.1 mmol/L 4.3  3.2  3.4   Chloride 98 - 111 mmol/L  108  104   CO2 22 - 32 mmol/L  22  26   Calcium 8.9 - 10.3 mg/dL  8.1  8.7    No results found.   Disposition: Status is: Observation The patient remains OBS appropriate and will d/c before 2 midnights.  Planned Discharge Destination: Skilled nursing facility     Time spent: 36 minutes  Author: Marcelino Duster, MD 10/04/2023 4:25 PM Secure chat 7am to 7pm For on call review [email or web address].

## 2023-10-05 DIAGNOSIS — R296 Repeated falls: Secondary | ICD-10-CM | POA: Diagnosis not present

## 2023-10-05 DIAGNOSIS — S2241XA Multiple fractures of ribs, right side, initial encounter for closed fracture: Secondary | ICD-10-CM | POA: Diagnosis not present

## 2023-10-05 DIAGNOSIS — F0390 Unspecified dementia without behavioral disturbance: Secondary | ICD-10-CM | POA: Diagnosis not present

## 2023-10-05 DIAGNOSIS — N390 Urinary tract infection, site not specified: Secondary | ICD-10-CM | POA: Diagnosis not present

## 2023-10-05 MED ORDER — POLYETHYLENE GLYCOL 3350 17 G PO PACK
17.0000 g | PACK | Freq: Every day | ORAL | Status: DC
  Administered 2023-10-05 – 2023-10-08 (×4): 17 g via ORAL
  Filled 2023-10-05 (×4): qty 1

## 2023-10-05 MED ORDER — SENNA 8.6 MG PO TABS
2.0000 | ORAL_TABLET | Freq: Every day | ORAL | Status: DC
  Administered 2023-10-05 – 2023-10-08 (×4): 17.2 mg via ORAL
  Filled 2023-10-05 (×4): qty 2

## 2023-10-05 MED ORDER — DOCUSATE SODIUM 100 MG PO CAPS
100.0000 mg | ORAL_CAPSULE | Freq: Two times a day (BID) | ORAL | Status: DC
  Administered 2023-10-05 – 2023-10-08 (×8): 100 mg via ORAL
  Filled 2023-10-05 (×8): qty 1

## 2023-10-05 NOTE — Progress Notes (Signed)
  Progress Note   Patient: Joanna Reed VHQ:[id number] DOB: 06-13-1941 DOA: 09/30/2023     0 DOS: the patient was seen and examined on 10/05/2023   Brief hospital course: Joanna Reed is a 82 y.o. female  with medical history significant for anxiety/depression, recurrent UTI, hypertension, dementia, who presented to the hospital from the nursing facility because of an unwitnessed fall. She was found to have multiple right-sided rib fractures and urinalysis was concerning for urinary tract infection.  Assessment and Plan: Multiple closed fractures of right-sided ribs (6 through 10) s/p fall: Analgesics as needed for pain.    Proteus UTI: Urine culture showed Proteus mirabilis.  Sensitivity report reviewed.  Continue IV ceftriaxone for total 5 days.    Hypokalemia: Resolved.    Anxiety, depression, dementia:  Continue psychotropics Supportive care. Assist with feeding.  Out of bed to chair.  Encourage incentive spirometry. Nursing supportive care. Fall, aspiration precautions. DVT prophylaxis   Code Status: Full Code  Subjective: Patient is seen and examined today morning. She is lying in bed. Able to answer me slowly, unable to understand due to her speech. RN at bedside, feeding her breakfast.   Physical Exam: Vitals:   10/04/23 0801 10/04/23 1536 10/04/23 2324 10/05/23 1038  BP: 131/75 132/71 (!) 112/58 126/79  Pulse: 71 92 60 86  Resp: 16 17 18 16   Temp: (!) 97.4 F (36.3 C) 98 F (36.7 C)  98 F (36.7 C)  TempSrc:      SpO2: 99% 97% 96% 98%  Weight:      Height:        General - Elderly Caucasian thin built female, no apparent distress HEENT - PERRLA, EOMI, atraumatic head, non tender sinuses. Lung - Clear, no rales, rhonchi, wheezes. Heart - S1, S2 heard, no murmurs, rubs, trace pedal edema. Abdomen - Soft, non tender, bowel sounds good Neuro - Alert, awake unable to follow commands, has dysarthria Skin - Warm and dry.  Data Reviewed:      Latest Ref  Rng & Units 10/02/2023    4:29 AM 10/01/2023    1:04 AM 01/11/2023    8:40 PM  CBC  WBC 4.0 - 10.5 K/uL 6.9  15.3  7.2   Hemoglobin 12.0 - 15.0 g/dL 41.3  24.4  9.8   Hematocrit 36.0 - 46.0 % 35.2  34.2  30.8   Platelets 150 - 400 K/uL 240  269  253       Latest Ref Rng & Units 10/03/2023    5:40 AM 10/02/2023    4:29 AM 10/01/2023    1:04 AM  BMP  Glucose 70 - 99 mg/dL  91  010   BUN 8 - 23 mg/dL  16  24   Creatinine 2.72 - 1.00 mg/dL  5.36  6.44   Sodium 034 - 145 mmol/L  139  140   Potassium 3.5 - 5.1 mmol/L 4.3  3.2  3.4   Chloride 98 - 111 mmol/L  108  104   CO2 22 - 32 mmol/L  22  26   Calcium 8.9 - 10.3 mg/dL  8.1  8.7    No results found.   Disposition: Status is: Observation The patient remains OBS appropriate and will d/c before 2 midnights.  Planned Discharge Destination: Skilled nursing facility     Time spent: 36 minutes  Author: Marcelino Duster, MD 10/05/2023 2:48 PM Secure chat 7am to 7pm For on call review [email or web address].

## 2023-10-05 NOTE — TOC Progression Note (Signed)
Transition of Care [facility]) - Progression Note    Patient Details  Name: Joanna Reed MRN: [id number] Date of Birth: 11-May-1941  [facility] CM/SW Contact  Marlowe Sax, RN Phone Number: 10/05/2023, 4:58 PM  Clinical Narrative:     Bedsearch sent to go to STR prior to returning to her memory care unit, PASSR obtained, Fl2 completed, review the bed offers with her friend and POA  Expected Discharge Plan: Home w Home Health Services Barriers to Discharge: Other (must enter comment) (waiting on [facility] to assess to see if they will allow her to come back)  Expected Discharge Plan and Services   Discharge Planning Services: CM Consult   Living arrangements for the past 2 months: Assisted Living Facility (Memory Care)                                       Social Determinants of Health (SDOH) Interventions SDOH Screenings   Food Insecurity: Patient Unable To Answer (10/02/2023)  Housing: High Risk (10/02/2023)  Transportation Needs: Patient Unable To Answer (10/02/2023)  Utilities: Patient Unable To Answer (10/02/2023)  Tobacco Use: Low Risk  (01/11/2023)    Readmission Risk Interventions     No data to display

## 2023-10-05 NOTE — Plan of Care (Signed)

## 2023-10-05 NOTE — Progress Notes (Signed)
Occupational Therapy Treatment Patient Details Name: Joanna Reed MRN: [id number] DOB: 1940-12-09 Today's Date: 10/05/2023   History of present illness Pt is an 82 y.o. F with medical history significant for anxiety/depression, recurrent UTI, HTN, and dementia who presents to ED from facility following an unwitnessed fall, resulting in closed fractures of R ribs 6, 7, 8.   OT comments  Pt is supine in bed on arrival. Easily arousable and agreeable to OT session. She is unable to verbalize pain level, but occasional grimace/holding to R rib area. Continues to require Max A for bed mobility with inability to initiate, but will assist with leg movement once started. CGA to maintain seated balance at EOB without UE support at times. Attempting to stand on her own, so OT provided Max/total assist, but pt unable to reach full upright stand therefore deferred further trial and pt returned to supine. Pt talking/mumbling unintelligible speech throughout session, noted to slap herself on the knees and yell a few times during session. Pt left with all needs in place and will cont to require skilled acute OT services to maximize her return to PLOF.       If plan is discharge home, recommend the following:  Two people to help with walking and/or transfers;Assist for transportation;Help with stairs or ramp for entrance;A lot of help with bathing/dressing/bathroom;Supervision due to cognitive status;Direct supervision/assist for medications management;Direct supervision/assist for financial management   Equipment Recommendations  None recommended by OT    Recommendations for Other Services      Precautions / Restrictions Precautions Precaution Comments: closed R rib fractures 6, 7, 8 Restrictions Weight Bearing Restrictions Per Provider Order: No       Mobility Bed Mobility Overal bed mobility: Needs Assistance Bed Mobility: Supine to Sit, Sit to Supine     Supine to sit: Max assist, HOB  elevated Sit to supine: Max assist, HOB elevated   General bed mobility comments: Max A for intiation and trunk and BLE management    Transfers Overall transfer level: Needs assistance Equipment used: Rolling walker (2 wheels)               General transfer comment: attempted STS as pt was attempting to stand on her own, however unable to do so safely so deferred further     Balance Overall balance assessment: Needs assistance Sitting-balance support: No upper extremity supported, Feet supported Sitting balance-Leahy Scale: Fair Sitting balance - Comments: CGA provided throughout for safety with seated balance at EOB, however pt able to sit without UE support Postural control: Posterior lean                                 ADL either performed or assessed with clinical judgement   ADL Overall ADL's : Needs assistance/impaired Eating/Feeding: Minimal assistance;Bed level                                     General ADL Comments: hand feeder at baseline; likely assist with ADLs from memory care staff at ALF    [facility] Assessment              Vision       Perception     Praxis      Cognition Arousal: Alert Behavior During Therapy: Restless, Anxious Overall Cognitive Status: No family/caregiver present to determine baseline cognitive functioning Area  of Impairment: Orientation, Awareness, Following commands                               General Comments: Pt did not state name or DOB when asked (pt tangential and talking during session but significant difficulty engaging in any conversation noted)        Exercises      Shoulder Instructions       General Comments      Pertinent Vitals/ Pain       Pain Assessment Pain Assessment: Faces Faces Pain Scale: Hurts a little bit Pain Location: R ribs Pain Descriptors / Indicators: Discomfort, Sore Pain Intervention(s): Monitored during session, Limited  activity within patient's tolerance  Home Living                                          Prior Functioning/Environment              Frequency  Min 1X/week        Progress Toward Goals  OT Goals(current goals can now be found in the care plan section)  Progress towards OT goals: Progressing toward goals  Acute Rehab OT Goals Patient Stated Goal: improve strength, balance and cognition OT Goal Formulation: With patient Time For Goal Achievement: 10/15/23 Potential to Achieve Goals: Fair  Plan      Co-evaluation                 AM-PAC OT "6 Clicks" Daily Activity     Outcome Measure   Help from another person eating meals?: A Lot Help from another person taking care of personal grooming?: A Lot Help from another person toileting, which includes using toliet, bedpan, or urinal?: Total Help from another person bathing (including washing, rinsing, drying)?: Total Help from another person to put on and taking off regular upper body clothing?: A Lot Help from another person to put on and taking off regular lower body clothing?: Total 6 Click Score: 9    End of Session    OT Visit Diagnosis: Other abnormalities of gait and mobility (R26.89);Unsteadiness on feet (R26.81);Muscle weakness (generalized) (M62.81);History of falling (Z91.81)   Activity Tolerance Patient tolerated treatment well   Patient Left in bed;with call bell/phone within reach;with bed alarm set   Nurse Communication          Time: [phone] OT Time Calculation (min): 17 min  Charges: OT General Charges $OT Visit: 1 Visit OT Treatments $Therapeutic Activity: 8-22 mins  Ura Hausen, OTR/L  10/05/23, 11:00 AM   [name] 10/05/2023, 10:57 AM

## 2023-10-05 NOTE — [place] FL2 (Signed)
[place] MEDICAID FL2 LEVEL OF CARE FORM     IDENTIFICATION  Patient Name: Joanna Reed Birthdate: 12-07-1940 Sex: female Admission Date (Current Location): 09/30/2023  [facility] and [place] Number:  Chiropodist and Address:  [facility], [address], [place], [place] [address]      Provider Number: [id number]  Attending Physician Name and Address:  Marcelino Duster, MD  Relative Name and Phone Number:  Therisa Doyne  Friend  Emergency Contact  [phone]  POA    Current Level of Care: Hospital Recommended Level of Care: Skilled Nursing Facility Prior Approval Number:    Date Approved/Denied:   PASRR Number: [id number]  Discharge Plan: SNF    Current Diagnoses: Patient Active Problem List   Diagnosis Date Noted   Multiple closed fractures of ribs of right side 10/01/2023   Unwitnessed fall 10/01/2023   AMS (altered mental status) 09/15/2022   Anemia 09/15/2022   Generalized anxiety disorder 12/31/2020   Dysthymia 12/31/2020   Dementia without behavioral disturbance (HCC) 12/31/2020   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   Urinary tract infection 12/29/2020   Fall at home, initial encounter 12/29/2020   Acute metabolic encephalopathy 12/29/2020   Pressure injury of skin 12/29/2020   Preventative health care 03/19/2018   Chronic knee pain 02/27/2017   Medicare annual wellness visit, subsequent 01/28/2015   Anxiety and depression 01/19/2015   Acute shoulder pain 01/19/2015    Orientation RESPIRATION BLADDER Height & Weight     Self  Normal Incontinent Weight: 50 kg Height:  5' (152.4 cm)  BEHAVIORAL SYMPTOMS/MOOD NEUROLOGICAL BOWEL NUTRITION STATUS      Incontinent Diet (See dc summary)  AMBULATORY STATUS COMMUNICATION OF NEEDS Skin   Extensive Assist Verbally Normal                       Personal Care Assistance Level of Assistance  Bathing, Feeding, Dressing Bathing Assistance: Maximum  assistance Feeding assistance: Limited assistance Dressing Assistance: Maximum assistance     Functional Limitations Info  Sight, Hearing, Speech Sight Info: Adequate Hearing Info: Adequate Speech Info: Adequate    SPECIAL CARE FACTORS FREQUENCY  PT (By licensed PT), OT (By licensed OT)     PT Frequency: 5 times per week OT Frequency: 5 times per week            Contractures Contractures Info: Not present    Additional Factors Info  Code Status, Allergies Code Status Info: full code Allergies Info: sulfa ABX           Current Medications (10/05/2023):  This is the current hospital active medication list Current Facility-Administered Medications  Medication Dose Route Frequency Provider Last Rate Last Admin   acetaminophen (TYLENOL) tablet 650 mg  650 mg Oral Q6H PRN Andris Baumann, MD   650 mg at 10/01/23 0858   Or   acetaminophen (TYLENOL) suppository 650 mg  650 mg Rectal Q6H PRN Andris Baumann, MD       amLODipine (NORVASC) tablet 5 mg  5 mg Oral Daily Lindajo Royal V, MD   5 mg at 10/05/23 1007   cefTRIAXone (ROCEPHIN) 1 g in sodium chloride 0.9 % 100 mL IVPB  1 g Intravenous Q24H Marcelino Duster, MD 200 mL/hr at 10/05/23 0700 Infusion Verify at 10/05/23 0700   docusate sodium (COLACE) capsule 100 mg  100 mg Oral BID Marcelino Duster, MD   100 mg at 10/05/23 1319   donepezil (ARICEPT) tablet 10 mg  10 mg Oral Daily Lindajo Royal V, MD   10 mg at 10/05/23 1007   enoxaparin (LOVENOX) injection 40 mg  40 mg Subcutaneous Q24H Lindajo Royal V, MD   40 mg at 10/05/23 1007   guaiFENesin (MUCINEX) 12 hr tablet 600 mg  600 mg Oral BID PRN Andris Baumann, MD       HYDROcodone-acetaminophen (NORCO/VICODIN) 5-325 MG per tablet 1-2 tablet  1-2 tablet Oral Q4H PRN Andris Baumann, MD       melatonin tablet 5 mg  5 mg Oral QHS PRN Andris Baumann, MD       morphine (PF) 2 MG/ML injection 2 mg  2 mg Intravenous Q2H PRN Andris Baumann, MD       ondansetron [facility])  tablet 4 mg  4 mg Oral Q6H PRN Andris Baumann, MD       Or   ondansetron [facility]) injection 4 mg  4 mg Intravenous Q6H PRN Andris Baumann, MD       polyethylene glycol (MIRALAX / GLYCOLAX) packet 17 g  17 g Oral Daily Marcelino Duster, MD   17 g at 10/05/23 1319   risperiDONE (RISPERDAL) tablet 0.25 mg  0.25 mg Oral BID Lindajo Royal V, MD   0.25 mg at 10/05/23 1007   senna (SENOKOT) tablet 17.2 mg  2 tablet Oral QHS Marcelino Duster, MD       sertraline (ZOLOFT) tablet 200 mg  200 mg Oral Daily Andris Baumann, MD   200 mg at 10/05/23 1007     Discharge Medications: Please see discharge summary for a list of discharge medications.  Relevant Imaging Results:  Relevant Lab Results:   Additional Information SS#: [id number]  Marlowe Sax, RN

## 2023-10-06 ENCOUNTER — Encounter: Payer: Self-pay | Admitting: Internal Medicine

## 2023-10-06 DIAGNOSIS — N390 Urinary tract infection, site not specified: Secondary | ICD-10-CM | POA: Diagnosis not present

## 2023-10-06 DIAGNOSIS — R296 Repeated falls: Secondary | ICD-10-CM | POA: Diagnosis not present

## 2023-10-06 DIAGNOSIS — S2241XA Multiple fractures of ribs, right side, initial encounter for closed fracture: Secondary | ICD-10-CM | POA: Diagnosis not present

## 2023-10-06 DIAGNOSIS — F0390 Unspecified dementia without behavioral disturbance: Secondary | ICD-10-CM | POA: Diagnosis not present

## 2023-10-06 NOTE — Plan of Care (Signed)

## 2023-10-06 NOTE — Progress Notes (Signed)
  Progress Note   Patient: Joanna Reed ZOX:[id number] DOB: 07/10/1941 DOA: 09/30/2023     0 DOS: the patient was seen and examined on 10/06/2023   Brief hospital course: Daysy Bingle is a 82 y.o. female  with medical history significant for anxiety/depression, recurrent UTI, hypertension, dementia, who presented to the hospital from the nursing facility because of an unwitnessed fall. She was found to have multiple right-sided rib fractures and urinalysis was concerning for urinary tract infection.  Assessment and Plan: Multiple closed fractures of right-sided ribs (6 through 10) s/p fall: Analgesics as needed for pain.    Proteus UTI:  finished IV ceftriaxone for total 5 day course.    Hypokalemia: Resolved.    Anxiety, depression, dementia:  Continue psychotropics Supportive care. Assist with feeding.  Out of bed to chair.  Encourage incentive spirometry. Nursing supportive care. Fall, aspiration precautions. DVT prophylaxis   Code Status: Full Code  Subjective: Patient is seen and examined today morning. She is lying in bed. Awake, says she feels fine, but also unable to understand due to her abnormal speech, dementia. Asks for her breakfast.   Physical Exam: Vitals:   10/05/23 1038 10/05/23 2142 10/06/23 0751 10/06/23 1539  BP: 126/79 126/76 120/71 106/69  Pulse: 86 71 73 74  Resp: 16 18 16 16   Temp: 98 F (36.7 C) (!) 97.4 F (36.3 C) 98.6 F (37 C) 98.7 F (37.1 C)  TempSrc:  Oral Oral   SpO2: 98% 97% 96% 96%  Weight:      Height:        General - Elderly Caucasian thin built female, no apparent distress HEENT - PERRLA, EOMI, atraumatic head, non tender sinuses. Lung - Clear, no rales, rhonchi, wheezes. Heart - S1, S2 heard, no murmurs, rubs, trace pedal edema. Abdomen - Soft, non tender, bowel sounds good Neuro - Alert, awake unable to follow commands, has dysarthria Skin - Warm and dry.  Data Reviewed:      Latest Ref Rng & Units 10/02/2023     4:29 AM 10/01/2023    1:04 AM 01/11/2023    8:40 PM  CBC  WBC 4.0 - 10.5 K/uL 6.9  15.3  7.2   Hemoglobin 12.0 - 15.0 g/dL 81.1  91.4  9.8   Hematocrit 36.0 - 46.0 % 35.2  34.2  30.8   Platelets 150 - 400 K/uL 240  269  253       Latest Ref Rng & Units 10/03/2023    5:40 AM 10/02/2023    4:29 AM 10/01/2023    1:04 AM  BMP  Glucose 70 - 99 mg/dL  91  782   BUN 8 - 23 mg/dL  16  24   Creatinine 9.56 - 1.00 mg/dL  2.13  0.86   Sodium 578 - 145 mmol/L  139  140   Potassium 3.5 - 5.1 mmol/L 4.3  3.2  3.4   Chloride 98 - 111 mmol/L  108  104   CO2 22 - 32 mmol/L  22  26   Calcium 8.9 - 10.3 mg/dL  8.1  8.7    No results found.   Disposition: Status is: Observation The patient remains OBS appropriate and will d/c before 2 midnights.  Planned Discharge Destination: Skilled nursing facility     Time spent: 35 minutes  Author: Marcelino Duster, MD 10/06/2023 4:31 PM Secure chat 7am to 7pm For on call review [email or web address]

## 2023-10-06 NOTE — TOC Progression Note (Signed)
Transition of Care [facility]) - Progression Note    Patient Details  Name: Joanna Reed MRN: [id number] Date of Birth: May 15, 1941  [facility] of Care [facility]) CM/SW Contact  Bing Quarry, RN Phone Number: 10/06/2023, 4:09 PM  Clinical Narrative:  12/24: Two offers "considering", the  rest remain pending as of 410 pm today.   Gabriel Cirri MSN RN CM  Care Management Department.  [place]  [facility] Direct Dial: [phone] Main Office Phone: [phone] Weekends Only      Expected Discharge Plan: Home w Home Health Services Barriers to Discharge: Other (must enter comment) (waiting on Diamantina Monks to assess to see if they will allow her to come back)  Expected Discharge Plan and Services   Discharge Planning Services: CM Consult   Living arrangements for the past 2 months: Assisted Living Facility (Memory Care)                                       Social Determinants of Health (SDOH) Interventions SDOH Screenings   Food Insecurity: Patient Unable To Answer (10/02/2023)  Housing: High Risk (10/02/2023)  Transportation Needs: Patient Unable To Answer (10/02/2023)  Utilities: Patient Unable To Answer (10/02/2023)  Tobacco Use: Low Risk  (10/06/2023)    Readmission Risk Interventions     No data to display

## 2023-10-07 DIAGNOSIS — N390 Urinary tract infection, site not specified: Secondary | ICD-10-CM | POA: Diagnosis not present

## 2023-10-07 DIAGNOSIS — F0390 Unspecified dementia without behavioral disturbance: Secondary | ICD-10-CM | POA: Diagnosis not present

## 2023-10-07 DIAGNOSIS — R296 Repeated falls: Secondary | ICD-10-CM | POA: Diagnosis not present

## 2023-10-07 DIAGNOSIS — F419 Anxiety disorder, unspecified: Secondary | ICD-10-CM | POA: Diagnosis not present

## 2023-10-07 LAB — BASIC METABOLIC PANEL
Anion gap: 7 (ref 5–15)
BUN: 28 mg/dL — ABNORMAL HIGH (ref 8–23)
CO2: 27 mmol/L (ref 22–32)
Calcium: 9.2 mg/dL (ref 8.9–10.3)
Chloride: 106 mmol/L (ref 98–111)
Creatinine, Ser: 0.69 mg/dL (ref 0.44–1.00)
GFR, Estimated: >60 mL/min (ref >60)
Glucose, Bld: 105 mg/dL — ABNORMAL HIGH (ref 70–99)
Potassium: 4.1 mmol/L (ref 3.5–5.1)
Sodium: 140 mmol/L (ref 135–145)

## 2023-10-07 LAB — CBC
HCT: 37.4 % (ref 36.0–46.0)
Hemoglobin: 12.6 g/dL (ref 12.0–15.0)
MCH: 29.4 pg (ref 26.0–34.0)
MCHC: 33.7 g/dL (ref 30.0–36.0)
MCV: 87.4 fL (ref 80.0–100.0)
Platelets: 286 10*3/uL (ref 150–400)
RBC: 4.28 MIL/uL (ref 3.87–5.11)
RDW: 12.3 % (ref 11.5–15.5)
WBC: 6.6 10*3/uL (ref 4.0–10.5)
nRBC: 0 % (ref 0.0–0.2)

## 2023-10-07 NOTE — Plan of Care (Signed)

## 2023-10-07 NOTE — Progress Notes (Signed)
Progress Note   Patient: Joanna Reed [id number]:[id number] DOB: 1941-05-07 DOA: 09/30/2023     0 DOS: the patient was seen and examined on 10/07/2023   Brief hospital course: [name] is a 82 y.o. female  with medical history significant for anxiety/depression, recurrent UTI, hypertension, dementia, who presented to the hospital from the nursing facility because of an unwitnessed fall. She was found to have multiple right-sided rib fractures and urinalysis was concerning for urinary tract infection.   She is admitted to the hospitalist service for pain management, IV antibiotic therapy.  Urine cultures grew Proteus, patient finished IV Rocephin total 5 days.  TOC working on [employer] prior to returning to her memory care unit.  Assessment and Plan: Multiple closed fractures of right-sided ribs (6 through 10) s/p fall: Analgesics as needed for pain. Supportive care.    Proteus UTI:  finished IV ceftriaxone for total 5 day course.    Hypokalemia: Resolved.    Anxiety, depression, dementia:  Continue psychotropics Supportive care. Assist with feeding.  Out of bed to chair.  Encourage incentive spirometry. TOC working on STIR bed placement. Fall, aspiration precautions. DVT prophylaxis Lovenox   Code Status: Full Code  Subjective: Patient is seen and examined today morning. She is lying in bed. Awake, unable to understand her due to abnormal speech, dementia.   Physical Exam: Vitals:   10/06/23 0751 10/06/23 1539 10/07/23 0004 10/07/23 0809  BP: 120/71 106/69 114/75 138/77  Pulse: 73 74 60 80  Resp: 16 16 18 16   Temp: 98.6 F (37 C) 98.7 F (37.1 C) (!) 97.4 F (36.3 C) 97.6 F (36.4 C)  TempSrc: Oral     SpO2: 96% 96% 99% 98%  Weight:      Height:        General - Elderly Caucasian thin built female, no apparent distress HEENT - PERRLA, EOMI, atraumatic head, non tender sinuses. Lung - Clear, no rales, rhonchi, wheezes. Heart - S1, S2 heard, no murmurs, rubs, trace  pedal edema. Abdomen - Soft, non tender, bowel sounds good Neuro - Alert, awake unable to follow commands, bed bound has speech difficulty. Skin - Warm and dry.  Data Reviewed:      Latest Ref Rng & Units 10/07/2023    5:18 AM 10/02/2023    4:29 AM 10/01/2023    1:04 AM  CBC  WBC 4.0 - 10.5 K/uL 6.6  6.9  15.3   Hemoglobin 12.0 - 15.0 g/dL 96.2  95.2  84.1   Hematocrit 36.0 - 46.0 % 37.4  35.2  34.2   Platelets 150 - 400 K/uL 286  240  269       Latest Ref Rng & Units 10/07/2023    5:18 AM 10/03/2023    5:40 AM 10/02/2023    4:29 AM  BMP  Glucose 70 - 99 mg/dL 324   91   BUN 8 - 23 mg/dL 28   16   Creatinine 4.01 - 1.00 mg/dL 0.27   2.53   Sodium 664 - 145 mmol/L 140   139   Potassium 3.5 - 5.1 mmol/L 4.1  4.3  3.2   Chloride 98 - 111 mmol/L 106   108   CO2 22 - 32 mmol/L 27   22   Calcium 8.9 - 10.3 mg/dL 9.2   8.1    No results found.   Disposition: Status is: Observation The patient remains OBS appropriate and will d/c before 2 midnights.  Planned Discharge Destination: STR prior to returning  to her memory care unit      Time spent: 26 minutes  Author: Marcelino Duster, MD 10/07/2023 11:20 AM Secure chat 7am to 7pm For on call review [email or web address].

## 2023-10-07 NOTE — TOC Progression Note (Signed)
Transition of Care [facility]) - Progression Note    Patient Details  Name: Joanna Reed MRN: [id number] Date of Birth: 02-08-1941  Transition of Care [facility]) CM/SW Contact  Bing Quarry, RN Phone Number: 10/07/2023, 1:00 PM  Clinical Narrative:  12/15: Bed offers remain in "consideration" or still pending as of 1 pm today.   Gabriel Cirri MSN RN CM  Care Management Department.  [place]  [facility] Direct Dial: [phone] Main Office Phone: [phone] Weekends Only       Expected Discharge Plan: Home w Home Health Services Barriers to Discharge: Other (must enter comment) (waiting on Diamantina Monks to assess to see if they will allow her to come back)  Expected Discharge Plan and Services   Discharge Planning Services: CM Consult   Living arrangements for the past 2 months: Assisted Living Facility (Memory Care)                                       Social Determinants of Health (SDOH) Interventions SDOH Screenings   Food Insecurity: Patient Unable To Answer (10/02/2023)  Housing: High Risk (10/02/2023)  Transportation Needs: Patient Unable To Answer (10/02/2023)  Utilities: Patient Unable To Answer (10/02/2023)  Tobacco Use: Low Risk  (10/06/2023)    Readmission Risk Interventions     No data to display

## 2023-10-08 DIAGNOSIS — S2241XA Multiple fractures of ribs, right side, initial encounter for closed fracture: Secondary | ICD-10-CM | POA: Diagnosis not present

## 2023-10-08 NOTE — Progress Notes (Signed)
Progress Note   Patient: Joanna Reed [id number]:[id number] DOB: 15-Sep-1941 DOA: 09/30/2023     0 DOS: the patient was seen and examined on 10/08/2023   Brief hospital course: [name] is a 82 y.o. female  with medical history significant for anxiety/depression, recurrent UTI, hypertension, dementia, who presented to the hospital from the nursing facility because of an unwitnessed fall. She was found to have multiple right-sided rib fractures and urinalysis was concerning for urinary tract infection.   She is admitted to the hospitalist service for pain management, IV antibiotic therapy.  Urine cultures grew Proteus, patient finished IV Rocephin total 5 days.  TOC working on [employer] prior to returning to her memory care unit.  Assessment and Plan: Multiple closed fractures of right-sided ribs (6 through 10) s/p fall:  Analgesics as needed for pain. Supportive care.  Falls --PT/OT, SNF rehab    Proteus UTI:  finished IV ceftriaxone for total 5 day course.    Hypokalemia:  --monitor and supplement PRN    Anxiety, depression, dementia:  --cont donepezil, risperidone, sertraline  Out of bed to chair.  Encourage incentive spirometry. TOC working on STIR bed placement. Fall, aspiration precautions. DVT prophylaxis Lovenox   Code Status: Full Code  Subjective:  Pt was verbal but not coherent.    Physical Exam: Vitals:   10/07/23 1534 10/07/23 2202 10/08/23 0932 10/08/23 1811  BP: 139/87 109/69 137/73 128/74  Pulse: 98 70 77 70  Resp: 16 16 13 16   Temp: 98.2 F (36.8 C) (!) 97.5 F (36.4 C) 98.5 F (36.9 C) 98.7 F (37.1 C)  TempSrc:  Oral Oral   SpO2: 96% 100% 96% 97%  Weight:      Height:        Constitutional: NAD, alert, not oriented HEENT: conjunctivae and lids normal, EOMI CV: No cyanosis.   RESP: normal respiratory effort, on RA Extremities: No effusions, edema in BLE SKIN: warm, dry Neuro: II - XII grossly intact.     Data Reviewed:      Latest Ref  Rng & Units 10/07/2023    5:18 AM 10/02/2023    4:29 AM 10/01/2023    1:04 AM  CBC  WBC 4.0 - 10.5 K/uL 6.6  6.9  15.3   Hemoglobin 12.0 - 15.0 g/dL 13.2  44.0  10.2   Hematocrit 36.0 - 46.0 % 37.4  35.2  34.2   Platelets 150 - 400 K/uL 286  240  269       Latest Ref Rng & Units 10/07/2023    5:18 AM 10/03/2023    5:40 AM 10/02/2023    4:29 AM  BMP  Glucose 70 - 99 mg/dL 725   91   BUN 8 - 23 mg/dL 28   16   Creatinine 3.66 - 1.00 mg/dL 4.40   3.47   Sodium 425 - 145 mmol/L 140   139   Potassium 3.5 - 5.1 mmol/L 4.1  4.3  3.2   Chloride 98 - 111 mmol/L 106   108   CO2 22 - 32 mmol/L 27   22   Calcium 8.9 - 10.3 mg/dL 9.2   8.1    No results found.   Disposition: Status is: Observation The patient remains OBS appropriate and will d/c before 2 midnights.  Planned Discharge Destination: STR prior to returning to her memory care unit      Time spent: 35 minutes  Author: Darlin Priestly, MD 10/08/2023 9:03 PM Secure chat 7am to 7pm For on call  review [email or web address].

## 2023-10-08 NOTE — Plan of Care (Signed)

## 2023-10-08 NOTE — TOC Progression Note (Signed)
Transition of Care [facility]) - Progression Note    Patient Details  Name: Joanna Reed MRN: [id number] Date of Birth: 10-18-1941  Transition of Care [facility]) CM/SW Contact  Marlowe Sax, RN Phone Number: 10/08/2023, 11:05 AM  Clinical Narrative:    Expanded the STR Bedsearch  as there are no bed offers locally, will need to go to STR prior to returning to Memory care   Expected Discharge Plan: Home w Home Health Services Barriers to Discharge: Other (must enter comment) (waiting on Diamantina Monks to assess to see if they will allow her to come back)  Expected Discharge Plan and Services   Discharge Planning Services: CM Consult   Living arrangements for the past 2 months: Assisted Living Facility (Memory Care)                                       Social Determinants of Health (SDOH) Interventions SDOH Screenings   Food Insecurity: Patient Unable To Answer (10/02/2023)  Housing: High Risk (10/02/2023)  Transportation Needs: Patient Unable To Answer (10/02/2023)  Utilities: Patient Unable To Answer (10/02/2023)  Tobacco Use: Low Risk  (10/06/2023)    Readmission Risk Interventions     No data to display

## 2023-10-08 NOTE — Plan of Care (Signed)
  Problem: Activity: Goal: Risk for activity intolerance will decrease Outcome: Progressing   Problem: Coping: Goal: Level of anxiety will decrease Outcome: Progressing   Problem: Skin Integrity: Goal: Risk for impaired skin integrity will decrease Outcome: Progressing   

## 2023-10-08 NOTE — TOC Progression Note (Addendum)
Transition of Care [facility]) - Progression Note    Patient Details  Name: Joanna Reed MRN: [id number] Date of Birth: [birth year]  [facility]) CM/SW Contact  Marlowe Sax, RN Phone Number: 10/08/2023, 3:40 PM  Clinical Narrative:    Eber Jones the patient's POA called and let me know that [facility] hall was asking where the patient is, I explained that she is requiring 2 plus assist when last seen by PT and I need to be told by [facility] hall that they can accept her back, PT will see her again and make updated note and I will share with Diamantina Monks, I also explained that if she does need to go to STR that currently we only have one bed offer at [facility] in [place], She asked to wait until PT sees her again and then for me to reach out to Ed at [facility] and provide updated Information to see if they can accept her back Update, late note, late in the day The patien'ts daughter called and I explained that she is a heavy assist plus 2 for all bed mobility and movement, I explained that it is recommended that she go to STR, she stated that she feels the patient would do better at her memory care and that Ed from [place] hall told her that he came to eval the patient and can take her back to [place] hall  Expected Discharge Plan: Home w Home Health Services Barriers to Discharge: Other (must enter comment) (waiting on Diamantina Monks to assess to see if they will allow her to come back)  Expected Discharge Plan and Services   Discharge Planning Services: CM Consult   Living arrangements for the past 2 months: Assisted Living Facility (Memory Care)                                       Social Determinants of Health (SDOH) Interventions SDOH Screenings   Food Insecurity: Patient Unable To Answer (10/02/2023)  Housing: High Risk (10/02/2023)  Transportation Needs: Patient Unable To Answer (10/02/2023)  Utilities: Patient Unable To Answer (10/02/2023)  Tobacco Use: Low Risk   (10/06/2023)    Readmission Risk Interventions     No data to display

## 2023-10-09 DIAGNOSIS — S2241XA Multiple fractures of ribs, right side, initial encounter for closed fracture: Secondary | ICD-10-CM | POA: Diagnosis not present

## 2023-10-09 NOTE — TOC Progression Note (Signed)
Transition of Care [facility]) - Progression Note    Patient Details  Name: Mumina Highbaugh MRN: [id number] Date of Birth: 1941-04-28  Transition of Care [facility]) CM/SW Contact  Marlowe Sax, RN Phone Number: 10/09/2023, 11:42 AM  Clinical Narrative:     Sherron Monday with Eber Jones the POA notified her that [place] [facility] hall is accepting the patient back and we are calling to set up ems  Ems called to arrange transport   Expected Discharge Plan: Home w Home Health Services Barriers to Discharge: Other (must enter comment) (waiting on Diamantina Monks to assess to see if they will allow her to come back)  Expected Discharge Plan and Services   Discharge Planning Services: CM Consult   Living arrangements for the past 2 months: Assisted Living Facility (Memory Care) Expected Discharge Date: 10/09/23                                     Social Determinants of Health (SDOH) Interventions SDOH Screenings   Food Insecurity: Patient Unable To Answer (10/02/2023)  Housing: High Risk (10/02/2023)  Transportation Needs: Patient Unable To Answer (10/02/2023)  Utilities: Patient Unable To Answer (10/02/2023)  Tobacco Use: Low Risk  (10/06/2023)    Readmission Risk Interventions     No data to display

## 2023-10-09 NOTE — TOC Progression Note (Signed)
Transition of Care [facility]) - Progression Note    Patient Details  Name: Joanna Reed MRN: [id number] Date of Birth: [birth year]  Transition of Care [facility]) CM/SW Contact  Marlowe Sax, RN Phone Number: 10/09/2023, 10:34 AM  Clinical Narrative:     Marianna Fuss from [place] hall called and stated that they would accept the patient back today, they requested that Hospice to follow be added to the DC summary, requested a faxed DC summary to [phone]  Expected Discharge Plan: Home w Home Health Services Barriers to Discharge: Other (must enter comment) (waiting on Diamantina Monks to assess to see if they will allow her to come back)  Expected Discharge Plan and Services   Discharge Planning Services: CM Consult   Living arrangements for the past 2 months: Assisted Living Facility (Memory Care)                                       Social Determinants of Health (SDOH) Interventions SDOH Screenings   Food Insecurity: Patient Unable To Answer (10/02/2023)  Housing: High Risk (10/02/2023)  Transportation Needs: Patient Unable To Answer (10/02/2023)  Utilities: Patient Unable To Answer (10/02/2023)  Tobacco Use: Low Risk  (10/06/2023)    Readmission Risk Interventions     No data to display

## 2023-10-09 NOTE — Discharge Summary (Signed)
Physician Discharge Summary   Joanna Reed  female DOB: 1940/11/21  OAC:[id number]  PCP: Doreene Nest, NP  Admit date: 09/30/2023 Discharge date: 10/09/2023  Admitted From: [facility]  Disposition:  [facility] with hospice to follow  CODE STATUS: Full code   Hospital Course:  For full details, please see H&P, progress notes, consult notes and ancillary notes.  Briefly,  Joanna Reed is a 82 y.o. female  with medical history significant for anxiety/depression, recurrent UTI, hypertension, dementia, who presented to the hospital from memory care facility because of an unwitnessed fall.  She was found to have multiple right-sided rib fractures and urinalysis was concerning for urinary tract infection.    Multiple closed fractures of right-sided ribs (6 through 10) due to fall:  --didn't need opioids pain meds prior to discharge.     Falls --PT/OT    Proteus UTI:   finished IV ceftriaxone for total 6 day course.    Hypokalemia:  --monitored and supplemented PRN    Advanced dementia Anxiety, depression --cont donepezil, risperidone, sertraline --hospice to follow after discharge.   Discharge Diagnoses:  Principal Problem:   Multiple closed fractures of ribs of right side Active Problems:   Unwitnessed fall   Urinary tract infection   Anxiety and depression   Dementia without behavioral disturbance [facility])     Discharge Instructions:  Allergies as of 10/09/2023       Reactions   Sulfa Antibiotics         Medication List     STOP taking these medications    furosemide 40 MG tablet Commonly known as: LASIX   LORazepam 0.5 MG tablet Commonly known as: ATIVAN       TAKE these medications    diclofenac Sodium 1 % Gel Commonly known as: VOLTAREN Apply 2 g topically 2 (two) times daily. (Apply to left knee)   docusate sodium 100 MG capsule Commonly known as: COLACE Take 100 mg by mouth daily.   donepezil  10 MG tablet Commonly known as: ARICEPT Take 10 mg by mouth daily.   folic acid 1 MG tablet Commonly known as: FOLVITE Take 1 mg by mouth daily.   multivitamin with minerals Tabs tablet Take 1 tablet by mouth daily.   polyethylene glycol 17 g packet Commonly known as: MIRALAX / GLYCOLAX Take 17 g by mouth in the morning.   pravastatin 10 MG tablet Commonly known as: PRAVACHOL Take 10 mg by mouth at bedtime.   risperiDONE 0.25 MG tablet Commonly known as: RISPERDAL Take 0.25 mg by mouth 2 (two) times daily.   sertraline 100 MG tablet Commonly known as: ZOLOFT Take 200 mg by mouth daily.   zinc oxide 20 % ointment Apply 1 Application topically as needed for irritation. (Apply to buttocks)         Follow-up Information     Doreene Nest, NP Follow up in 1 week(s).   Specialty: Internal Medicine Contact information: [address] [place] [address] [phone]                 Allergies  Allergen Reactions   Sulfa Antibiotics      The results of significant diagnostics from this hospitalization (including imaging, microbiology, ancillary and laboratory) are listed below for reference.   Consultations:   Procedures/Studies: CT CHEST ABDOMEN PELVIS W CONTRAST Result Date: 10/01/2023 CLINICAL DATA:  Unwitnessed fall with known rib fractures, initial encounter EXAM: CT CHEST, ABDOMEN, AND PELVIS WITH  CONTRAST TECHNIQUE: Multidetector CT imaging of the chest, abdomen and pelvis was performed following the standard protocol during bolus administration of intravenous contrast. RADIATION DOSE REDUCTION: This exam was performed according to the departmental dose-optimization program which includes automated exposure control, adjustment of the mA and/or kV according to patient size and/or use of iterative reconstruction technique. CONTRAST:  75mL OMNIPAQUE IOHEXOL 350 MG/ML SOLN COMPARISON:  10/14/2022 FINDINGS: CT CHEST FINDINGS Cardiovascular:  Atherosclerotic calcifications of the thoracic aorta are noted. No aneurysmal dilatation or dissection is seen. Heart is mildly enlarged in size. Pulmonary artery shows no filling defect to suggest pulmonary embolism. Mediastinum/Nodes: Thoracic inlet is within normal limits. No hilar or mediastinal adenopathy is noted. The esophagus as visualized is within normal limits. Lungs/Pleura: Lungs are well aerated bilaterally. No focal infiltrate or sizable effusion is seen. No pneumothorax is noted. Very mild emphysematous changes are seen. Small right-sided pleural effusion is noted. Musculoskeletal: Right lateral rib fractures are noted involving the sixth through tenth ribs without evidence of underlying pneumothorax. Degenerative changes of the thoracic spine are seen. No compression deformity is noted. CT ABDOMEN PELVIS FINDINGS Hepatobiliary: Stable hepatic cyst is noted posteriorly in the right lobe of the liver. The gallbladder is within normal limits. Pancreas: Unremarkable. No pancreatic ductal dilatation or surrounding inflammatory changes. Spleen: Normal in size without focal abnormality. Adrenals/Urinary Tract: Adrenal glands are within normal limits. Kidneys demonstrate a normal enhancement pattern bilaterally. Left renal cyst is noted stable from the prior study. No further follow-up is recommended. No obstructive changes are seen. The bladder is decompressed. Stomach/Bowel: Mild diverticular change of the colon is noted without evidence of diverticulitis. The appendix is within normal limits. Small bowel and stomach are unremarkable. Vascular/Lymphatic: Aortic atherosclerosis. No enlarged abdominal or pelvic lymph nodes. Reproductive: Status post hysterectomy. No adnexal masses. Other: No abdominal wall hernia or abnormality. No abdominopelvic ascites. Musculoskeletal: Degenerative changes are noted without acute abnormality. IMPRESSION: CT of the chest: Multiple right rib fractures as described without  complicating factors. CT of the abdomen and pelvis: Diverticular change without diverticulitis. No acute abnormality noted. Aortic Atherosclerosis (ICD10-I70.0) and Emphysema (ICD10-J43.9). Electronically Signed   By: Alcide Clever M.D.   On: 10/01/2023 02:09   CT Thoracic Spine Wo Contrast Result Date: 10/01/2023 CLINICAL DATA:  Trauma EXAM: CT THORACIC SPINE WITHOUT CONTRAST TECHNIQUE: Multidetector CT images of the thoracic were obtained using the standard protocol without intravenous contrast. RADIATION DOSE REDUCTION: This exam was performed according to the departmental dose-optimization program which includes automated exposure control, adjustment of the mA and/or kV according to patient size and/or use of iterative reconstruction technique. COMPARISON:  None Available. FINDINGS: Alignment: Normal thoracic kyphosis. Vertebrae: No acute fracture or focal pathologic process. Paraspinal and other soft tissues: Thoracic aortic atherosclerosis. Trace right pleural effusion. Nondisplaced right lateral 6 through 8th rib fractures (series 3/images 48, 57, and 67). No pneumothorax. 3.0 cm posterior right hepatic lobe cyst, benign. Disc levels: Mild degenerative changes, most prominent at T12-L1. Spinal canal is patent. IMPRESSION: No traumatic injury to the thoracic spine. Nondisplaced right lateral 6 through 8th rib fractures. Trace right pleural effusion. No pneumothorax. Electronically Signed   By: Charline Bills M.D.   On: 10/01/2023 00:05   CT Lumbar Spine Wo Contrast Result Date: 10/01/2023 CLINICAL DATA:  Trauma EXAM: CT LUMBAR SPINE WITHOUT CONTRAST TECHNIQUE: Multidetector CT imaging of the lumbar spine was performed without intravenous contrast administration. Multiplanar CT image reconstructions were also generated. RADIATION DOSE REDUCTION: This exam was performed according to the  departmental dose-optimization program which includes automated exposure control, adjustment of the mA and/or kV  according to patient size and/or use of iterative reconstruction technique. COMPARISON:  CT abdomen/pelvis dated 10/14/2022 FINDINGS: Segmentation: 5 lumbar type vertebral bodies. Alignment: Normal lumbar lordosis. Vertebrae: No acute fracture or focal pathologic process. Paraspinal and other soft tissues: Trace right pleural effusion. 17 mm simple left upper pole renal cyst (series 4/image 31), benign (Bosniak I). No follow-up is recommended. Posterior right hepatic cyst, incompletely visualized. Vascular calcifications. Disc levels: Mild multilevel degenerative changes, most prominent at T12-L1 and L5-S1. Spinal canal is patent. IMPRESSION: No traumatic injury to the lumbar spine. Mild multilevel degenerative changes. Electronically Signed   By: Charline Bills M.D.   On: 10/01/2023 00:02   CT HEAD WO CONTRAST ( ) Result Date: 10/01/2023 CLINICAL DATA:  Trauma EXAM: CT HEAD WITHOUT CONTRAST CT CERVICAL SPINE WITHOUT CONTRAST TECHNIQUE: Multidetector CT imaging of the head and cervical spine was performed following the standard protocol without intravenous contrast. Multiplanar CT image reconstructions of the cervical spine were also generated. RADIATION DOSE REDUCTION: This exam was performed according to the departmental dose-optimization program which includes automated exposure control, adjustment of the mA and/or kV according to patient size and/or use of iterative reconstruction technique. COMPARISON:  01/11/2023 FINDINGS: CT HEAD FINDINGS Brain: No evidence of acute infarction, hemorrhage, hydrocephalus, extra-axial collection or mass lesion/mass effect. Global cortical and central atrophy. Secondary ventricular prominence. Subcortical white matter and periventricular small vessel ischemic changes. Vascular: No hyperdense vessel or unexpected calcification. Skull: Normal. Negative for fracture or focal lesion. Sinuses/Orbits: The visualized paranasal sinuses are essentially clear. The mastoid air  cells are unopacified. Other: None. CT CERVICAL SPINE FINDINGS Alignment: Normal cervical lordosis. Skull base and vertebrae: No acute fracture. No primary bone lesion or focal pathologic process. Soft tissues and spinal canal: No prevertebral fluid or swelling. No visible canal hematoma. Disc levels: Mild degenerative changes of the mid/lower cervical spine. Spinal canal is patent. Upper chest: Visualized lung apices are clear. Other: Visualized thyroid is unremarkable. IMPRESSION: No acute intracranial abnormality. Atrophy with small vessel ischemic changes. No traumatic injury to the cervical spine. Mild degenerative changes. Electronically Signed   By: Charline Bills M.D.   On: 10/01/2023 00:01   CT Cervical Spine Wo Contrast Result Date: 10/01/2023 CLINICAL DATA:  Trauma EXAM: CT HEAD WITHOUT CONTRAST CT CERVICAL SPINE WITHOUT CONTRAST TECHNIQUE: Multidetector CT imaging of the head and cervical spine was performed following the standard protocol without intravenous contrast. Multiplanar CT image reconstructions of the cervical spine were also generated. RADIATION DOSE REDUCTION: This exam was performed according to the departmental dose-optimization program which includes automated exposure control, adjustment of the mA and/or kV according to patient size and/or use of iterative reconstruction technique. COMPARISON:  01/11/2023 FINDINGS: CT HEAD FINDINGS Brain: No evidence of acute infarction, hemorrhage, hydrocephalus, extra-axial collection or mass lesion/mass effect. Global cortical and central atrophy. Secondary ventricular prominence. Subcortical white matter and periventricular small vessel ischemic changes. Vascular: No hyperdense vessel or unexpected calcification. Skull: Normal. Negative for fracture or focal lesion. Sinuses/Orbits: The visualized paranasal sinuses are essentially clear. The mastoid air cells are unopacified. Other: None. CT CERVICAL SPINE FINDINGS Alignment: Normal cervical  lordosis. Skull base and vertebrae: No acute fracture. No primary bone lesion or focal pathologic process. Soft tissues and spinal canal: No prevertebral fluid or swelling. No visible canal hematoma. Disc levels: Mild degenerative changes of the mid/lower cervical spine. Spinal canal is patent. Upper chest: Visualized lung apices are clear. Other:  Visualized thyroid is unremarkable. IMPRESSION: No acute intracranial abnormality. Atrophy with small vessel ischemic changes. No traumatic injury to the cervical spine. Mild degenerative changes. Electronically Signed   By: Charline Bills M.D.   On: 10/01/2023 00:01      Labs: BNP (last 3 results) No results for input(s): "BNP" in the last 8760 hours. Basic Metabolic Panel: Recent Labs  Lab 10/03/23 0540 10/07/23 0518  NA  --  140  K 4.3 4.1  CL  --  106  CO2  --  27  GLUCOSE  --  105*  BUN  --  28*  CREATININE  --  0.69  CALCIUM  --  9.2  MG 2.3  --    Liver Function Tests: No results for input(s): "AST", "ALT", "ALKPHOS", "BILITOT", "PROT", "ALBUMIN" in the last 168 hours. No results for input(s): "LIPASE", "AMYLASE" in the last 168 hours. No results for input(s): "AMMONIA" in the last 168 hours. CBC: Recent Labs  Lab 10/07/23 0518  WBC 6.6  HGB 12.6  HCT 37.4  MCV 87.4  PLT 286   Cardiac Enzymes: No results for input(s): "CKTOTAL", "CKMB", "CKMBINDEX", "TROPONINI" in the last 168 hours. BNP: Invalid input(s): "POCBNP" CBG: No results for input(s): "GLUCAP" in the last 168 hours. D-Dimer No results for input(s): "DDIMER" in the last 72 hours. Hgb A1c No results for input(s): "HGBA1C" in the last 72 hours. Lipid Profile No results for input(s): "CHOL", "HDL", "LDLCALC", "TRIG", "CHOLHDL", "LDLDIRECT" in the last 72 hours. Thyroid function studies No results for input(s): "TSH", "T4TOTAL", "T3FREE", "THYROIDAB" in the last 72 hours.  Invalid input(s): "FREET3" Anemia work up No results for input(s): "VITAMINB12",  "FOLATE", "FERRITIN", "TIBC", "IRON", "RETICCTPCT" in the last 72 hours. Urinalysis    Component Value Date/Time   COLORURINE YELLOW 10/01/2023 0051   APPEARANCEUR CLEAR 10/01/2023 0051   LABSPEC 1.020 10/01/2023 0051   PHURINE 7.5 10/01/2023 0051   GLUCOSEU NEGATIVE 10/01/2023 0051   HGBUR LARGE (A) 10/01/2023 0051   BILIRUBINUR NEGATIVE 10/01/2023 0051   KETONESUR NEGATIVE 10/01/2023 0051   PROTEINUR 100 (A) 10/01/2023 0051   NITRITE POSITIVE (A) 10/01/2023 0051   LEUKOCYTESUR LARGE (A) 10/01/2023 0051   Sepsis Labs Recent Labs  Lab 10/07/23 0518  WBC 6.6   Microbiology Recent Results (from the past 240 hours)  Urine Culture     Status: Abnormal   Collection Time: 10/01/23 12:51 AM   Specimen: Urine, Catheterized  Result Value Ref Range Status   Specimen Description   Final    URINE, CATHETERIZED Performed at [facility], [address], [place], [place] [address]    Special Requests   Final    NONE Performed at [facility], [address] [place], [place] [address]    Culture 80,000 COLONIES/mL PROTEUS MIRABILIS (A)  Final   Report Status 10/03/2023 FINAL  Final   Organism ID, Bacteria PROTEUS MIRABILIS (A)  Final      Susceptibility   Proteus mirabilis - MIC*    AMPICILLIN <=2 SENSITIVE Sensitive     CEFAZOLIN 8 SENSITIVE Sensitive     CEFEPIME <=0.12 SENSITIVE Sensitive     CEFTRIAXONE <=0.25 SENSITIVE Sensitive     CIPROFLOXACIN >=4 RESISTANT Resistant     GENTAMICIN <=1 SENSITIVE Sensitive     IMIPENEM 2 SENSITIVE Sensitive     NITROFURANTOIN 128 RESISTANT Resistant     TRIMETH/SULFA >=320 RESISTANT Resistant     AMPICILLIN/SULBACTAM <=2 SENSITIVE Sensitive     PIP/TAZO <=4 SENSITIVE Sensitive ug/mL    *  80,000 COLONIES/mL PROTEUS MIRABILIS     Total time spend on discharging this patient, including the last patient exam, discussing the hospital stay, instructions for ongoing care as it relates to all pertinent caregivers, as well  as preparing the medical discharge records, prescriptions, and/or referrals as applicable, is 30 minutes.    Darlin Priestly, MD  [facility] 10/09/2023, 11:17 AM

## 2023-10-09 NOTE — Plan of Care (Signed)

## 2023-10-09 NOTE — Progress Notes (Signed)
Report called to [facility], [facility] Director. AVS and discharge paperwork sent in packet with EMS.

## 2023-10-09 NOTE — Progress Notes (Signed)
Physical Therapy Treatment Patient Details Name: Joanna Reed MRN: [id number] DOB: 07-05-1941 Today's Date: 10/09/2023   History of Present Illness Pt is an 82 y.o. F with medical history significant for anxiety/depression, recurrent UTI, HTN, and dementia who presents to ED from facility following an unwitnessed fall, resulting in closed fractures of R ribs 6, 7, 8.    PT Comments  Pt resting in bed upon PT arrival; pt able to state first and last name and engage in occasional brief conversation but overall appearing very confused; pt intermittently reading headlines on television; inconsistent with following 1 step cues. During session pt max assist with bed mobility and unable to come to full stand with 2 assist and UE support.  Pt appearing intermittently mildly agitated but able to be redirected during session.  TOC updated on pt's status.  Will continue to focus on strengthening and progressive functional mobility per pt tolerance.   If plan is discharge home, recommend the following: Two people to help with walking and/or transfers;A lot of help with bathing/dressing/bathroom;Assistance with cooking/housework;Assistance with feeding;Direct supervision/assist for medications management;Direct supervision/assist for financial management;Assist for transportation;Help with stairs or ramp for entrance;Supervision due to cognitive status   Can travel by private vehicle      No  Equipment Recommendations  BSC/3in1;Wheelchair (measurements PT);Wheelchair cushion (measurements PT);Hospital bed;Hoyer lift    Recommendations for Other Services       Precautions / Restrictions Precautions Precautions: Fall Precaution Comments: closed R rib fractures 6, 7, 8 Restrictions Weight Bearing Restrictions Per Provider Order: No     Mobility  Bed Mobility Overal bed mobility: Needs Assistance Bed Mobility: Supine to Sit, Sit to Supine     Supine to sit: Max assist, HOB elevated Sit to  supine: Max assist, HOB elevated   General bed mobility comments: assist for trunk and B LE's    Transfers Overall transfer level: Needs assistance Equipment used: Rolling walker (2 wheels), 2 person hand held assist Transfers: Sit to/from Stand Sit to Stand: Max assist, +2 physical assistance           General transfer comment: x2 trials attempting to stand with RW use; x1 trial attempting to stand with B hand hold assist; pt only able to come to about 1/2 stand each trial (pt appearing fearful)    Ambulation/Gait               General Gait Details: unable to fully stand to attempt   Stairs             Wheelchair Mobility     Tilt Bed    Modified Rankin (Stroke Patients Only)       Balance Overall balance assessment: Needs assistance Sitting-balance support: No upper extremity supported, Feet supported Sitting balance-Leahy Scale: Fair Sitting balance - Comments: CGA for safety but pt steady sitting on EOB   Standing balance support: Bilateral upper extremity supported, Reliant on assistive device for balance Standing balance-Leahy Scale: Poor Standing balance comment: 2 assist with posterior lean noted in standing                            Cognition Arousal: Alert Behavior During Therapy: Restless, Anxious (Appearing mildly agitated intermittently) Overall Cognitive Status: No family/caregiver present to determine baseline cognitive functioning Area of Impairment: Orientation, Awareness, Following commands                 Orientation Level: Person (Able to state first and last  name (pt did not answer any other orientation questions))             General Comments: Pt reading some headlines off of television; occasionally briefly engaging in conversation        Exercises      General Comments        Pertinent Vitals/Pain Pain Assessment Pain Assessment: Faces Faces Pain Scale: Hurts little more (0/10 at rest; 4/10  with activity) Pain Location: R ribs Pain Descriptors / Indicators: Discomfort, Sore Pain Intervention(s): Limited activity within patient's tolerance, Monitored during session, Repositioned Vitals (HR and SpO2 on room air) stable and WFL throughout treatment session.    Home Living                          Prior Function            PT Goals (current goals can now be found in the care plan section) Acute Rehab PT Goals PT Goal Formulation: Patient unable to participate in goal setting Progress towards PT goals: Progressing toward goals (unable to progress to ambulation today)    Frequency    Min 1X/week      PT Plan      Co-evaluation              AM-PAC PT "6 Clicks" Mobility   Outcome Measure  Help needed turning from your back to your side while in a flat bed without using bedrails?: A Little Help needed moving from lying on your back to sitting on the side of a flat bed without using bedrails?: A Lot Help needed moving to and from a bed to a chair (including a wheelchair)?: Total Help needed standing up from a chair using your arms (e.g., wheelchair or bedside chair)?: Total Help needed to walk in hospital room?: Total Help needed climbing 3-5 steps with a railing? : Total 6 Click Score: 9    End of Session Equipment Utilized During Treatment: Gait belt Activity Tolerance: Other (comment) (Limited d/t cognition and weakness) Patient left: in bed;with call bell/phone within reach;with bed alarm set Nurse Communication: Mobility status;Precautions (via whiteboard) PT Visit Diagnosis: Muscle weakness (generalized) (M62.81);Unsteadiness on feet (R26.81);Pain Pain - Right/Left: Right Pain - part of body:  (ribs)     Time: 1610-9604 PT Time Calculation (min) (ACUTE ONLY): 23 min  Charges:    $Therapeutic Activity: 23-37 mins PT General Charges $$ ACUTE PT VISIT: 1 Visit                     Hendricks Limes, PT 10/09/23, 12:50 PM

## 2023-10-09 NOTE — Plan of Care (Signed)
  Problem: Clinical Measurements: Goal: Will remain free from infection Outcome: Progressing Goal: Diagnostic test results will improve Outcome: Progressing   Problem: Coping: Goal: Level of anxiety will decrease Outcome: Progressing   Problem: Skin Integrity: Goal: Risk for impaired skin integrity will decrease Outcome: Progressing

## 2023-10-19 ENCOUNTER — Inpatient Hospital Stay: Payer: Medicare PPO | Admitting: Primary Care

## 2023-11-22 ENCOUNTER — Emergency Department: Payer: Medicare PPO

## 2023-11-22 ENCOUNTER — Observation Stay
Admission: EM | Admit: 2023-11-22 | Discharge: 2023-11-23 | Disposition: A | Discharge status: Skilled Nursing Facility | Payer: Medicare PPO | Attending: Internal Medicine | Admitting: Internal Medicine

## 2023-11-22 ENCOUNTER — Other Ambulatory Visit: Payer: Self-pay

## 2023-11-22 ENCOUNTER — Encounter: Payer: Self-pay | Admitting: Emergency Medicine

## 2023-11-22 DIAGNOSIS — R4182 Altered mental status, unspecified: Principal | ICD-10-CM

## 2023-11-22 DIAGNOSIS — Z1152 Encounter for screening for COVID-19: Secondary | ICD-10-CM | POA: Insufficient documentation

## 2023-11-22 DIAGNOSIS — E785 Hyperlipidemia, unspecified: Secondary | ICD-10-CM | POA: Insufficient documentation

## 2023-11-22 DIAGNOSIS — N3000 Acute cystitis without hematuria: Secondary | ICD-10-CM

## 2023-11-22 DIAGNOSIS — Z9889 Other specified postprocedural states: Secondary | ICD-10-CM | POA: Insufficient documentation

## 2023-11-22 DIAGNOSIS — F0390 Unspecified dementia without behavioral disturbance: Secondary | ICD-10-CM | POA: Diagnosis not present

## 2023-11-22 DIAGNOSIS — G9341 Metabolic encephalopathy: Secondary | ICD-10-CM | POA: Diagnosis not present

## 2023-11-22 DIAGNOSIS — F32A Depression, unspecified: Secondary | ICD-10-CM | POA: Diagnosis present

## 2023-11-22 DIAGNOSIS — Z66 Do not resuscitate: Secondary | ICD-10-CM | POA: Insufficient documentation

## 2023-11-22 DIAGNOSIS — Z79899 Other long term (current) drug therapy: Secondary | ICD-10-CM | POA: Diagnosis not present

## 2023-11-22 DIAGNOSIS — D649 Anemia, unspecified: Secondary | ICD-10-CM | POA: Diagnosis not present

## 2023-11-22 DIAGNOSIS — I10 Essential (primary) hypertension: Secondary | ICD-10-CM

## 2023-11-22 DIAGNOSIS — J101 Influenza due to other identified influenza virus with other respiratory manifestations: Principal | ICD-10-CM | POA: Insufficient documentation

## 2023-11-22 DIAGNOSIS — I639 Cerebral infarction, unspecified: Secondary | ICD-10-CM | POA: Diagnosis present

## 2023-11-22 LAB — URINALYSIS, ROUTINE W REFLEX MICROSCOPIC
Bilirubin Urine: NEGATIVE
Glucose, UA: NEGATIVE mg/dL
Ketones, ur: NEGATIVE mg/dL
Leukocytes,Ua: NEGATIVE
Nitrite: NEGATIVE
Protein, ur: 100 mg/dL — AB
Specific Gravity, Urine: 1.026 (ref 1.005–1.030)
pH: 5 (ref 5.0–8.0)

## 2023-11-22 LAB — CBC
HCT: 36.9 % (ref 36.0–46.0)
Hemoglobin: 12 g/dL (ref 12.0–15.0)
MCH: 29.3 pg (ref 26.0–34.0)
MCHC: 32.5 g/dL (ref 30.0–36.0)
MCV: 90 fL (ref 80.0–100.0)
Platelets: 212 10*3/uL (ref 150–400)
RBC: 4.1 MIL/uL (ref 3.87–5.11)
RDW: 12.9 % (ref 11.5–15.5)
WBC: 4.6 10*3/uL (ref 4.0–10.5)
nRBC: 0 % (ref 0.0–0.2)

## 2023-11-22 LAB — COMPREHENSIVE METABOLIC PANEL
ALT: 18 U/L (ref 0–44)
AST: 28 U/L (ref 15–41)
Albumin: 3.8 g/dL (ref 3.5–5.0)
Alkaline Phosphatase: 57 U/L (ref 38–126)
Anion gap: 10 (ref 5–15)
BUN: 21 mg/dL (ref 8–23)
CO2: 26 mmol/L (ref 22–32)
Calcium: 8.6 mg/dL — ABNORMAL LOW (ref 8.9–10.3)
Chloride: 106 mmol/L (ref 98–111)
Creatinine, Ser: 0.63 mg/dL (ref 0.44–1.00)
GFR, Estimated: >60 mL/min (ref >60)
Glucose, Bld: 95 mg/dL (ref 70–99)
Potassium: 3.5 mmol/L (ref 3.5–5.1)
Sodium: 142 mmol/L (ref 135–145)
Total Bilirubin: 0.4 mg/dL (ref 0.0–1.2)
Total Protein: 7.6 g/dL (ref 6.5–8.1)

## 2023-11-22 LAB — RESP PANEL BY RT-PCR (RSV, FLU A&B, COVID)  RVPGX2
Influenza A by PCR: POSITIVE — AB
Influenza B by PCR: NEGATIVE
Resp Syncytial Virus by PCR: NEGATIVE
SARS Coronavirus 2 by RT PCR: NEGATIVE

## 2023-11-22 LAB — DIFFERENTIAL
Abs Immature Granulocytes: 0.03 10*3/uL (ref 0.00–0.07)
Basophils Absolute: 0 10*3/uL (ref 0.0–0.1)
Basophils Relative: 1 %
Eosinophils Absolute: 0 10*3/uL (ref 0.0–0.5)
Eosinophils Relative: 0 %
Immature Granulocytes: 1 %
Lymphocytes Relative: 10 %
Lymphs Abs: 0.5 10*3/uL — ABNORMAL LOW (ref 0.7–4.0)
Monocytes Absolute: 0.7 10*3/uL (ref 0.1–1.0)
Monocytes Relative: 15 %
Neutro Abs: 3.4 10*3/uL (ref 1.7–7.7)
Neutrophils Relative %: 73 %

## 2023-11-22 LAB — PROTIME-INR
INR: 1.1 (ref 0.8–1.2)
Prothrombin Time: 14.4 s (ref 11.4–15.2)

## 2023-11-22 LAB — APTT: aPTT: 38 s — ABNORMAL HIGH (ref 24–36)

## 2023-11-22 LAB — ETHANOL: Alcohol, Ethyl (B): <10 mg/dL (ref <10)

## 2023-11-22 MED ORDER — SODIUM CHLORIDE 0.9 % IV SOLN
1.0000 g | Freq: Once | INTRAVENOUS | Status: AC
  Administered 2023-11-23: 1 g via INTRAVENOUS
  Filled 2023-11-22: qty 10

## 2023-11-22 MED ORDER — SODIUM CHLORIDE 0.9 % IV BOLUS
1000.0000 mL | Freq: Once | INTRAVENOUS | Status: AC
  Administered 2023-11-23: 1000 mL via INTRAVENOUS

## 2023-11-22 MED ORDER — SODIUM CHLORIDE 0.9% FLUSH: 3.0000 mL | Freq: Once | INTRAVENOUS | Status: DC

## 2023-11-22 NOTE — ED Provider Notes (Signed)
[facility] Provider Note    Event Date/Time   First MD Initiated Contact with Patient 11/22/23 2106     (approximate)   History   Chief Complaint Altered Mental Status   HPI  Joanna Reed is a 83 y.o. female with past medical history of hypertension, anemia, and dementia who presents to the ED for altered mental status.  Per EMS, staff at patient's memory care facility noticed a change in her mental status over the past 24 hours.  They reported increased slurred speech as well as patient being increasingly sleepy and slumped to the right.  Patient unable to provide any history upon arrival, I did speak with her emergency contact over the phone, who stated that patient was walking with a walker as recently as last week.     Physical Exam   Triage Vital Signs: ED Triage Vitals [11/22/23 1538]  Encounter Vitals Group     BP 136/72     Systolic BP Percentile      Diastolic BP Percentile      Pulse Rate 76     Resp 16     Temp 99.3 F (37.4 C)     Temp Source Oral     SpO2 100 %     Weight 110 lb (49.9 kg)     Height 5' (1.524 m)     Head Circumference      Peak Flow      Pain Score      Pain Loc      Pain Education      Exclude from Growth Chart     Most recent vital signs: Vitals:   11/22/23 2114 11/22/23 2251  BP:  94/82  Pulse: 69 (!) 58  Resp: 15 15  Temp:  99.1 F (37.3 C)  SpO2: 99% 93%    Constitutional: Alert and oriented to person, but not place, time, or situation. Eyes: Conjunctivae are normal. Head: Atraumatic. Nose: No congestion/rhinnorhea. Mouth/Throat: Mucous membranes are moist.  Neck: No midline cervical spine tenderness to palpation. Cardiovascular: Normal rate, regular rhythm. Grossly normal heart sounds.  2+ radial pulses bilaterally. Respiratory: Normal respiratory effort.  No retractions. Lungs CTAB. Gastrointestinal: Soft and nontender. No distention. Musculoskeletal: No lower extremity tenderness nor  edema.  Neurologic: Slurred and incoherent speech noted.  Global weakness with no gross focal neurologic deficits appreciated.    ED Results / Procedures / Treatments   Labs (all labs ordered are listed, but only abnormal results are displayed) Labs Reviewed  APTT - Abnormal; Notable for the following components:      Result Value   aPTT 38 (*)    All other components within normal limits  DIFFERENTIAL - Abnormal; Notable for the following components:   Lymphs Abs 0.5 (*)    All other components within normal limits  COMPREHENSIVE METABOLIC PANEL - Abnormal; Notable for the following components:   Calcium 8.6 (*)    All other components within normal limits  RESP PANEL BY RT-PCR (RSV, FLU A&B, COVID)  RVPGX2  PROTIME-INR  CBC  ETHANOL  URINALYSIS, ROUTINE W REFLEX MICROSCOPIC  CBG MONITORING, ED     EKG  ED ECG REPORT I, Chesley Noon, the attending physician, personally viewed and interpreted this ECG.   Date: 11/22/2023  EKG Time: 15:43  Rate: 74  Rhythm: normal sinus rhythm  Axis: Normal  Intervals:none  ST&T Change: None  RADIOLOGY CT head reviewed and interpreted by me with no hemorrhage or midline shift.  PROCEDURES:  Critical Care performed: No  Procedures   MEDICATIONS ORDERED IN ED: Medications  sodium chloride flush (NS) 0.9 % injection 3 mL (3 mLs Intravenous Not Given 11/22/23 1643)     IMPRESSION / MDM / ASSESSMENT AND PLAN / ED COURSE  I reviewed the triage vital signs and the nursing notes.                              83 y.o. female with past medical history of hypertension, anemia, and dementia who presents to the ED with increasing confusion and weakness over the past 24 hours.  Patient's presentation is most consistent with acute presentation with potential threat to life or bodily function.  Differential diagnosis includes, but is not limited to, stroke, TIA, UTI, anemia, electrolyte abnormality, AKI, dementia.  Patient  nontoxic-appearing and in no acute distress, vital signs are unremarkable.  EKG shows no evidence of arrhythmia or ischemia, patient somnolent but arousable to voice, does have slurred speech and appears generally weak but with no apparent focal neurologic deficits.  CT head is negative for acute process, chest x-ray also unremarkable.  Labs with no significant anemia, leukocytosis, electrolyte abnormality, or AKI.  Urinalysis pending at this time, her medical POA reports history of frequent UTIs with similar presentation.  Medical POA does report that this is a significant change from her baseline as patient was ambulating with a walker last week, is typically more communicative.  Case discussed with hospitalist for admission, if urinalysis unremarkable then she will likely need MRI to rule out acute stroke.      FINAL CLINICAL IMPRESSION(S) / ED DIAGNOSES   Final diagnoses:  Altered mental status, unspecified altered mental status type     Rx / DC Orders   ED Discharge Orders     None        Note:  This document was prepared using Dragon voice recognition software and may include unintentional dictation errors.   Chesley Noon, MD 11/22/23 [phone]

## 2023-11-22 NOTE — ED Triage Notes (Signed)
Arrives from [facility] via [facility].  EMS called for slurred speech and not acting right. Last seen normal 1630 yesterday.  VS wnl  110/60 90 165 CBG 96% RA  STroke screen negative.   Hx dementia, patient at baseline.

## 2023-11-22 NOTE — ED Provider Triage Note (Signed)
Emergency Medicine Provider Triage Evaluation Note  Joanna Reed , a 83 y.o. female  was evaluated in triage.  Per report, patient not acting herself. Last known well 4:30pm yesterday. Patient unable to verbalize complaint.  Physical Exam  There were no vitals taken for this visit. Gen:   Awake, no distress   Resp:  Normal effort  MSK:   Moves extremities without difficulty  Other:  Mumbling, incomprehensible   Medical Decision Making  Medically screening exam initiated at 3:35 PM.  Appropriate orders placed.  Joanna Reed was informed that the remainder of the evaluation will be completed by another provider, this initial triage assessment does not replace that evaluation, and the importance of remaining in the ED until their evaluation is complete.  Labs and head CT ordered.   Chinita Pester, FNP 11/22/23 [phone]

## 2023-11-22 NOTE — ED Triage Notes (Signed)
Pt to ED from [facility]. Per First nurse note pt sent over for altered mental status and slurred speech. Pt is not able to provide any history. Pt is talking but not making sense, speech does not appear to be slurred. Pt does have a runny nose. Pt is not able to follow commands. Pt has a hx/o dementia.

## 2023-11-23 DIAGNOSIS — J101 Influenza due to other identified influenza virus with other respiratory manifestations: Principal | ICD-10-CM | POA: Diagnosis present

## 2023-11-23 DIAGNOSIS — I10 Essential (primary) hypertension: Secondary | ICD-10-CM

## 2023-11-23 LAB — CBC WITH DIFFERENTIAL/PLATELET
Abs Immature Granulocytes: 0.01 10*3/uL (ref 0.00–0.07)
Basophils Absolute: 0 10*3/uL (ref 0.0–0.1)
Basophils Relative: 1 %
Eosinophils Absolute: 0 10*3/uL (ref 0.0–0.5)
Eosinophils Relative: 0 %
HCT: 32.4 % — ABNORMAL LOW (ref 36.0–46.0)
Hemoglobin: 10.6 g/dL — ABNORMAL LOW (ref 12.0–15.0)
Immature Granulocytes: 0 %
Lymphocytes Relative: 24 %
Lymphs Abs: 0.9 10*3/uL (ref 0.7–4.0)
MCH: 29.2 pg (ref 26.0–34.0)
MCHC: 32.7 g/dL (ref 30.0–36.0)
MCV: 89.3 fL (ref 80.0–100.0)
Monocytes Absolute: 0.4 10*3/uL (ref 0.1–1.0)
Monocytes Relative: 12 %
Neutro Abs: 2.3 10*3/uL (ref 1.7–7.7)
Neutrophils Relative %: 63 %
Platelets: 168 10*3/uL (ref 150–400)
RBC: 3.63 MIL/uL — ABNORMAL LOW (ref 3.87–5.11)
RDW: 13 % (ref 11.5–15.5)
WBC: 3.7 10*3/uL — ABNORMAL LOW (ref 4.0–10.5)
nRBC: 0 % (ref 0.0–0.2)

## 2023-11-23 LAB — BASIC METABOLIC PANEL
Anion gap: 10 (ref 5–15)
BUN: 20 mg/dL (ref 8–23)
CO2: 25 mmol/L (ref 22–32)
Calcium: 8.1 mg/dL — ABNORMAL LOW (ref 8.9–10.3)
Chloride: 108 mmol/L (ref 98–111)
Creatinine, Ser: 0.61 mg/dL (ref 0.44–1.00)
GFR, Estimated: >60 mL/min (ref >60)
Glucose, Bld: 89 mg/dL (ref 70–99)
Potassium: 3.4 mmol/L — ABNORMAL LOW (ref 3.5–5.1)
Sodium: 143 mmol/L (ref 135–145)

## 2023-11-23 MED ORDER — ONDANSETRON HCL 4 MG/2ML IJ SOLN: 4.0000 mg | Freq: Four times a day (QID) | INTRAMUSCULAR | Status: DC | PRN

## 2023-11-23 MED ORDER — ZINC OXIDE 20 % EX OINT: 1.0000 | TOPICAL_OINTMENT | CUTANEOUS | Status: DC | PRN

## 2023-11-23 MED ORDER — PRAVASTATIN SODIUM 20 MG PO TABS: 10.0000 mg | ORAL_TABLET | Freq: Every day | ORAL | Status: DC

## 2023-11-23 MED ORDER — OSELTAMIVIR PHOSPHATE 30 MG PO CAPS: 30.0000 mg | ORAL_CAPSULE | Freq: Two times a day (BID) | ORAL | 0 refills | Status: AC

## 2023-11-23 MED ORDER — RISPERIDONE 0.25 MG PO TABS
0.2500 mg | ORAL_TABLET | Freq: Two times a day (BID) | ORAL | Status: DC
  Filled 2023-11-23: qty 1

## 2023-11-23 MED ORDER — POLYETHYLENE GLYCOL 3350 17 G PO PACK
17.0000 g | PACK | Freq: Every morning | ORAL | Status: DC
  Filled 2023-11-23: qty 1

## 2023-11-23 MED ORDER — ADULT MULTIVITAMIN W/MINERALS CH
1.0000 | ORAL_TABLET | Freq: Every day | ORAL | Status: DC
  Filled 2023-11-23: qty 1

## 2023-11-23 MED ORDER — SERTRALINE HCL 50 MG PO TABS
200.0000 mg | ORAL_TABLET | Freq: Every day | ORAL | Status: DC
  Filled 2023-11-23: qty 4

## 2023-11-23 MED ORDER — DONEPEZIL HCL 5 MG PO TABS
10.0000 mg | ORAL_TABLET | Freq: Every day | ORAL | Status: DC
  Filled 2023-11-23: qty 2

## 2023-11-23 MED ORDER — LOPERAMIDE HCL 2 MG PO CAPS: 4.0000 mg | ORAL_CAPSULE | ORAL | Status: DC | PRN

## 2023-11-23 MED ORDER — POTASSIUM CHLORIDE CRYS ER 20 MEQ PO TBCR
40.0000 meq | EXTENDED_RELEASE_TABLET | Freq: Once | ORAL | Status: DC
  Filled 2023-11-23: qty 2

## 2023-11-23 MED ORDER — OSELTAMIVIR PHOSPHATE 30 MG PO CAPS
30.0000 mg | ORAL_CAPSULE | Freq: Two times a day (BID) | ORAL | Status: DC
  Filled 2023-11-23: qty 1

## 2023-11-23 MED ORDER — FOLIC ACID 1 MG PO TABS
1.0000 mg | ORAL_TABLET | Freq: Every day | ORAL | Status: DC
  Filled 2023-11-23: qty 1

## 2023-11-23 MED ORDER — ONDANSETRON HCL 4 MG PO TABS: 4.0000 mg | ORAL_TABLET | Freq: Four times a day (QID) | ORAL | Status: DC | PRN

## 2023-11-23 MED ORDER — ALBUTEROL SULFATE (2.5 MG/3ML) 0.083% IN NEBU: 2.5000 mg | INHALATION_SOLUTION | RESPIRATORY_TRACT | Status: DC | PRN

## 2023-11-23 MED ORDER — DOCUSATE SODIUM 100 MG PO CAPS
100.0000 mg | ORAL_CAPSULE | Freq: Every day | ORAL | Status: DC
  Filled 2023-11-23: qty 1

## 2023-11-23 MED ORDER — ENOXAPARIN SODIUM 40 MG/0.4ML IJ SOSY
40.0000 mg | PREFILLED_SYRINGE | INTRAMUSCULAR | Status: DC
  Filled 2023-11-23: qty 0.4

## 2023-11-23 MED ORDER — DICLOFENAC SODIUM 1 % EX GEL
2.0000 g | Freq: Two times a day (BID) | CUTANEOUS | Status: DC
  Filled 2023-11-23: qty 100

## 2023-11-23 MED ORDER — HYDRALAZINE HCL 20 MG/ML IJ SOLN: 5.0000 mg | INTRAMUSCULAR | Status: DC | PRN

## 2023-11-23 MED ORDER — ACETAMINOPHEN 325 MG PO TABS: 650.0000 mg | ORAL_TABLET | Freq: Four times a day (QID) | ORAL | Status: AC | PRN

## 2023-11-23 MED ORDER — ACETAMINOPHEN 325 MG PO TABS: 650.0000 mg | ORAL_TABLET | Freq: Four times a day (QID) | ORAL | Status: DC | PRN

## 2023-11-23 MED ORDER — ACETAMINOPHEN 325 MG RE SUPP: 650.0000 mg | Freq: Four times a day (QID) | RECTAL | Status: DC | PRN

## 2023-11-23 NOTE — ED Notes (Signed)
[facility]  CALLED  FOR  TRANSPORT TO  [facility] 

## 2023-11-23 NOTE — Assessment & Plan Note (Signed)
Following admission high BP is being recorded but without history of hypertension As needed hydralazine and continue to monitor Might need antihypertensive at discharge

## 2023-11-23 NOTE — H&P (Signed)
History and Physical    Patient: Joanna Reed WUJ:[id number] DOB: Feb 15, 1941 DOA: 11/22/2023 DOS: the patient was seen and examined on 11/23/2023 PCP: Doreene Nest, NP  Patient coming from: Home  Chief Complaint:  Chief Complaint  Patient presents with   Altered Mental Status    HPI: Joanna Reed is a 83 y.o. female with medical history significant for Hypertension and dementia, ambulant with cane at bedside who presents to the ED with altered mental status from baseline.  Patient was noted to be sleeping a lot and slumped to the right and there was concern for slurred speech.  History limited due to dementia. ED course and data review: Low-grade temp 99.3, bradycardia in the high 50s, BP, O2 sats normal Workup notable for the following influenza A+ CBC normal, CMP unremarkable.  Urinalysis with rare bacteria but negative leukocyte esterase and nitrite EKG, personally viewed and interpreted showing NSR at 74 Chest x-ray with no acute disease Head CT nonacute but does show chronic sphenoid sinus disease  Patient treated with an NS bolus and started on ceftriaxone Hospitalist consulted for admission    Past Medical History:  Diagnosis Date   Anxiety and depression    Dementia (HCC)    Past Surgical History:  Procedure Laterality Date   ABDOMINAL HYSTERECTOMY     1980   Social History:  reports that she has never smoked. She has never used smokeless tobacco. She reports that she does not currently use alcohol. She reports that she does not use drugs.  Allergies  Allergen Reactions   Sulfa Antibiotics     Family History  Problem Relation Age of Onset   Arthritis Mother    Heart disease Father        Deceased from MI   Stroke Maternal Grandmother 70   Arthritis Maternal Grandfather     Prior to Admission medications   Medication Sig Start Date End Date Taking? Authorizing Provider  diclofenac Sodium (VOLTAREN) 1 % GEL Apply 2 g topically 2 (two) times  daily. (Apply to left knee)    [provider]  docusate sodium (COLACE) 100 MG capsule Take 100 mg by mouth daily.    [provider]  donepezil (ARICEPT) 10 MG tablet Take 10 mg by mouth daily.    [provider]  folic acid (FOLVITE) 1 MG tablet Take 1 mg by mouth daily.    [provider]  Multiple Vitamin (MULTIVITAMIN WITH MINERALS) TABS tablet Take 1 tablet by mouth daily.    [provider]  polyethylene glycol (MIRALAX / GLYCOLAX) 17 g packet Take 17 g by mouth in the morning.    [provider]  pravastatin (PRAVACHOL) 10 MG tablet Take 10 mg by mouth at bedtime.    [provider]  risperiDONE (RISPERDAL) 0.25 MG tablet Take 0.25 mg by mouth 2 (two) times daily.    [provider]  sertraline (ZOLOFT) 100 MG tablet Take 200 mg by mouth daily.    [provider]  zinc oxide 20 % ointment Apply 1 Application topically as needed for irritation. (Apply to buttocks)    [provider]    Physical Exam: Vitals:   11/23/23 0000 11/23/23 0030 11/23/23 0130 11/23/23 0205  BP: 109/65 136/74 (!) 152/63 (!) 150/101  Pulse: (!) 56 (!) 59 64 (!) 59  Resp: 17 17 13 19   Temp:    97.9 F (36.6 C)  TempSrc:    Axillary  SpO2: 97% 97% 95% 99%  Weight:  Height:       Physical Exam Vitals and nursing note reviewed.  Constitutional:      General: She is not in acute distress. HENT:     Head: Normocephalic and atraumatic.  Cardiovascular:     Rate and Rhythm: Regular rhythm. Bradycardia present.     Heart sounds: Normal heart sounds.  Pulmonary:     Effort: Pulmonary effort is normal.     Breath sounds: Normal breath sounds.  Abdominal:     Palpations: Abdomen is soft.     Tenderness: There is no abdominal tenderness.  Neurological:     General: No focal deficit present.     Mental Status: She is lethargic.     Labs on Admission: I have personally reviewed following labs and imaging  studies  CBC: Recent Labs  Lab 11/22/23 1554  WBC 4.6  NEUTROABS 3.4  HGB 12.0  HCT 36.9  MCV 90.0  PLT 212   Basic Metabolic Panel: Recent Labs  Lab 11/22/23 1554  NA 142  K 3.5  CL 106  CO2 26  GLUCOSE 95  BUN 21  CREATININE 0.63  CALCIUM 8.6*   GFR: Estimated Creatinine Clearance: 38.3 mL/min (by C-G formula based on SCr of 0.63 mg/dL). Liver Function Tests: Recent Labs  Lab 11/22/23 1554  AST 28  ALT 18  ALKPHOS 57  BILITOT 0.4  PROT 7.6  ALBUMIN 3.8   No results for input(s): "LIPASE", "AMYLASE" in the last 168 hours. No results for input(s): "AMMONIA" in the last 168 hours. Coagulation Profile: Recent Labs  Lab 11/22/23 1554  INR 1.1   Cardiac Enzymes: No results for input(s): "CKTOTAL", "CKMB", "CKMBINDEX", "TROPONINI" in the last 168 hours. BNP (last 3 results) No results for input(s): "PROBNP" in the last 8760 hours. HbA1C: No results for input(s): "HGBA1C" in the last 72 hours. CBG: No results for input(s): "GLUCAP" in the last 168 hours. Lipid Profile: No results for input(s): "CHOL", "HDL", "LDLCALC", "TRIG", "CHOLHDL", "LDLDIRECT" in the last 72 hours. Thyroid Function Tests: No results for input(s): "TSH", "T4TOTAL", "FREET4", "T3FREE", "THYROIDAB" in the last 72 hours. Anemia Panel: No results for input(s): "VITAMINB12", "FOLATE", "FERRITIN", "TIBC", "IRON", "RETICCTPCT" in the last 72 hours. Urine analysis:    Component Value Date/Time   COLORURINE AMBER (A) 11/22/2023 2252   APPEARANCEUR CLOUDY (A) 11/22/2023 2252   LABSPEC 1.026 11/22/2023 2252   PHURINE 5.0 11/22/2023 2252   GLUCOSEU NEGATIVE 11/22/2023 2252   HGBUR LARGE (A) 11/22/2023 2252   BILIRUBINUR NEGATIVE 11/22/2023 2252   KETONESUR NEGATIVE 11/22/2023 2252   PROTEINUR 100 (A) 11/22/2023 2252   NITRITE NEGATIVE 11/22/2023 2252   LEUKOCYTESUR NEGATIVE 11/22/2023 2252    Radiological Exams on Admission: DG Chest Portable 1 View Result Date: 11/22/2023 CLINICAL  DATA:  Weakness and altered mental status EXAM: PORTABLE CHEST 1 VIEW COMPARISON:  Chest x-ray 10/14/2022 FINDINGS: The heart size and mediastinal contours are within normal limits. Both lungs are clear. The visualized skeletal structures are unremarkable. IMPRESSION: No active disease. Electronically Signed   By: Darliss Cheney M.D.   On: 11/22/2023 22:48   CT Head Wo Contrast Result Date: 11/22/2023 CLINICAL DATA:  Mental status change, unknown cause EXAM: CT HEAD WITHOUT CONTRAST TECHNIQUE: Contiguous axial images were obtained from the base of the skull through the vertex without intravenous contrast. RADIATION DOSE REDUCTION: This exam was performed according to the departmental dose-optimization program which includes automated exposure control, adjustment of the mA and/or kV according to patient size and/or use  of iterative reconstruction technique. COMPARISON:  CT head 09/30/2023 FINDINGS: Brain: Patchy and confluent areas of decreased attenuation are noted throughout the deep and periventricular white matter of the cerebral hemispheres bilaterally, compatible with chronic microvascular ischemic disease. No evidence of large-territorial acute infarction. No parenchymal hemorrhage. No mass lesion. No extra-axial collection. No mass effect or midline shift. No hydrocephalus. Basilar cisterns are patent. Vascular: No hyperdense vessel. Skull: No acute fracture or focal lesion. Sinuses/Orbits: Bilateral sphenoid mucosal thickening. Associated hypertrophy of the sphenoid sinus wall. Paranasal sinuses and mastoid air cells are clear. The orbits are unremarkable. Other: None. IMPRESSION: 1. No acute intracranial abnormality. 2. 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 Electronically Signed   By: Tish Frederickson M.D.   On: 11/22/2023 17:38     Data Reviewed: Relevant notes from primary care and specialist visits, past discharge summaries as available in EHR, including Care Everywhere. Prior  diagnostic testing as pertinent to current admission diagnoses Updated medications and problem lists for reconciliation ED course, including vitals, labs, imaging, treatment and response to treatment Triage notes, nursing and pharmacy notes and ED provider's notes Notable results as noted in HPI   Assessment and Plan: * Influenza A Acute metabolic encephalopathy No reported respiratory symptoms.  Patient has a low-grade temp of 99.3 and influenza A positive Tamiflu Neurologic checks with fall and aspiration precautions Supportive care   Primary hypertension Not noted to be on antihypertensives Hydralazine as needed pending med rec  Anemia Continue Folvite  Dementia without behavioral disturbance (HCC) Continue Aricept and Risperdal Delirium precautions  Anxiety and depression Continue sertraline     DVT prophylaxis: Lovenox  Consults: none  Advance Care Planning:   Code Status: Prior   Family Communication: none  Disposition Plan: Back to previous home environment  Severity of Illness: The appropriate patient status for this patient is INPATIENT. Inpatient status is judged to be reasonable and necessary in order to provide the required intensity of service to ensure the patient's safety. The patient's presenting symptoms, physical exam findings, and initial radiographic and laboratory data in the context of their chronic comorbidities is felt to place them at high risk for further clinical deterioration. Furthermore, it is not anticipated that the patient will be medically stable for discharge from the hospital within 2 midnights of admission.   * I certify that at the point of admission it is my clinical judgment that the patient will require inpatient hospital care spanning beyond 2 midnights from the point of admission due to high intensity of service, high risk for further deterioration and high frequency of surveillance required.*  Author: Andris Baumann,  MD 11/23/2023 2:56 AM  For on call review [email or web address].

## 2023-11-23 NOTE — Care Management CC44 (Signed)
Condition Code 44 Documentation Completed  Patient Details  Name: Joanna Reed MRN: [id number] Date of Birth: 1941/09/28   Condition Code 44 given:  Yes Patient signature on Condition Code 44 notice:  Yes Documentation of 2 MD's agreement:  Yes Code 44 added to claim:  Yes    Tasheena Wambolt E Kacia Halley, LCSW 11/23/2023, 10:44 AM

## 2023-11-23 NOTE — Assessment & Plan Note (Signed)
Continue Folvite

## 2023-11-23 NOTE — ED Notes (Signed)
Called [place] and spoke to [place] to give report [phone]

## 2023-11-23 NOTE — Discharge Summary (Signed)
Physician Discharge Summary   Patient: Joanna Reed MRN: [id number] DOB: 1941/09/21  Admit date:     11/22/2023  Discharge date: 11/23/23  Discharge Physician: Jonah Blue   PCP: Doreene Nest, NP   Recommendations at discharge:    You are being discharged back to your memory care facility. You will need to quarantine for Influenza A as per facility protocols. Tamiflu is ordered; continue until gone unless side effects develop  Follow up with NP Clark in 1-2 weeks  Discharge Diagnoses: Principal Problem:   Influenza A Active Problems:   Acute metabolic encephalopathy   Primary hypertension   Anxiety and depression   Dementia without behavioral disturbance (HCC)   Anemia   Hospital Course: 82yo with h/o HTN and dementia who presented from memory care on 1/30 with AMS.  She was found to have Influenza A.    Assessment and Plan:  Influenza A No reported respiratory symptoms.  Patient has a low-grade temp of 99.3 and influenza A positive Tamiflu ordered Continue fall and aspiration precautions Supportive care There are no current indications for ongoing hospitalization at this time so will dc back to facility   Acute metabolic encephalopathy  Negative head CT and UA, although urine culture is pending Likely associated with influenza A and would anticipated to improve with treatment Complicated by baseline advanced dementia  Primary hypertension Not noted to be on antihypertensives   Anemia Continue Folvite   Dementia with anxiety/depression Continue Aricept, Risperdal, and sertraline Delirium precautions  HLD Continue pravastatin for now, but given low utility and increased risk for worsening cognition in this scenario would consider discontinuation   DNR Confirmed with family Will send gold out-of-facility DNR form back with patient to her facility    Consultants: None  Procedures: None  Antibiotics: Ceftriaxone x 1   30 Day Unplanned  Readmission Risk Score    Flowsheet Row ED to Hosp-Admission (Current) from 11/22/2023 in [facility] Department at [facility]  30 Day Unplanned Readmission Risk Score (%) 15.03 Filed at 11/23/2023 0801       This score is the patient's risk of an unplanned readmission within 30 days of being discharged (0 -100%). The score is based on dignosis, age, lab data, medications, orders, and past utilization.   Low:  0-14.9   Medium: 15-21.9   High: 22-29.9   Extreme: 30 and above           Pain control - [place] Controlled Substance Reporting System database was reviewed. and patient was instructed, not to drive, operate heavy machinery, perform activities at heights, swimming or participation in water activities or provide baby-sitting services while on Pain, Sleep and Anxiety Medications; until their outpatient Physician has advised to do so again. Also recommended to not to take more than prescribed Pain, Sleep and Anxiety Medications.    Disposition: Nursing home Diet recommendation:  Regular diet DISCHARGE MEDICATION: Allergies as of 11/23/2023       Reactions   Sulfa Antibiotics         Medication List     TAKE these medications    acetaminophen 325 MG tablet Commonly known as: TYLENOL Take 2 tablets (650 mg total) by mouth every 6 (six) hours as needed for mild pain (pain score 1-3) (or Fever >/= 101).   diclofenac Sodium 1 % Gel Commonly known as: VOLTAREN Apply 2 g topically 2 (two) times daily. (Apply to left knee)   docusate sodium 100 MG capsule Commonly known as: COLACE Take  100 mg by mouth daily.   donepezil 10 MG tablet Commonly known as: ARICEPT Take 10 mg by mouth daily.   folic acid 1 MG tablet Commonly known as: FOLVITE Take 1 mg by mouth daily.   loperamide 2 MG capsule Commonly known as: IMODIUM Take 4 mg by mouth as needed for diarrhea or loose stools.   multivitamin with minerals Tabs tablet Take 1 tablet by mouth  daily.   oseltamivir 30 MG capsule Commonly known as: TAMIFLU Take 1 capsule (30 mg total) by mouth 2 (two) times daily for 5 days.   polyethylene glycol 17 g packet Commonly known as: MIRALAX / GLYCOLAX Take 17 g by mouth in the morning.   pravastatin 10 MG tablet Commonly known as: PRAVACHOL Take 10 mg by mouth at bedtime.   risperiDONE 0.25 MG tablet Commonly known as: RISPERDAL Take 0.25 mg by mouth 2 (two) times daily. 0800/1600   sertraline 100 MG tablet Commonly known as: ZOLOFT Take 200 mg by mouth daily.   zinc oxide 20 % ointment Apply 1 Application topically as needed for irritation. (Apply to buttocks)        Discharge Exam:   Subjective: She was unable to provide history, spoke but mostly nonsensical speech.  I spoke with her friend/NOK (daughter lives in [place]).  The facility told her that she was leaning to the R with sitting more than usual.  For about a year, she has been leaning to the R with sitting but this was worse.  Her speech was a little more slurred than usual.  At baseline, some conversation is understandable and other times she mumbles.  Anytime she has these issues, it is usually due to a UTI.  She wants to be a DNR.     Objective: Vitals:   11/23/23 0747 11/23/23 0749  BP:  137/63  Pulse:  (!) 54  Resp:  17  Temp:    SpO2: 99% 99%   No intake or output data in the 24 hours ending 11/23/23 1003 Filed Weights   11/22/23 1538  Weight: 49.9 kg    Exam:  General:  Appears calm and comfortable and is in NAD Eyes:  normal lids, iris ENT:  grossly normal hearing, lips & tongue, mmm Neck:  no LAD, masses or thyromegaly Cardiovascular:  RRR, no m/r/g. No LE edema.  Respiratory:   CTA bilaterally with no wheezes/rales/rhonchi.  Normal respiratory effort. Abdomen:  soft, NT, ND Skin:  no rash or induration seen on limited exam Musculoskeletal:  no bony abnormality Psychiatric:  blunted mood and affect, speech fluent but mostly  nonsensical Neurologic:  CN 2-12 grossly intact, no gross abnormalities on very limited exam  Data Reviewed: I have reviewed the patient's lab results since admission.  Pertinent labs for today include:  K+ 3.4 WBC 3.7 Hgb 10.6 Influenza positive UA: large LE, 100 protein Urine culture pending     Condition at discharge: fair  The results of significant diagnostics from this hospitalization (including imaging, microbiology, ancillary and laboratory) are listed below for reference.   Imaging Studies: DG Chest Portable 1 View Result Date: 11/22/2023 CLINICAL DATA:  Weakness and altered mental status EXAM: PORTABLE CHEST 1 VIEW COMPARISON:  Chest x-ray 10/14/2022 FINDINGS: The heart size and mediastinal contours are within normal limits. Both lungs are clear. The visualized skeletal structures are unremarkable. IMPRESSION: No active disease. Electronically Signed   By: Darliss Cheney M.D.   On: 11/22/2023 22:48   CT Head Wo Contrast Result Date:  11/22/2023 CLINICAL DATA:  Mental status change, unknown cause EXAM: CT HEAD WITHOUT CONTRAST TECHNIQUE: Contiguous axial images were obtained from the base of the skull through the vertex without intravenous contrast. RADIATION DOSE REDUCTION: This exam was performed according to the departmental dose-optimization program which includes automated exposure control, adjustment of the mA and/or kV according to patient size and/or use of iterative reconstruction technique. COMPARISON:  CT head 09/30/2023 FINDINGS: Brain: Patchy and confluent areas of decreased attenuation are noted throughout the deep and periventricular white matter of the cerebral hemispheres bilaterally, compatible with chronic microvascular ischemic disease. No evidence of large-territorial acute infarction. No parenchymal hemorrhage. No mass lesion. No extra-axial collection. No mass effect or midline shift. No hydrocephalus. Basilar cisterns are patent. Vascular: No hyperdense vessel.  Skull: No acute fracture or focal lesion. Sinuses/Orbits: Bilateral sphenoid mucosal thickening. Associated hypertrophy of the sphenoid sinus wall. Paranasal sinuses and mastoid air cells are clear. The orbits are unremarkable. Other: None. IMPRESSION: 1. No acute intracranial abnormality. 2. 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 Electronically Signed   By: Tish Frederickson M.D.   On: 11/22/2023 17:38    Microbiology: Results for orders placed or performed during the hospital encounter of 11/22/23  Resp panel by RT-PCR (RSV, Flu A&B, Covid) Anterior Nasal Swab     Status: Abnormal   Collection Time: 11/22/23 10:52 PM   Specimen: Anterior Nasal Swab  Result Value Ref Range Status   SARS Coronavirus 2 by RT PCR NEGATIVE NEGATIVE Final    Comment: (NOTE) SARS-CoV-2 target nucleic acids are NOT DETECTED.  The SARS-CoV-2 RNA is generally detectable in upper respiratory specimens during the acute phase of infection. The lowest concentration of SARS-CoV-2 viral copies this assay can detect is 138 copies/mL. A negative result does not preclude SARS-Cov-2 infection and should not be used as the sole basis for treatment or other patient management decisions. A negative result may occur with  improper specimen collection/handling, submission of specimen other than nasopharyngeal swab, presence of viral mutation(s) within the areas targeted by this assay, and inadequate number of viral copies(<138 copies/mL). A negative result must be combined with clinical observations, patient history, and epidemiological information. The expected result is Negative.  Fact Sheet for Patients:  BloggerCourse.com  Fact Sheet for Healthcare Providers:  SeriousBroker.it  This test is no t yet approved or cleared by the [place] FDA and  has been authorized for detection and/or diagnosis of SARS-CoV-2 by FDA under an Emergency Use Authorization  (EUA). This EUA will remain  in effect (meaning this test can be used) for the duration of the COVID-19 declaration under Section 564(b)(1) of the Act, 21 U.S.C.section 360bbb-3(b)(1), unless the authorization is terminated  or revoked sooner.       Influenza A by PCR POSITIVE (A) NEGATIVE Final   Influenza B by PCR NEGATIVE NEGATIVE Final    Comment: (NOTE) The Xpert Xpress SARS-CoV-2/FLU/RSV plus assay is intended as an aid in the diagnosis of influenza from Nasopharyngeal swab specimens and should not be used as a sole basis for treatment. Nasal washings and aspirates are unacceptable for Xpert Xpress SARS-CoV-2/FLU/RSV testing.  Fact Sheet for Patients: BloggerCourse.com  Fact Sheet for Healthcare Providers: SeriousBroker.it  This test is not yet approved or cleared by the [place] FDA and has been authorized for detection and/or diagnosis of SARS-CoV-2 by FDA under an Emergency Use Authorization (EUA). This EUA will remain in effect (meaning this test can be used) for the duration of the COVID-19 declaration  under Section 564(b)(1) of the Act, 21 U.S.C. section 360bbb-3(b)(1), unless the authorization is terminated or revoked.     Resp Syncytial Virus by PCR NEGATIVE NEGATIVE Final    Comment: (NOTE) Fact Sheet for Patients: BloggerCourse.com  Fact Sheet for Healthcare Providers: SeriousBroker.it  This test is not yet approved or cleared by the [place] FDA and has been authorized for detection and/or diagnosis of SARS-CoV-2 by FDA under an Emergency Use Authorization (EUA). This EUA will remain in effect (meaning this test can be used) for the duration of the COVID-19 declaration under Section 564(b)(1) of the Act, 21 U.S.C. section 360bbb-3(b)(1), unless the authorization is terminated or revoked.  Performed at [facility], [address]  [place], [place] [address]     Labs: CBC: Recent Labs  Lab 11/22/23 1554 11/23/23 0854  WBC 4.6 3.7*  NEUTROABS 3.4 2.3  HGB 12.0 10.6*  HCT 36.9 32.4*  MCV 90.0 89.3  PLT 212 168   Basic Metabolic Panel: Recent Labs  Lab 11/22/23 1554 11/23/23 0854  NA 142 143  K 3.5 3.4*  CL 106 108  CO2 26 25  GLUCOSE 95 89  BUN 21 20  CREATININE 0.63 0.61  CALCIUM 8.6* 8.1*   Liver Function Tests: Recent Labs  Lab 11/22/23 1554  AST 28  ALT 18  ALKPHOS 57  BILITOT 0.4  PROT 7.6  ALBUMIN 3.8   CBG: No results for input(s): "GLUCAP" in the last 168 hours.  Discharge time spent: greater than 30 minutes.  Signed: Jonah Blue, MD [facility] 11/23/2023

## 2023-11-23 NOTE — Assessment & Plan Note (Signed)
 Continue sertraline

## 2023-11-23 NOTE — Progress Notes (Signed)
PHARMACY NOTE:  ANTIMICROBIAL RENAL DOSAGE ADJUSTMENT  Current antimicrobial regimen includes a mismatch between antimicrobial dosage and estimated renal function.  As per policy approved by the Pharmacy & Therapeutics and Medical Executive Committees, the antimicrobial dosage will be adjusted accordingly.  Current antimicrobial dosage:  Tamiflu 75 mg PO BID X 5 days   Indication: flu   Renal Function:  Estimated Creatinine Clearance: 38.3 mL/min (by C-G formula based on SCr of 0.63 mg/dL). []      On intermittent HD, scheduled: []      On CRRT    Antimicrobial dosage has been changed to:  Tamiflu 30 mg PO BID X 5 days   Additional comments:   Thank you for allowing pharmacy to be a part of this patient's care.  Laneta Guerin D, [facility] 11/23/2023 3:11 AM

## 2023-11-23 NOTE — Assessment & Plan Note (Signed)
Continue Aricept and Risperdal Delirium precautions

## 2023-11-23 NOTE — ED Notes (Addendum)
Pt is asleep upon assessment. Equal rise and fall of chest noted. CB within reach, VS WDL, bed locked and in lowest position.

## 2023-11-23 NOTE — ED Notes (Signed)
Pt soiled brief w/ urine. Pt cleaned and changed into new brief.

## 2023-11-23 NOTE — TOC Initial Note (Signed)
Transition of Care [facility]) - Initial/Assessment Note    Patient Details  Name: Joanna Reed MRN: [id number] Date of Birth: May 09, 1941  [facility] CM/SW Contact:    Liliana Cline, LCSW Phone Number: 11/23/2023, 10:34 AM  Clinical Narrative:                 Patient is from [facility] CSW spoke with POA Eber Jones who confirms plan for patient to return there today. [place] of [place] ([place] Called Ed at [employer] - left him a VM. Called [address] main line - spoke to [employer]. Asher Muir states they do not need an FL2 or anything else. Asher Muir confirmed the [employer] is correct. Asher Muir states patient needs EMS transport.   Expected Discharge Plan: Memory Care Barriers to Discharge: Barriers Resolved   Patient Goals and CMS Choice Patient states their goals for this hospitalization and ongoing recovery are:: return to [email or web address].gov Compare Post Acute Care list provided to:: Patient Represenative (must comment) Choice offered to / list presented to : [facility] POA / Guardian      Expected Discharge Plan and Services       Living arrangements for the past 2 months: Assisted Living Facility Expected Discharge Date: 11/23/23                                    Prior Living Arrangements/Services Living arrangements for the past 2 months: Assisted Living Facility Lives with:: Facility Resident Patient language and need for interpreter reviewed:: Yes Do you feel safe going back to the place where you live?: Yes      Need for Family Participation in Patient Care: Yes (Comment) Care giver support system in place?: Yes (comment)   Criminal Activity/Legal Involvement Pertinent to Current Situation/Hospitalization: No - Comment as needed  Activities of Daily Living      Permission Sought/Granted Permission sought to share information with : Facility Industrial/product designer granted to share information with : Yes,  Verbal Permission Granted              Emotional Assessment       Orientation: : Fluctuating Orientation (Suspected and/or reported Sundowners) Alcohol / Substance Use: Not Applicable Psych Involvement: No (comment)  Admission diagnosis:  Influenza A [J10.1] Patient Active Problem List   Diagnosis Date Noted   Influenza A 11/23/2023   Primary hypertension 11/23/2023   Multiple closed fractures of ribs of right side 10/01/2023   Unwitnessed fall 10/01/2023   AMS (altered mental status) 09/15/2022   Anemia 09/15/2022   Generalized anxiety disorder 12/31/2020   Dysthymia 12/31/2020   Dementia without behavioral disturbance (HCC) 12/31/2020   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 [place] [address] Phone: [phone] Fax: [phone]     Social Drivers of Health (SDOH) Social History: SDOH Screenings   Food Insecurity: Patient Unable To Answer (10/02/2023)  Housing: High Risk (10/02/2023)  Transportation Needs: Patient Unable To Answer (10/02/2023)  Utilities: Patient Unable To Answer (10/02/2023)  Tobacco Use: Low Risk  (11/22/2023)   SDOH Interventions:     Readmission Risk Interventions     No data to display

## 2023-11-23 NOTE — Care Management Obs Status (Signed)
MEDICARE OBSERVATION STATUS NOTIFICATION   Patient Details  Name: Joanna Reed MRN: [id number] Date of Birth: [birth year]   Medicare Observation Status Notification Given:  Yes    Rozanne Heumann E [name], LCSW 11/23/2023, 10:44 AM

## 2023-11-23 NOTE — Assessment & Plan Note (Signed)
Not noted to be on antihypertensives Hydralazine as needed pending med rec

## 2023-11-23 NOTE — Assessment & Plan Note (Signed)
Acute metabolic encephalopathy No reported respiratory symptoms.  Patient has a low-grade temp of 99.3 and influenza A positive Tamiflu Neurologic checks with fall and aspiration precautions Supportive care

## 2023-11-26 LAB — URINE CULTURE: Culture: >=100000 — AB

## 2023-12-18 ENCOUNTER — Other Ambulatory Visit: Payer: Self-pay

## 2023-12-18 ENCOUNTER — Emergency Department: Payer: Medicare PPO

## 2023-12-18 ENCOUNTER — Inpatient Hospital Stay
Admission: EM | Admit: 2023-12-18 | Discharge: 2023-12-21 | DRG: 193 | Disposition: A | Discharge status: Skilled Nursing Facility | Payer: Medicare PPO | Source: Skilled Nursing Facility | Attending: Internal Medicine | Admitting: Internal Medicine

## 2023-12-18 DIAGNOSIS — Z823 Family history of stroke: Secondary | ICD-10-CM

## 2023-12-18 DIAGNOSIS — E876 Hypokalemia: Secondary | ICD-10-CM | POA: Diagnosis present

## 2023-12-18 DIAGNOSIS — Z8261 Family history of arthritis: Secondary | ICD-10-CM | POA: Diagnosis not present

## 2023-12-18 DIAGNOSIS — J181 Lobar pneumonia, unspecified organism: Principal | ICD-10-CM | POA: Diagnosis present

## 2023-12-18 DIAGNOSIS — E785 Hyperlipidemia, unspecified: Secondary | ICD-10-CM | POA: Diagnosis present

## 2023-12-18 DIAGNOSIS — Z882 Allergy status to sulfonamides status: Secondary | ICD-10-CM | POA: Diagnosis not present

## 2023-12-18 DIAGNOSIS — D75839 Thrombocytosis, unspecified: Secondary | ICD-10-CM | POA: Diagnosis present

## 2023-12-18 DIAGNOSIS — Z66 Do not resuscitate: Secondary | ICD-10-CM | POA: Diagnosis present

## 2023-12-18 DIAGNOSIS — F419 Anxiety disorder, unspecified: Secondary | ICD-10-CM | POA: Diagnosis present

## 2023-12-18 DIAGNOSIS — F0394 Unspecified dementia, unspecified severity, with anxiety: Secondary | ICD-10-CM | POA: Diagnosis present

## 2023-12-18 DIAGNOSIS — R531 Weakness: Secondary | ICD-10-CM | POA: Diagnosis present

## 2023-12-18 DIAGNOSIS — F03918 Unspecified dementia, unspecified severity, with other behavioral disturbance: Secondary | ICD-10-CM | POA: Diagnosis not present

## 2023-12-18 DIAGNOSIS — G9341 Metabolic encephalopathy: Secondary | ICD-10-CM | POA: Diagnosis present

## 2023-12-18 DIAGNOSIS — F0393 Unspecified dementia, unspecified severity, with mood disturbance: Secondary | ICD-10-CM | POA: Diagnosis present

## 2023-12-18 DIAGNOSIS — A419 Sepsis, unspecified organism: Secondary | ICD-10-CM | POA: Diagnosis not present

## 2023-12-18 DIAGNOSIS — Z8249 Family history of ischemic heart disease and other diseases of the circulatory system: Secondary | ICD-10-CM | POA: Diagnosis not present

## 2023-12-18 DIAGNOSIS — J189 Pneumonia, unspecified organism: Secondary | ICD-10-CM | POA: Diagnosis not present

## 2023-12-18 DIAGNOSIS — E870 Hyperosmolality and hypernatremia: Secondary | ICD-10-CM | POA: Diagnosis present

## 2023-12-18 DIAGNOSIS — E871 Hypo-osmolality and hyponatremia: Secondary | ICD-10-CM | POA: Diagnosis present

## 2023-12-18 DIAGNOSIS — Z79899 Other long term (current) drug therapy: Secondary | ICD-10-CM

## 2023-12-18 DIAGNOSIS — F32A Depression, unspecified: Secondary | ICD-10-CM | POA: Diagnosis present

## 2023-12-18 LAB — URINALYSIS, ROUTINE W REFLEX MICROSCOPIC
Bilirubin Urine: NEGATIVE
Glucose, UA: NEGATIVE mg/dL
Hgb urine dipstick: NEGATIVE
Ketones, ur: NEGATIVE mg/dL
Leukocytes,Ua: NEGATIVE
Nitrite: NEGATIVE
Protein, ur: NEGATIVE mg/dL
Specific Gravity, Urine: 1.027 (ref 1.005–1.030)
pH: 5 (ref 5.0–8.0)

## 2023-12-18 LAB — COMPREHENSIVE METABOLIC PANEL
ALT: 16 U/L (ref 0–44)
AST: 16 U/L (ref 15–41)
Albumin: 3.3 g/dL — ABNORMAL LOW (ref 3.5–5.0)
Alkaline Phosphatase: 65 U/L (ref 38–126)
Anion gap: 11 (ref 5–15)
BUN: 33 mg/dL — ABNORMAL HIGH (ref 8–23)
CO2: 27 mmol/L (ref 22–32)
Calcium: 8.9 mg/dL (ref 8.9–10.3)
Chloride: 116 mmol/L — ABNORMAL HIGH (ref 98–111)
Creatinine, Ser: 0.79 mg/dL (ref 0.44–1.00)
GFR, Estimated: >60 mL/min (ref >60)
Glucose, Bld: 100 mg/dL — ABNORMAL HIGH (ref 70–99)
Potassium: 3.2 mmol/L — ABNORMAL LOW (ref 3.5–5.1)
Sodium: 154 mmol/L — ABNORMAL HIGH (ref 135–145)
Total Bilirubin: 0.4 mg/dL (ref 0.0–1.2)
Total Protein: 7.2 g/dL (ref 6.5–8.1)

## 2023-12-18 LAB — CBC WITH DIFFERENTIAL/PLATELET
Abs Immature Granulocytes: 0.22 10*3/uL — ABNORMAL HIGH (ref 0.00–0.07)
Basophils Absolute: 0.1 10*3/uL (ref 0.0–0.1)
Basophils Relative: 1 %
Eosinophils Absolute: 0 10*3/uL (ref 0.0–0.5)
Eosinophils Relative: 0 %
HCT: 36.9 % (ref 36.0–46.0)
Hemoglobin: 11.5 g/dL — ABNORMAL LOW (ref 12.0–15.0)
Immature Granulocytes: 2 %
Lymphocytes Relative: 13 %
Lymphs Abs: 2 10*3/uL (ref 0.7–4.0)
MCH: 28.6 pg (ref 26.0–34.0)
MCHC: 31.2 g/dL (ref 30.0–36.0)
MCV: 91.8 fL (ref 80.0–100.0)
Monocytes Absolute: 0.9 10*3/uL (ref 0.1–1.0)
Monocytes Relative: 6 %
Neutro Abs: 11.9 10*3/uL — ABNORMAL HIGH (ref 1.7–7.7)
Neutrophils Relative %: 78 %
Platelets: 449 10*3/uL — ABNORMAL HIGH (ref 150–400)
RBC: 4.02 MIL/uL (ref 3.87–5.11)
RDW: 13.2 % (ref 11.5–15.5)
WBC: 15.1 10*3/uL — ABNORMAL HIGH (ref 4.0–10.5)
nRBC: 0 % (ref 0.0–0.2)

## 2023-12-18 LAB — RESP PANEL BY RT-PCR (RSV, FLU A&B, COVID)  RVPGX2
Influenza A by PCR: NEGATIVE
Influenza B by PCR: NEGATIVE
Resp Syncytial Virus by PCR: NEGATIVE
SARS Coronavirus 2 by RT PCR: NEGATIVE

## 2023-12-18 LAB — LACTIC ACID, PLASMA
Lactic Acid, Venous: 1.2 mmol/L (ref 0.5–1.9)
Lactic Acid, Venous: 1 mmol/L (ref 0.5–1.9)

## 2023-12-18 MED ORDER — SODIUM CHLORIDE 0.9 % IV SOLN
500.0000 mg | Freq: Once | INTRAVENOUS | Status: AC
  Administered 2023-12-18: 500 mg via INTRAVENOUS
  Filled 2023-12-18: qty 5

## 2023-12-18 MED ORDER — SODIUM CHLORIDE 0.9 % IV SOLN
1.0000 g | Freq: Once | INTRAVENOUS | Status: AC
  Administered 2023-12-18: 1 g via INTRAVENOUS
  Filled 2023-12-18: qty 10

## 2023-12-18 MED ORDER — SODIUM CHLORIDE 0.9 % IV BOLUS
1000.0000 mL | Freq: Once | INTRAVENOUS | Status: AC
  Administered 2023-12-18: 1000 mL via INTRAVENOUS

## 2023-12-18 NOTE — ED Notes (Signed)
 Brief changed. Pt denies any needs at this time. CB remains within reach.

## 2023-12-18 NOTE — ED Notes (Signed)
 Brief changed. Warm blankets provided. Pt denies any needs at this time. CB remains within reach.

## 2023-12-18 NOTE — ED Provider Notes (Signed)
 [facility] Provider Note   Event Date/Time   First MD Initiated Contact with Patient 12/18/23 1900     (approximate) History  Weakness  HPI Joanna Reed is a 83 y.o. female with a past medical history of dementia, anxiety/depression who presents via EMS from [facility] for abnormal labs.  Patient was found to have sodium of 157.  Patient is somnolent and only responsive to name.  Patient not answering questions at this time.  Unknown baseline ROS: Unable to assess   Physical Exam  Triage Vital Signs: ED Triage Vitals  Encounter Vitals Group     BP 12/18/23 1645 (!) 141/69     Systolic BP Percentile --      Diastolic BP Percentile --      Pulse Rate 12/18/23 1645 64     Resp 12/18/23 1645 18     Temp 12/18/23 1645 97.8 F (36.6 C)     Temp Source 12/18/23 1645 Oral     SpO2 12/18/23 1645 98 %     Weight --      Height --      Head Circumference --      Peak Flow --      Pain Score 12/18/23 1646 0     Pain Loc --      Pain Education --      Exclude from Growth Chart --    Most recent vital signs: Vitals:   12/18/23 2200 12/18/23 2300  BP: (!) 144/65 124/63  Pulse: (!) 59 61  Resp:  15  Temp:    SpO2: 94% 97%   General: Somnolent but awakens to voice CV:  Good peripheral perfusion.  Resp:  Normal effort.  Abd:  No distention.  Other:  Elderly well-developed, well-nourished Caucasian female resting comfortably in no acute distress ED Results / Procedures / Treatments  Labs (all labs ordered are listed, but only abnormal results are displayed) Labs Reviewed  COMPREHENSIVE METABOLIC PANEL - Abnormal; Notable for the following components:      Result Value   Sodium 154 (*)    Potassium 3.2 (*)    Chloride 116 (*)    Glucose, Bld 100 (*)    BUN 33 (*)    Albumin 3.3 (*)    All other components within normal limits  CBC WITH DIFFERENTIAL/PLATELET - Abnormal; Notable for the following components:   WBC 15.1 (*)    Hemoglobin 11.5  (*)    Platelets 449 (*)    Neutro Abs 11.9 (*)    Abs Immature Granulocytes 0.22 (*)    All other components within normal limits  URINALYSIS, ROUTINE W REFLEX MICROSCOPIC - Abnormal; Notable for the following components:   Color, Urine YELLOW (*)    APPearance HAZY (*)    All other components within normal limits  RESP PANEL BY RT-PCR (RSV, FLU A&B, COVID)  RVPGX2  CULTURE, BLOOD (ROUTINE X 2)  CULTURE, BLOOD (ROUTINE X 2)  LACTIC ACID, PLASMA  LACTIC ACID, PLASMA   EKG ED ECG REPORT I, Merwyn Katos, the attending physician, personally viewed and interpreted this ECG. Date: 12/18/2023 EKG Time: 1653 Rate: 72 Rhythm: normal sinus rhythm QRS Axis: normal Intervals: normal ST/T Wave abnormalities: normal Narrative Interpretation: no evidence of acute ischemia RADIOLOGY ED MD interpretation: CT of the head without contrast interpreted by me shows no evidence of acute abnormalities including no intracerebral hemorrhage, obvious masses, or significant edema  Single view portable chest x-ray independently interpreted and shows  minimal patchy opacities in the right midlung -Agree with radiology assessment Official radiology report(s): CT Head Wo Contrast Result Date: 12/18/2023 CLINICAL DATA:  Mental status change. EXAM: CT HEAD WITHOUT CONTRAST TECHNIQUE: Contiguous axial images were obtained from the base of the skull through the vertex without intravenous contrast. RADIATION DOSE REDUCTION: This exam was performed according to the departmental dose-optimization program which includes automated exposure control, adjustment of the mA and/or kV according to patient size and/or use of iterative reconstruction technique. COMPARISON:  None Available. FINDINGS: Brain: No acute intracranial hemorrhage. No focal mass lesion. No CT evidence of acute infarction. No midline shift or mass effect. No hydrocephalus. Basilar cisterns are patent. There are periventricular and subcortical white matter  hypodensities. Generalized cortical atrophy. Vascular: No hyperdense vessel or unexpected calcification. Skull: Normal. Negative for fracture or focal lesion. Sinuses/Orbits: Opacification of the sphenoid sinus similar prior. Frontal sinuses clear. Orbits normal. Orbits are clear. Other: None. IMPRESSION: 1. No acute intracranial findings. 2. Atrophy and white matter microvascular disease. 3. Chronic sphenoid sinus disease. Electronically Signed   By: Genevive Bi M.D.   On: 12/18/2023 20:29   DG Chest Port 1 View Result Date: 12/18/2023 CLINICAL DATA:  Altered mental status with leukocytosis. EXAM: PORTABLE CHEST 1 VIEW COMPARISON:  Chest x-ray 11/22/2023.  CT of the chest 10/01/2023. FINDINGS: There are multiple healed right-sided rib fractures. There are minimal patchy opacities in the right mid lung. No pleural effusion or pneumothorax. Cardiomediastinal silhouette is within normal limits. No acute fractures are identified. IMPRESSION: Minimal patchy opacities in the right mid lung, atelectasis versus infiltrate. Electronically Signed   By: Darliss Cheney M.D.   On: 12/18/2023 20:11   PROCEDURES: Critical Care performed: Yes, see critical care procedure note(s) .1-3 Lead EKG Interpretation  Performed by: Merwyn Katos, MD Authorized by: Merwyn Katos, MD     Interpretation: normal     ECG rate:  71   ECG rate assessment: normal     Rhythm: sinus rhythm     Ectopy: none     Conduction: normal   CRITICAL CARE Performed by: Merwyn Katos  Total critical care time: 33 minutes  Critical care time was exclusive of separately billable procedures and treating other patients.  Critical care was necessary to treat or prevent imminent or life-threatening deterioration.  Critical care was time spent personally by me on the following activities: development of treatment plan with patient and/or surrogate as well as nursing, discussions with consultants, evaluation of patient's response to  treatment, examination of patient, obtaining history from patient or surrogate, ordering and performing treatments and interventions, ordering and review of laboratory studies, ordering and review of radiographic studies, pulse oximetry and re-evaluation of patient's condition.  MEDICATIONS ORDERED IN ED: Medications  azithromycin (ZITHROMAX) 500 mg in sodium chloride 0.9 % 250 mL IVPB (500 mg Intravenous New Bag/Given 12/18/23 2249)  sodium chloride 0.9 % bolus 1,000 mL (0 mLs Intravenous Stopped 12/18/23 2249)  cefTRIAXone (ROCEPHIN) 1 g in sodium chloride 0.9 % 100 mL IVPB (0 g Intravenous Stopped 12/18/23 2129)   IMPRESSION / MDM / ASSESSMENT AND PLAN / ED COURSE  I reviewed the triage vital signs and the nursing notes.                             The patient is on the cardiac monitor to evaluate for evidence of arrhythmia and/or significant heart rate changes. Patient's presentation is most consistent  with acute presentation with potential threat to life or bodily function. The Pt presents with altered mental status and hypernatremia highly concerning for sepsis (suspected urinary or pulmonary source). At this time, the Pt is satting well on room air, normotensive, and appears HDS.  Will start empiric antibiotics and fluids.  Due to normotension, will administer fluids gradually with frequent reassessment. Have low suspicion for a GI, skin/soft tissue, or CNS source at this time, but will reconsider if initial workup is unremarkable.  - CBC, BMP, LFTs - VBG - UA - BCx x2, Lactate - EKG - CXR showing right middle lobe pneumonia - Empiric Abx: Rocephin and azithromycin - Fluids: LR at 150  Dispo: Admit to medicine   FINAL CLINICAL IMPRESSION(S) / ED DIAGNOSES   Final diagnoses:  Community acquired pneumonia of right middle lobe of lung  Sepsis without acute organ dysfunction, due to unspecified organism [facility])   Rx / DC Orders   ED Discharge Orders     None      Note:  This  document was prepared using Dragon voice recognition software and may include unintentional dictation errors.   Merwyn Katos, MD 12/18/23 [phone]

## 2023-12-18 NOTE — ED Triage Notes (Signed)
 See first nurse note.

## 2023-12-18 NOTE — ED Notes (Signed)
 Pt resting with eyes closed. Respirations even and non labored. CB within reach.

## 2023-12-18 NOTE — ED Triage Notes (Signed)
 Patient to ED via [facility] from [facility] for abnormal labs- unsure is Sodium is high or low per staff. States hx of dementia but worse than normal.   EMS VS: 77 HR 98% RA 110 cbg 151/82   Per labs found in chart- Na 157

## 2023-12-19 DIAGNOSIS — G9341 Metabolic encephalopathy: Secondary | ICD-10-CM | POA: Diagnosis not present

## 2023-12-19 DIAGNOSIS — E870 Hyperosmolality and hypernatremia: Secondary | ICD-10-CM | POA: Diagnosis not present

## 2023-12-19 DIAGNOSIS — E876 Hypokalemia: Secondary | ICD-10-CM | POA: Diagnosis not present

## 2023-12-19 DIAGNOSIS — J181 Lobar pneumonia, unspecified organism: Secondary | ICD-10-CM | POA: Diagnosis not present

## 2023-12-19 DIAGNOSIS — F03918 Unspecified dementia, unspecified severity, with other behavioral disturbance: Secondary | ICD-10-CM | POA: Insufficient documentation

## 2023-12-19 DIAGNOSIS — E785 Hyperlipidemia, unspecified: Secondary | ICD-10-CM | POA: Insufficient documentation

## 2023-12-19 LAB — BASIC METABOLIC PANEL
Anion gap: 9 (ref 5–15)
BUN: 28 mg/dL — ABNORMAL HIGH (ref 8–23)
CO2: 24 mmol/L (ref 22–32)
Calcium: 8.3 mg/dL — ABNORMAL LOW (ref 8.9–10.3)
Chloride: 121 mmol/L — ABNORMAL HIGH (ref 98–111)
Creatinine, Ser: 0.65 mg/dL (ref 0.44–1.00)
GFR, Estimated: >60 mL/min (ref >60)
Glucose, Bld: 103 mg/dL — ABNORMAL HIGH (ref 70–99)
Potassium: 3.1 mmol/L — ABNORMAL LOW (ref 3.5–5.1)
Sodium: 154 mmol/L — ABNORMAL HIGH (ref 135–145)

## 2023-12-19 LAB — CBC
HCT: 32.8 % — ABNORMAL LOW (ref 36.0–46.0)
Hemoglobin: 10.2 g/dL — ABNORMAL LOW (ref 12.0–15.0)
MCH: 28 pg (ref 26.0–34.0)
MCHC: 31.1 g/dL (ref 30.0–36.0)
MCV: 90.1 fL (ref 80.0–100.0)
Platelets: 403 10*3/uL — ABNORMAL HIGH (ref 150–400)
RBC: 3.64 MIL/uL — ABNORMAL LOW (ref 3.87–5.11)
RDW: 13.1 % (ref 11.5–15.5)
WBC: 11.3 10*3/uL — ABNORMAL HIGH (ref 4.0–10.5)
nRBC: 0 % (ref 0.0–0.2)

## 2023-12-19 MED ORDER — DOCUSATE SODIUM 100 MG PO CAPS
100.0000 mg | ORAL_CAPSULE | Freq: Every day | ORAL | Status: DC
  Administered 2023-12-19 – 2023-12-21 (×2): 100 mg via ORAL
  Filled 2023-12-19 (×3): qty 1

## 2023-12-19 MED ORDER — ZINC OXIDE 20 % EX OINT: 1.0000 | TOPICAL_OINTMENT | CUTANEOUS | Status: DC | PRN

## 2023-12-19 MED ORDER — POLYETHYLENE GLYCOL 3350 17 G PO PACK
17.0000 g | PACK | Freq: Every day | ORAL | Status: DC
Start: 2023-12-19 — End: 2023-12-21
  Administered 2023-12-19 – 2023-12-21 (×3): 17 g via ORAL
  Filled 2023-12-19 (×3): qty 1

## 2023-12-19 MED ORDER — ENOXAPARIN SODIUM 30 MG/0.3ML IJ SOSY
30.0000 mg | PREFILLED_SYRINGE | INTRAMUSCULAR | Status: DC
  Administered 2023-12-20 – 2023-12-21 (×2): 30 mg via SUBCUTANEOUS
  Filled 2023-12-19 (×2): qty 0.3

## 2023-12-19 MED ORDER — POTASSIUM CHLORIDE 10 MEQ/100ML IV SOLN
10.0000 meq | INTRAVENOUS | Status: AC
  Administered 2023-12-19 (×4): 10 meq via INTRAVENOUS
  Filled 2023-12-19: qty 100

## 2023-12-19 MED ORDER — ACETAMINOPHEN 325 MG PO TABS: 650.0000 mg | ORAL_TABLET | Freq: Four times a day (QID) | ORAL | Status: DC | PRN

## 2023-12-19 MED ORDER — SODIUM CHLORIDE 0.45 % IV SOLN: INTRAVENOUS | Status: DC

## 2023-12-19 MED ORDER — TRAZODONE HCL 50 MG PO TABS: 25.0000 mg | ORAL_TABLET | Freq: Every evening | ORAL | Status: DC | PRN

## 2023-12-19 MED ORDER — ACETAMINOPHEN 650 MG RE SUPP: 650.0000 mg | Freq: Four times a day (QID) | RECTAL | Status: DC | PRN

## 2023-12-19 MED ORDER — SERTRALINE HCL 50 MG PO TABS
200.0000 mg | ORAL_TABLET | Freq: Every day | ORAL | Status: DC
  Administered 2023-12-19 – 2023-12-21 (×3): 200 mg via ORAL
  Filled 2023-12-19 (×3): qty 4

## 2023-12-19 MED ORDER — HYDROCOD POLI-CHLORPHE POLI ER 10-8 MG/5ML PO SUER: 5.0000 mL | Freq: Two times a day (BID) | ORAL | Status: DC | PRN

## 2023-12-19 MED ORDER — IPRATROPIUM-ALBUTEROL 0.5-2.5 (3) MG/3ML IN SOLN: 3.0000 mL | RESPIRATORY_TRACT | Status: DC | PRN

## 2023-12-19 MED ORDER — ADULT MULTIVITAMIN W/MINERALS CH
1.0000 | ORAL_TABLET | Freq: Every day | ORAL | Status: DC
  Administered 2023-12-19 – 2023-12-21 (×3): 1 via ORAL
  Filled 2023-12-19 (×3): qty 1

## 2023-12-19 MED ORDER — DEXTROSE 5 % IV SOLN: INTRAVENOUS | Status: AC

## 2023-12-19 MED ORDER — RISPERIDONE 0.25 MG PO TABS
0.2500 mg | ORAL_TABLET | Freq: Two times a day (BID) | ORAL | Status: DC
  Administered 2023-12-19 – 2023-12-21 (×5): 0.25 mg via ORAL
  Filled 2023-12-19 (×5): qty 1

## 2023-12-19 MED ORDER — MAGNESIUM HYDROXIDE 400 MG/5ML PO SUSP: 30.0000 mL | Freq: Every day | ORAL | Status: DC | PRN

## 2023-12-19 MED ORDER — IPRATROPIUM-ALBUTEROL 0.5-2.5 (3) MG/3ML IN SOLN
3.0000 mL | Freq: Four times a day (QID) | RESPIRATORY_TRACT | Status: DC
  Administered 2023-12-19 (×2): 3 mL via RESPIRATORY_TRACT
  Filled 2023-12-19 (×2): qty 3

## 2023-12-19 MED ORDER — SODIUM CHLORIDE 0.9 % IV SOLN
500.0000 mg | INTRAVENOUS | Status: DC
  Administered 2023-12-19 – 2023-12-20 (×2): 500 mg via INTRAVENOUS
  Filled 2023-12-19 (×2): qty 5

## 2023-12-19 MED ORDER — FOLIC ACID 1 MG PO TABS
1.0000 mg | ORAL_TABLET | Freq: Every day | ORAL | Status: DC
  Administered 2023-12-19 – 2023-12-21 (×3): 1 mg via ORAL
  Filled 2023-12-19 (×3): qty 1

## 2023-12-19 MED ORDER — PRAVASTATIN SODIUM 20 MG PO TABS
10.0000 mg | ORAL_TABLET | Freq: Every day | ORAL | Status: DC
  Administered 2023-12-19 – 2023-12-20 (×2): 10 mg via ORAL
  Filled 2023-12-19 (×2): qty 1

## 2023-12-19 MED ORDER — ONDANSETRON HCL 4 MG/2ML IJ SOLN: 4.0000 mg | Freq: Four times a day (QID) | INTRAMUSCULAR | Status: DC | PRN

## 2023-12-19 MED ORDER — GUAIFENESIN ER 600 MG PO TB12
600.0000 mg | ORAL_TABLET | Freq: Two times a day (BID) | ORAL | Status: DC
  Administered 2023-12-19 – 2023-12-21 (×5): 600 mg via ORAL
  Filled 2023-12-19 (×5): qty 1

## 2023-12-19 MED ORDER — SODIUM CHLORIDE 0.9 % IV SOLN
1.0000 g | Freq: Once | INTRAVENOUS | Status: AC
  Administered 2023-12-19: 1 g via INTRAVENOUS
  Filled 2023-12-19: qty 10

## 2023-12-19 MED ORDER — DONEPEZIL HCL 5 MG PO TABS
10.0000 mg | ORAL_TABLET | Freq: Every day | ORAL | Status: DC
  Administered 2023-12-19 – 2023-12-21 (×3): 10 mg via ORAL
  Filled 2023-12-19 (×3): qty 2

## 2023-12-19 MED ORDER — ONDANSETRON HCL 4 MG PO TABS: 4.0000 mg | ORAL_TABLET | Freq: Four times a day (QID) | ORAL | Status: DC | PRN

## 2023-12-19 MED ORDER — ENOXAPARIN SODIUM 40 MG/0.4ML IJ SOSY
40.0000 mg | PREFILLED_SYRINGE | INTRAMUSCULAR | Status: DC
  Administered 2023-12-19: 40 mg via SUBCUTANEOUS
  Filled 2023-12-19: qty 0.4

## 2023-12-19 MED ORDER — SODIUM CHLORIDE 0.9 % IV SOLN
2.0000 g | INTRAVENOUS | Status: DC
  Administered 2023-12-19 – 2023-12-20 (×2): 2 g via INTRAVENOUS
  Filled 2023-12-19 (×3): qty 20

## 2023-12-19 MED ORDER — DICLOFENAC SODIUM 1 % EX GEL
2.0000 g | Freq: Two times a day (BID) | CUTANEOUS | Status: DC
  Administered 2023-12-19 – 2023-12-21 (×5): 2 g via TOPICAL
  Filled 2023-12-19: qty 100

## 2023-12-19 NOTE — ED Notes (Signed)
 Speech therapy at bedside.

## 2023-12-19 NOTE — Progress Notes (Signed)
 PHARMACIST - PHYSICIAN COMMUNICATION  CONCERNING:  Enoxaparin (Lovenox) for DVT Prophylaxis   ASSESSMENT: Patient was prescribed enoxaparin 40 mg subcutaneously every 24 hours for VTE prophylaxis.   Body mass index is 18.17 kg/m.  Estimated Creatinine Clearance: 35.5 mL/min (by C-G formula based on SCr of 0.65 mg/dL).  Based on [facility] policy, patient qualifies for enoxaparin dosing of 30 mg every 24 hours because their total body weight is <45 kg.  PLAN: Pharmacy has adjusted enoxaparin dose per [facility] policy.  Description: Patient is now receiving enoxaparin 30 mg subcutaneously every 24 hours.  Will M. Dareen Piano, PharmD Clinical Pharmacist 12/19/2023 12:11 PM

## 2023-12-19 NOTE — Assessment & Plan Note (Deleted)
-   The patient will be admitted to a medical telemetry bed. - Will continue antibiotic therapy with IV Rocephin and Zithromax. - Mucolytic therapy be provided as well as duo nebs q.i.d. and q.4 hours p.r.n. - We will follow blood cultures.

## 2023-12-19 NOTE — Assessment & Plan Note (Signed)
 Sepsis ruled out, did not meet SIRS criteria.  Patient received 3 days of Rocephin and high dose Zithromax.  Diet upgraded to dysphagia 3 diet.  Will switch to augmentin twice a day for three days starting this evening.

## 2023-12-19 NOTE — ED Notes (Signed)
 Pt moved to bed 37, Report given to Yaakov Guthrie, Charity fundraiser. Care relinquished at this time

## 2023-12-19 NOTE — TOC Initial Note (Signed)
 Transition of Care [facility]) - Initial/Assessment Note    Patient Details  Name: Joanna Reed MRN: [id number] Date of Birth: [birth year]  [facility]) CM/SW Contact:    Margarito Liner, LCSW Phone Number: 12/19/2023, 2:08 PM  Clinical Narrative:  Patient only oriented to self. CSW called friend/HCPOA, Eber Jones McLamb, introduced role, and explained that discharge planning would be discussed. Ms. Albertine Grates confirmed patient is a resident at [facility]. CSW called facility and spoke to [place]. Patient was not getting home health services prior to admission but they completed paperwork to start services with [facility] on the day of admission. CSW contacted [employer]. MD is aware and will discuss with [facility] tomorrow. Patient uses a wheelchair at the facility. No further concerns. CSW will continue to follow patient and Ms. McLamb for support and facilitate return to [facility] once medically stable.                Expected Discharge Plan: Assisted Living (with hospice) Barriers to Discharge: Continued Medical Work up   Patient Goals and CMS Choice            Expected Discharge Plan and Services     Post Acute Care Choice:  (TBD) Living arrangements for the past 2 months: Assisted Living Facility (Memory Care)                                      Prior Living Arrangements/Services Living arrangements for the past 2 months: Assisted Living Facility (Memory Care) Lives with:: Facility Resident Patient language and need for interpreter reviewed:: Yes Do you feel safe going back to the place where you live?: Yes      Need for Family Participation in Patient Care: Yes (Comment) Care giver support system in place?: Yes (comment) Current home services: DME Criminal Activity/Legal Involvement Pertinent to Current Situation/Hospitalization: No - Comment as needed  Activities of Daily Living      Permission Sought/Granted Permission sought to  share information with : Facility Medical sales representative, Family Supports    Share Information with NAME: Therisa Doyne  Permission granted to share info w AGENCY: [facility]  Permission granted to share info w Relationship: Friend/HCPOA  Permission granted to share info w Contact Information: [phone]  Emotional Assessment   Attitude/Demeanor/Rapport: Unable to Assess Affect (typically observed): Unable to Assess Orientation: : Oriented to Self Alcohol / Substance Use: Not Applicable Psych Involvement: No (comment)  Admission diagnosis:  Sepsis due to pneumonia (HCC) [J18.9, A41.9] Community acquired pneumonia of right middle lobe of lung [J18.9] Sepsis without acute organ dysfunction, due to unspecified organism [facility]) [A41.9] Patient Active Problem List   Diagnosis Date Noted   Dementia with behavioral disturbance (HCC) 12/19/2023   Dyslipidemia 12/19/2023   Hypokalemia 12/19/2023   Hypernatremia 12/19/2023   Lobar pneumonia (HCC) 12/19/2023   Influenza A 11/23/2023   Primary hypertension 11/23/2023   Multiple closed fractures of ribs of right side 10/01/2023   Unwitnessed fall 10/01/2023   AMS (altered mental status) 09/15/2022   Anemia 09/15/2022   Generalized anxiety disorder 12/31/2020   Dysthymia 12/31/2020   Dementia without behavioral disturbance (HCC) 12/31/2020   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 [place] [address] Phone: [phone] Fax: [phone]     Social  Drivers of Health (SDOH) Social History: SDOH Screenings   Food Insecurity: Patient Unable To Answer (10/02/2023)  Housing: High Risk (10/02/2023)  Transportation Needs: Patient Unable To Answer (10/02/2023)  Utilities: Patient Unable To Answer (10/02/2023)  Tobacco Use: Low Risk  (12/18/2023)   SDOH Interventions:     Readmission Risk Interventions    12/19/2023    2:08 PM  Readmission Risk Prevention Plan  PCP or Specialist Appt within 3-5 Days Complete  Social Work Consult for Recovery Care Planning/Counseling Complete  Medication Review Oceanographer) Complete

## 2023-12-19 NOTE — Assessment & Plan Note (Addendum)
 Replaced

## 2023-12-19 NOTE — Assessment & Plan Note (Addendum)
 -  Continue Pravachol ?

## 2023-12-19 NOTE — Assessment & Plan Note (Addendum)
 Continue Aricept, Zoloft and Risperdal.

## 2023-12-19 NOTE — H&P (Addendum)
 [place]   PATIENT NAME: Joanna Reed    MR#:  [id number]  DATE OF BIRTH:  [birth year]  DATE OF ADMISSION:  12/18/2023  PRIMARY CARE PHYSICIAN: Doreene Nest, NP   [facility]  REQUESTING/REFERRING PHYSICIAN: Donna Bernard, MD  CHIEF COMPLAINT:   Chief Complaint  Patient presents with   Weakness    HISTORY OF PRESENT ILLNESS:  Joanna Reed is a 83 y.o. female with medical history significant for anxiety, depression and dementia, who presented to the emergency room with acute onset of altered mental status with excessive somnolence at [facility] where he resides.,  Who presented to the emergency room with acute onset of altered mental status with excessive somnolence as well as generalized weakness.  The patient had labs and his sodium was 157.  She was unresponsive to her name.  During interview she was fairly somnolent and not historian.  No fever or chills.  No other history could be obtained.  ED Course: When the patient came to the ER, temperature was 99.1 with heart rate of 69 and later 58 with BP 94/82 and later 131/51.  Respiratory rate was later 21.  Labs revealed hyponatremia of 154 and 116 with potassium of 3.2 and BUN was 33 with albumin of 3.3 with otherwise unremarkable CMP.  Lactic acid was 1.2 and later 1.  CBC showed leukocytosis 15.1 with neutrophilia and thrombocytosis of 449. EKG as reviewed by me : EKG showed normal sinus rhythm with rate of 72 with poor R wave progression. Imaging: Portable chest x-ray showed minimal patchy opacities in the right midlung, atelectasis versus infiltrate.  The patient was given 1 L IV normal saline, IV Rocephin and Zithromax.  She will be admitted to a medical telemetry bed for further evaluation and management. PAST MEDICAL HISTORY:   Past Medical History:  Diagnosis Date   Anxiety and depression    Dementia (HCC)     PAST SURGICAL HISTORY:   Past  Surgical History:  Procedure Laterality Date   ABDOMINAL HYSTERECTOMY     1980    SOCIAL HISTORY:   Social History   Tobacco Use   Smoking status: Never   Smokeless tobacco: Never  Substance Use Topics   Alcohol use: Not Currently    Comment: occ    FAMILY HISTORY:   Family History  Problem Relation Age of Onset   Arthritis Mother    Heart disease Father        Deceased from MI   Stroke Maternal Grandmother 30   Arthritis Maternal Grandfather     DRUG ALLERGIES:   Allergies  Allergen Reactions   Sulfa Antibiotics     REVIEW OF SYSTEMS:   ROS As per history of present illness. All pertinent systems were reviewed above. Constitutional, HEENT, cardiovascular, respiratory, GI, GU, musculoskeletal, neuro, psychiatric, endocrine, integumentary and hematologic systems were reviewed and are otherwise negative/unremarkable except for positive findings mentioned above in the HPI.   MEDICATIONS AT HOME:   Prior to Admission medications   Medication Sig Start Date End Date Taking? Authorizing Provider  acetaminophen (TYLENOL) 325 MG tablet Take 2 tablets (650 mg total) by mouth every 6 (six) hours as needed for mild pain (pain score 1-3) (or Fever >/= 101). 11/23/23  Yes Jonah Blue, MD  diclofenac Sodium (VOLTAREN) 1 % GEL Apply 2 g topically 2 (two) times daily. (Apply to left knee)   Yes [provider]  DOK 100  MG capsule Take 100 mg by mouth daily. 12/14/23  Yes [provider]  donepezil (ARICEPT) 10 MG tablet Take 10 mg by mouth daily.   Yes [provider]  folic acid (FOLVITE) 1 MG tablet Take 1 mg by mouth daily.   Yes [provider]  Multiple Vitamin (MULTIVITAMIN WITH MINERALS) TABS tablet Take 1 tablet by mouth daily.   Yes [provider]  Polyethylene Glycol 3350 (PEG 3350) 17 GM/SCOOP POWD Take 17 g by mouth daily. 11/26/23  Yes [provider]  pravastatin (PRAVACHOL) 10 MG tablet Take 10 mg by mouth at  bedtime.   Yes [provider]  risperiDONE (RISPERDAL) 0.25 MG tablet Take 0.25 mg by mouth 2 (two) times daily. 0800/1600   Yes [provider]  sertraline (ZOLOFT) 100 MG tablet Take 200 mg by mouth daily.   Yes [provider]  zinc oxide 20 % ointment Apply 1 Application topically as needed for irritation. (Apply to buttocks)   Yes [provider]  loperamide (IMODIUM) 2 MG capsule Take 4 mg by mouth as needed for diarrhea or loose stools.    [provider]      VITAL SIGNS:  Blood pressure (!) 146/64, pulse 61, temperature 98.2 F (36.8 C), temperature source Axillary, resp. rate 19, SpO2 93%.  PHYSICAL EXAMINATION:  Physical Exam  GENERAL:  83 y.o.-year-old Caucasian female patient lying in the bed with no acute distress.  The patient was fairly somnolent but arousable. EYES: Pupils equal, round, reactive to light and accommodation. No scleral icterus. Extraocular muscles intact.  HEENT: Head atraumatic, normocephalic. Oropharynx and nasopharynx clear.  NECK:  Supple, no jugular venous distention. No thyroid enlargement, no tenderness.  LUNGS: Slight diminished right midlung zone with associated crackles.. No use of accessory muscles of respiration.  CARDIOVASCULAR: Regular rate and rhythm, S1, S2 normal. No murmurs, rubs, or gallops.  ABDOMEN: Soft, nondistended, nontender. Bowel sounds present. No organomegaly or mass.  EXTREMITIES: No pedal edema, cyanosis, or clubbing.  NEUROLOGIC: Cranial nerves II through XII are intact. Muscle strength 5/5 in all extremities. Sensation intact. Gait not checked.  PSYCHIATRIC: The patient is alert and oriented x 3.  Normal affect and good eye contact. SKIN: No obvious rash, lesion, or ulcer.   LABORATORY PANEL:   CBC Recent Labs  Lab 12/18/23 1649  WBC 15.1*  HGB 11.5*  HCT 36.9  PLT 449*    ------------------------------------------------------------------------------------------------------------------  Chemistries  Recent Labs  Lab 12/18/23 1649  NA 154*  K 3.2*  CL 116*  CO2 27  GLUCOSE 100*  BUN 33*  CREATININE 0.79  CALCIUM 8.9  AST 16  ALT 16  ALKPHOS 65  BILITOT 0.4   ------------------------------------------------------------------------------------------------------------------  Cardiac Enzymes No results for input(s): "TROPONINI" in the last 168 hours. ------------------------------------------------------------------------------------------------------------------  RADIOLOGY:  CT Head Wo Contrast Result Date: 12/18/2023 CLINICAL DATA:  Mental status change. EXAM: CT HEAD WITHOUT CONTRAST TECHNIQUE: Contiguous axial images were obtained from the base of the skull through the vertex without intravenous contrast. RADIATION DOSE REDUCTION: This exam was performed according to the departmental dose-optimization program which includes automated exposure control, adjustment of the mA and/or kV according to patient size and/or use of iterative reconstruction technique. COMPARISON:  None Available. FINDINGS: Brain: No acute intracranial hemorrhage. No focal mass lesion. No CT evidence of acute infarction. No midline shift or mass effect. No hydrocephalus. Basilar cisterns are patent. There are periventricular and subcortical white matter hypodensities. Generalized cortical atrophy. Vascular: No hyperdense vessel or  unexpected calcification. Skull: Normal. Negative for fracture or focal lesion. Sinuses/Orbits: Opacification of the sphenoid sinus similar prior. Frontal sinuses clear. Orbits normal. Orbits are clear. Other: None. IMPRESSION: 1. No acute intracranial findings. 2. Atrophy and white matter microvascular disease. 3. Chronic sphenoid sinus disease. Electronically Signed   By: Genevive Bi M.D.   On: 12/18/2023 20:29   DG Chest Port 1 View Result Date:  12/18/2023 CLINICAL DATA:  Altered mental status with leukocytosis. EXAM: PORTABLE CHEST 1 VIEW COMPARISON:  Chest x-ray 11/22/2023.  CT of the chest 10/01/2023. FINDINGS: There are multiple healed right-sided rib fractures. There are minimal patchy opacities in the right mid lung. No pleural effusion or pneumothorax. Cardiomediastinal silhouette is within normal limits. No acute fractures are identified. IMPRESSION: Minimal patchy opacities in the right mid lung, atelectasis versus infiltrate. Electronically Signed   By: Darliss Cheney M.D.   On: 12/18/2023 20:11      IMPRESSION AND PLAN:  Assessment and Plan: * Sepsis due to pneumonia [facility]) - The patient will be admitted to a medical telemetry bed. - Will continue antibiotic therapy with IV Rocephin and Zithromax. - Mucolytic therapy be provided as well as duo nebs q.i.d. and q.4 hours p.r.n. - We will follow blood cultures.   Acute metabolic encephalopathy - This is likely secondary to #1. - Will monitor mental status with above management. - We will follow neurochecks every 4 hours for 24 hours.  Hypernatremia - The patient be hydrated with IV lactated ringer and we will follow BMP.  Hypokalemia - We will replace potassium and check magnesium level.  Dyslipidemia - We will continue statin therapy.  Dementia with behavioral disturbance (HCC) - We will continue Aricept and Risperdal.   DVT prophylaxis: Lovenoxaa.  Advanced Care Planning:  Code Status: Patient is DNR and DNI. Family Communication:  The plan of care was discussed in details with the patient (and family). I answered all questions. The patient agreed to proceed with the above mentioned plan. Further management will depend upon hospital course. Disposition Plan: Back to previous home environment Consults called: none.  All the records are reviewed and case discussed with ED provider.  Status is: Inpatient  At the time of the admission, it appears that the  appropriate admission status for this patient is inpatient.  This is judged to be reasonable and necessary in order to provide the required intensity of service to ensure the patient's safety given the presenting symptoms, physical exam findings and initial radiographic and laboratory data in the context of comorbid conditions.  The patient requires inpatient status due to high intensity of service, high risk of further deterioration and high frequency of surveillance required.  I certify that at the time of admission, it is my clinical judgment that the patient will require inpatient hospital care extending more than 2 midnights.                            Dispo: The patient is from: Home              Anticipated d/c is to: Home              Patient currently is not medically stable to d/c.              Difficult to place patient: No  Hannah Beat M.D on 12/19/2023 at 4:04 AM  [facility]   From 7 PM-7 AM, contact night-coverage [email or web address]  CC:  Primary care physician; Doreene Nest, NP

## 2023-12-19 NOTE — Evaluation (Signed)
 Clinical/Bedside Swallow Evaluation Patient Details  Name: Joanna Reed MRN: [id number] Date of Birth: 1941-05-06  Today's Date: 12/19/2023 Time: SLP Start Time (ACUTE ONLY): 1315 SLP Stop Time (ACUTE ONLY): 1330 SLP Time Calculation (min) (ACUTE ONLY): 15 min  Past Medical History:  Past Medical History:  Diagnosis Date   Anxiety and depression    Dementia (HCC)    Past Surgical History:  Past Surgical History:  Procedure Laterality Date   ABDOMINAL HYSTERECTOMY     1980   HPI:  Pt is a 83 y.o. female who presented to ED with AMS and somnolence. Pt with medical history significant for anxiety, depression and dementia. Head CT, "1. No acute intracranial findings.  2. Atrophy and white matter microvascular disease.  3. Chronic sphenoid sinus disease." CXR "Minimal patchy opacities in the right mid lung, atelectasis versus  infiltrate."    Assessment / Plan / Recommendation  Clinical Impression  Pt seen for clinical swallowing evaluation. Evaluation, including oral motor examination and PO trials, limited by pt's AMS. Pt notably confused. Low volume, mumbling appreciated making pt's speech difficult to understand. Pt made no attempt to feed self. Weak, congested cough appreciated prior to POs. Pt presents with at least a moderate oral dysphagia c/b oral holding/delayed oral transient with puree and inconsistent ability to siphon liquids via straw. Pt declined solid trials despite education/encouragement. Concern for pharyngeal dysphagia given delayed throat clear x1 following LARGE cup sip of thin liquids. Suspect pt's dysphagia is impacted by overall cognitive status. Recommend a puree diet with thin liquids with safe swallowing strategies/aspiration precautions as outlined below. Pt would benefit from assistance with feeding at present. SLP to f/u per POC for diet tolerance and trials of upgraded textures pending improvement in mental status. SLP Visit Diagnosis: Dysphagia, oropharyngeal  phase (R13.12)    Aspiration Risk  Mild aspiration risk;Moderate aspiration risk;Risk for inadequate nutrition/hydration    Diet Recommendation Dysphagia 1 (Puree);Thin liquid    Liquid Administration via: Spoon;Cup;Straw Medication Administration: Whole meds with puree (vs crushed) Supervision: Staff to assist with self feeding;Full supervision/cueing for compensatory strategies Compensations: Minimize environmental distractions;Slow rate;Small sips/bites Postural Changes: Seated upright at 90 degrees;Remain upright for at least 30 minutes after po intake    Other  Recommendations Oral Care Recommendations: Oral care QID;Oral care before and after PO    Recommendations for follow up therapy are one component of a multi-disciplinary discharge planning process, led by the attending physician.  Recommendations may be updated based on patient status, additional functional criteria and insurance authorization.  Follow up Recommendations Follow physician's recommendations for discharge plan and follow up therapies      Assistance Recommended at Discharge    Functional Status Assessment Patient has had a recent decline in their functional status and demonstrates the ability to make significant improvements in function in a reasonable and predictable amount of time.  Frequency and Duration min 2x/week  2 weeks       Prognosis Prognosis for improved oropharyngeal function: Fair Barriers to Reach Goals: Cognitive deficits;Severity of deficits      Swallow Study   General Date of Onset: 12/18/23 HPI: Pt is a 83 y.o. female who presented to ED with AMS and somnolence. Pt with medical history significant for anxiety, depression and dementia. Head CT, "1. No acute intracranial findings.  2. Atrophy and white matter microvascular disease.  3. Chronic sphenoid sinus disease." CXR "Minimal patchy opacities in the right mid lung, atelectasis versus  infiltrate." Type of Study: Bedside Swallow  Evaluation  Previous Swallow Assessment: none per EMR Diet Prior to this Study: Regular;Thin liquids (Level 0) Temperature Spikes Noted: No Respiratory Status: Room air History of Recent Intubation: No Behavior/Cognition: Alert;Distractible;Requires cueing;Doesn't follow directions;Confused Oral Cavity Assessment: Within Functional Limits Oral Care Completed by SLP: Yes Oral Cavity - Dentition:  (difficult to assess) Self-Feeding Abilities: Total assist Patient Positioning: Upright in bed Baseline Vocal Quality: Low vocal intensity Volitional Cough: Cognitively unable to elicit Volitional Swallow: Unable to elicit    Oral/Motor/Sensory Function Overall Oral Motor/Sensory Function: Generalized oral weakness (suspected)   Ice Chips Ice chips: Within functional limits Presentation: Spoon   Thin Liquid Thin Liquid: Impaired Presentation: Cup;Straw Oral Phase Impairments: Reduced labial seal Oral Phase Functional Implications:  (initial difficulty siphoning liquids from straw) Pharyngeal  Phase Impairments: Throat Clearing - Delayed Other Comments: delayed throat clear x1 with LARGE cup sip    Nectar Thick Nectar Thick Liquid: Not tested   Honey Thick Honey Thick Liquid: Not tested   Puree Puree: Impaired Presentation: Self Fed Oral Phase Impairments: Poor awareness of bolus Oral Phase Functional Implications: Oral holding;Prolonged oral transit Pharyngeal Phase Impairments:  (WFL)   Solid     Solid: Not tested Other Comments: pt refused     Clyde Canterbury, M.S., CCC-SLP Speech-Language Pathologist [place] [phone] (ASCOM)  Joanna Reed 12/19/2023,1:42 PM

## 2023-12-19 NOTE — Assessment & Plan Note (Addendum)
 The patient has underlying dementia.  Worsening mental status likely secondary to pneumonia and hypernatremia.

## 2023-12-19 NOTE — Progress Notes (Signed)
  Progress Note   Patient: Joanna Reed XBM:[id number] DOB: 09/19/1941 DOA: 12/18/2023     1 DOS: the patient was seen and examined on 12/19/2023   Brief hospital course: 83 year old female past medical history of anxiety dementia and depression presented to the emergency room with acute altered mental status and excessive sleeping at [facility] a whole memory care.  Initial sodium 154 and potassium 3.2.  Patient was started on half-normal saline.  Started on antibiotics for pneumonia.  2/26.  Will change IV fluids to D5 NS and also replace potassium.  Patient on antibiotics for pneumonia  Assessment and Plan: * Lobar pneumonia (HCC) Sepsis ruled out, did not meet SIRS criteria.  Continue Rocephin and Zithromax.  IV fluid hydration.  Swallow evaluation.  Acute metabolic encephalopathy The patient has underlying dementia.  Worsening mental status likely secondary to pneumonia and hypernatremia.  Hypernatremia Patient initially hydrated but will switch fluids over to D5W.  Mention the possibility of palliative care consultation to POA over the phone but I wanted to hold off and see how things go first.  Hypokalemia Replace potassium IV.  Dyslipidemia Continue Pravachol  Dementia with behavioral disturbance (HCC) Continue Aricept, Zoloft and Risperdal.        Subjective: Patient is alert but kept her eyes closed during most of the talking with her and during the exam.  Some of the patient's responses are not appropriate to the questions I asked.  Patient does have a history of dementia.  Admitted with hyponatremia and pneumonia.  Physical Exam: Vitals:   12/19/23 1000 12/19/23 1100 12/19/23 1200 12/19/23 1230  BP: (!) 153/63 (!) 112/54 (!) 133/59   Pulse: 88 73 72 70  Resp: (!) 23 (!) 22 (!) 25 (!) 22  Temp:   98.2 F (36.8 C)   TempSrc:   Axillary   SpO2: 97% 96% 92% 95%  Weight:       Physical Exam HENT:     Head: Normocephalic.  Eyes:     General: Lids are normal.   Cardiovascular:     Rate and Rhythm: Normal rate and regular rhythm.     Heart sounds: Normal heart sounds, S1 normal and S2 normal.  Pulmonary:     Breath sounds: No decreased breath sounds, wheezing, rhonchi or rales.  Abdominal:     Palpations: Abdomen is soft.     Tenderness: There is no abdominal tenderness.  Musculoskeletal:     Right lower leg: No swelling.     Left lower leg: No swelling.     Comments: Right foot angled down  Skin:    General: Skin is warm.     Findings: No rash.  Neurological:     Mental Status: She is alert.     Data Reviewed: CT head shows atrophy Sodium 154, potassium 3.1, creatinine 0.65, white blood count 11.3, hemoglobin 10.2, platelet count 403 Family Communication: Spoke with friend and POA on the phone  Disposition: Status is: Inpatient Remains inpatient appropriate because: IV antibiotics for pneumonia, change IV fluids to D5W for hypernatremia.  Planned Discharge Destination: Memory care unit at [facility]    Time spent: 28 minutes Case discussed with nursing staff  Author: Alford Highland, MD 12/19/2023 1:00 PM  For on call review [email or web address].

## 2023-12-19 NOTE — Assessment & Plan Note (Addendum)
 Sodium now in the normal range.  Will let IV fluids expire and recheck labs tomorrow.  Hypernatremia is a poor prognostic indicator.

## 2023-12-19 NOTE — Hospital Course (Signed)
 83 year old female past medical history of anxiety dementia and depression presented to the emergency room with acute altered mental status and excessive sleeping at [facility] a whole memory care.  Initial sodium 154 and potassium 3.2.  Patient was started on half-normal saline.  Started on antibiotics for pneumonia.  2/26.  Will change IV fluids to D5 NS and also replace potassium.  Patient on antibiotics for pneumonia 2/27.  Replace potassium.  Sodium now in the normal range.  Will let fluids expire today and recheck labs tomorrow. 2/28. WBC count normal range, sodium normal range.

## 2023-12-20 DIAGNOSIS — J181 Lobar pneumonia, unspecified organism: Secondary | ICD-10-CM | POA: Diagnosis not present

## 2023-12-20 DIAGNOSIS — E870 Hyperosmolality and hypernatremia: Secondary | ICD-10-CM | POA: Diagnosis not present

## 2023-12-20 DIAGNOSIS — G9341 Metabolic encephalopathy: Secondary | ICD-10-CM | POA: Diagnosis not present

## 2023-12-20 DIAGNOSIS — E876 Hypokalemia: Secondary | ICD-10-CM | POA: Diagnosis not present

## 2023-12-20 LAB — PHOSPHORUS: Phosphorus: 2.6 mg/dL (ref 2.5–4.6)

## 2023-12-20 LAB — BASIC METABOLIC PANEL
Anion gap: 8 (ref 5–15)
BUN: 14 mg/dL (ref 8–23)
CO2: 24 mmol/L (ref 22–32)
Calcium: 8.3 mg/dL — ABNORMAL LOW (ref 8.9–10.3)
Chloride: 111 mmol/L (ref 98–111)
Creatinine, Ser: 0.64 mg/dL (ref 0.44–1.00)
GFR, Estimated: >60 mL/min (ref >60)
Glucose, Bld: 110 mg/dL — ABNORMAL HIGH (ref 70–99)
Potassium: 3.4 mmol/L — ABNORMAL LOW (ref 3.5–5.1)
Sodium: 143 mmol/L (ref 135–145)

## 2023-12-20 LAB — MAGNESIUM: Magnesium: 2 mg/dL (ref 1.7–2.4)

## 2023-12-20 MED ORDER — POTASSIUM CHLORIDE 20 MEQ PO PACK
20.0000 meq | PACK | Freq: Two times a day (BID) | ORAL | Status: AC
  Administered 2023-12-20 (×2): 20 meq via ORAL
  Filled 2023-12-20 (×2): qty 1

## 2023-12-20 NOTE — Progress Notes (Signed)
  Progress Note   Patient: Joanna Reed ZOX:[id number] DOB: 10-Jun-1941 DOA: 12/18/2023     2 DOS: the patient was seen and examined on 12/20/2023   Brief hospital course: 83 year old female past medical history of anxiety dementia and depression presented to the emergency room with acute altered mental status and excessive sleeping at [facility] a whole memory care.  Initial sodium 154 and potassium 3.2.  Patient was started on half-normal saline.  Started on antibiotics for pneumonia.  2/26.  Will change IV fluids to D5 NS and also replace potassium.  Patient on antibiotics for pneumonia 2/27.  Replace potassium.  Sodium now in the normal range.  Will let fluids expire today and recheck labs tomorrow.  Assessment and Plan: * Lobar pneumonia (HCC) Sepsis ruled out, did not meet SIRS criteria.  Continue Rocephin and Zithromax.  Diet upgraded to dysphagia 3 diet  Acute metabolic encephalopathy The patient has underlying dementia.  Worsening mental status likely secondary to pneumonia and hypernatremia.  Patient able to answer some questions today and follow some simple commands.  Hypernatremia Sodium now in the normal range.  Will let IV fluids expire and recheck labs tomorrow.  Hypernatremia is a poor prognostic indicator.  Hypokalemia Replace oral potassium  Dyslipidemia Continue Pravachol  Dementia with behavioral disturbance (HCC) Continue Aricept, Zoloft and Risperdal.        Subjective: Patient asked me for a drink today and was able to take a few sips through a straw.  Ate a few bites for me this morning.  Able to talk better today than yesterday.  Sodium now in the normal range.  Admitted with hyponatremia and pneumonia.  Physical Exam: Vitals:   12/19/23 1350 12/19/23 1623 12/20/23 0234 12/20/23 0756  BP: 115/77 132/72 133/71 (!) 145/81  Pulse: 83 75 64 73  Resp: 16 16 16 17   Temp:  98.4 F (36.9 C) (!) 97.5 F (36.4 C) 98 F (36.7 C)  TempSrc:  Oral  Oral  SpO2:  99% 96% 96% 97%  Weight:       Physical Exam HENT:     Head: Normocephalic.  Eyes:     General: Lids are normal.  Cardiovascular:     Rate and Rhythm: Normal rate and regular rhythm.     Heart sounds: Normal heart sounds, S1 normal and S2 normal.  Pulmonary:     Breath sounds: No decreased breath sounds, wheezing, rhonchi or rales.  Abdominal:     Palpations: Abdomen is soft.     Tenderness: There is no abdominal tenderness.  Musculoskeletal:     Right lower leg: No swelling.     Left lower leg: No swelling.     Comments: Right foot angled down  Skin:    General: Skin is warm.     Findings: No rash.  Neurological:     Mental Status: She is alert.     Data Reviewed: Sodium 143, potassium 3.4, creatinine 0.64  Family Communication: Spoke with POA  Disposition: Status is: Inpatient Remains inpatient appropriate because: Will stop IV fluids today and recheck labs tomorrow.  [facility] will have to evaluate on patient coming back. Planned Discharge Destination: Back to [facility]    Time spent: 28 minutes  Author: Alford Highland, MD 12/20/2023 3:39 PM  For on call review [email or web address].

## 2023-12-20 NOTE — Evaluation (Signed)
 Physical Therapy Evaluation Patient Details Name: Joanna Reed MRN: [id number] DOB: December 11, 1940 Today's Date: 12/20/2023  History of Present Illness  Pt is an 83 y.o. F with medical history significant for anxiety/depression, recurrent UTI, HTN, and dementia who presents to ED from facility following an unwitnessed fall, resulting in closed fractures of R ribs 6, 7, 8.  Clinical Impression  Pt with very limited ability to participate with PT, she was able to intermittently give some limited effort with cued AROM attempts but ultimately was passive or close to it for all tasks.  She was unable to give any PLOF or insight into her mobility and could not state name give any other orientation cuing information.  She was able to sit at EOB briefly w/o direct assist but had little to no LE control, poor tone and limited ability to make any shift or control her balance.  Unsure as to cognitive and functional baseline but it appears she is near her baseline as she is apparently w/c bound.  Will maintain on caseload for now to further assess her ability to participate with mobility per cognition.        If plan is discharge home, recommend the following: Two people to help with walking and/or transfers;Two people to help with bathing/dressing/bathroom;Assistance with cooking/housework;Assistance with feeding;Direct supervision/assist for medications management;Direct supervision/assist for financial management;Assist for transportation;Help with stairs or ramp for entrance;Supervision due to cognitive status   Can travel by private vehicle   No    Equipment Recommendations    Recommendations for Other Services       Functional Status Assessment  (unsure of baseline mobility statis)     Precautions / Restrictions Precautions Precautions: Fall Restrictions Weight Bearing Restrictions Per Provider Order: No      Mobility  Bed Mobility Overal bed mobility: Needs Assistance Bed Mobility:  Supine to Sit, Sit to Supine     Supine to sit: Total assist Sit to supine: Total assist   General bed mobility comments: pt groaning in seemingly resistant way with PROM/total assist to get to sitting    Transfers                   General transfer comment: pt resistant to getting to EOB, was clearly not wanting to try to total assist to recliner - deferred    Ambulation/Gait                  Stairs            Wheelchair Mobility     Tilt Bed    Modified Rankin (Stroke Patients Only)       Balance Overall balance assessment: Needs assistance Sitting-balance support: Feet supported, Bilateral upper extremity supported Sitting balance-Leahy Scale: Fair Sitting balance - Comments: once assisted to square to EOB pt was able to maintain static sitting but could do no AROM or adjustments when cued                                     Pertinent Vitals/Pain Pain Assessment Pain Assessment: Faces Faces Pain Scale: Hurts a little bit Pain Location: general groaning with all movement, R hand laceration appears to be focus    Home Living Family/patient expects to be discharged to:: Skilled nursing facility                        Prior Function  Prior Level of Function : Patient poor historian/Family not available;Needs assist             Mobility Comments: apparenlty pt is w/c bound ADLs Comments: likely needing total assist at [facility], pt unable to report     Extremity/Trunk Assessment   Upper Extremity Assessment Upper Extremity Assessment: Generalized weakness (no AROM, poor PROM tolerance)    Lower Extremity Assessment Lower Extremity Assessment: Generalized weakness (no AROM, poor PROM tolerance)       Communication   Communication Communication: Impaired    Cognition Arousal: Lethargic Behavior During Therapy: Flat affect, Restless   PT - Cognitive impairments: History of cognitive impairments                        PT - Cognition Comments: unsure of true baseline, however pt really only able to repeat PT's comments/cues and gives no insight as to PLOF, current complaints, etc apart from reactionary groaning Following commands: Impaired Following commands impaired:  (essentially unable to do any AROM as cued)     Cueing Cueing Techniques: Verbal cues, Gestural cues, Tactile cues, Visual cues     General Comments General comments (skin integrity, edema, etc.): unsure of true baseline, but pt is w/c bound at [facility] and likely dependent for most tasks    Exercises     Assessment/Plan    PT Assessment  (3 day trial to assess change from baseline and ability to progress)  PT Problem List         PT Treatment Interventions      PT Goals (Current goals can be found in the Care Plan section)  Acute Rehab PT Goals Patient Stated Goal: unable to state PT Goal Formulation: With patient Time For Goal Achievement: 01/02/24 Potential to Achieve Goals: Poor    Frequency       Co-evaluation               AM-PAC PT "6 Clicks" Mobility  Outcome Measure Help needed turning from your back to your side while in a flat bed without using bedrails?: Total Help needed moving from lying on your back to sitting on the side of a flat bed without using bedrails?: Total Help needed moving to and from a bed to a chair (including a wheelchair)?: Total Help needed standing up from a chair using your arms (e.g., wheelchair or bedside chair)?: Total Help needed to walk in hospital room?: Total Help needed climbing 3-5 steps with a railing? : Total 6 Click Score: 6    End of Session   Activity Tolerance:  (cognition) Patient left: with bed alarm set;with call bell/phone within reach   PT Visit Diagnosis: Muscle weakness (generalized) (M62.81);Unsteadiness on feet (R26.81)    Time: 9629-5284 PT Time Calculation (min) (ACUTE ONLY): 11 min   Charges:   PT Evaluation $PT  Eval Low Complexity: 1 Low   PT General Charges $$ ACUTE PT VISIT: 1 Visit         Malachi Pro, DPT 12/20/2023, 5:09 PM

## 2023-12-20 NOTE — TOC Progression Note (Signed)
 Transition of Care [facility]) - Progression Note    Patient Details  Name: Joanna Reed MRN: [id number] Date of Birth: 1941-08-14  [facility] CM/SW Contact  Marlowe Sax, RN Phone Number: 12/20/2023, 3:30 PM  Clinical Narrative:     Spoke with [facility] and they are aware that she will dc back there they want the DC summary faxed to [phone] prior to DC  Expected Discharge Plan: Assisted Living (with hospice) Barriers to Discharge: Continued Medical Work up  Expected Discharge Plan and Services     Post Acute Care Choice:  (TBD) Living arrangements for the past 2 months: Assisted Living Facility (Memory Care)                                       Social Determinants of Health (SDOH) Interventions SDOH Screenings   Food Insecurity: Patient Unable To Answer (12/19/2023)  Housing: Patient Unable To Answer (12/19/2023)  Recent Concern: Housing - High Risk (10/02/2023)  Transportation Needs: Patient Unable To Answer (10/02/2023)  Utilities: Patient Unable To Answer (10/02/2023)  Social Connections: Patient Unable To Answer (12/19/2023)  Tobacco Use: Low Risk  (12/18/2023)    Readmission Risk Interventions    12/19/2023    2:08 PM  Readmission Risk Prevention Plan  PCP or Specialist Appt within 3-5 Days Complete  Social Work Consult for Recovery Care Planning/Counseling Complete  Medication Review Oceanographer) Complete

## 2023-12-20 NOTE — Progress Notes (Signed)
 Speech Language Pathology Treatment: Dysphagia  Patient Details Name: Joanna Reed MRN: [id number] DOB: 02/20/1941 Today's Date: 12/20/2023 Time: 4010-2725 SLP Time Calculation (min) (ACUTE ONLY): 13 min  Assessment / Plan / Recommendation Clinical Impression  Pt seen for diet tolerance and trials of upgraded textures. Pt continues with AMS; however, moments of clarity and increased speech intelligibility noted. Pt needed HOH assistance with feeding. Per SLP observation, pt tolerating pureed textures and thin liquids without overt s/sx pharyngeal dysphagia. Reduced PO intake per chart review. Pt with s/sx mild oral dysphagia c/b mildly prolonged mastication of solids. Pharyngeal phase appeared [facility] with solids as well. Recommend diet upgrade to mech soft diet with thin liquids and safe swallowing strategies/aspiration precautions as outlined below. SLP to f/u x1 for diet tolerance.   HPI HPI: Pt is a 83 y.o. female who presented to ED with AMS and somnolence. Pt with medical history significant for anxiety, depression and dementia. Head CT, "1. No acute intracranial findings.  2. Atrophy and white matter microvascular disease.  3. Chronic sphenoid sinus disease." CXR "Minimal patchy opacities in the right mid lung, atelectasis versus  infiltrate."      SLP Plan  Continue with current plan of care      Recommendations for follow up therapy are one component of a multi-disciplinary discharge planning process, led by the attending physician.  Recommendations may be updated based on patient status, additional functional criteria and insurance authorization.    Recommendations  Diet recommendations: Dysphagia 3 (mechanical soft);Thin liquid Liquids provided via: Teaspoon;Cup;Straw Medication Administration: Whole meds with puree (vs crushed) Supervision: Full supervision/cueing for compensatory strategies;Trained caregiver to feed patient Compensations: Minimize environmental  distractions;Slow rate;Small sips/bites Postural Changes and/or Swallow Maneuvers: Out of bed for meals;Seated upright 90 degrees;Upright 30-60 min after meal                  Oral care QID;Oral care before and after PO   Frequent or constant Supervision/Assistance Dysphagia, oropharyngeal phase (R13.12)     Continue with current plan of care   Clyde Canterbury, M.S., CCC-SLP Speech-Language Pathologist [facility] [phone] [name])   Woodroe Chen  12/20/2023, 12:06 PM

## 2023-12-20 NOTE — Plan of Care (Signed)
  Problem: Health Behavior/Discharge Planning: Goal: Ability to manage health-related needs will improve Outcome: Progressing   Problem: Clinical Measurements: Goal: Ability to maintain clinical measurements within normal limits will improve Outcome: Progressing Goal: Will remain free from infection Outcome: Progressing Goal: Diagnostic test results will improve Outcome: Progressing Goal: Respiratory complications will improve Outcome: Progressing Goal: Cardiovascular complication will be avoided Outcome: Progressing   Problem: Activity: Goal: Risk for activity intolerance will decrease Outcome: Progressing   Problem: Coping: Goal: Level of anxiety will decrease Outcome: Progressing   Problem: Elimination: Goal: Will not experience complications related to bowel motility Outcome: Progressing Goal: Will not experience complications related to urinary retention Outcome: Progressing   Problem: Pain Managment: Goal: General experience of comfort will improve and/or be controlled Outcome: Progressing   Problem: Safety: Goal: Ability to remain free from injury will improve Outcome: Progressing   Problem: Skin Integrity: Goal: Risk for impaired skin integrity will decrease Outcome: Progressing   Problem: Activity: Goal: Ability to tolerate increased activity will improve Outcome: Progressing   Problem: Clinical Measurements: Goal: Ability to maintain a body temperature in the normal range will improve Outcome: Progressing   Problem: Respiratory: Goal: Ability to maintain adequate ventilation will improve Outcome: Progressing Goal: Ability to maintain a clear airway will improve Outcome: Progressing

## 2023-12-21 DIAGNOSIS — G9341 Metabolic encephalopathy: Secondary | ICD-10-CM | POA: Diagnosis not present

## 2023-12-21 DIAGNOSIS — E870 Hyperosmolality and hypernatremia: Secondary | ICD-10-CM | POA: Diagnosis not present

## 2023-12-21 DIAGNOSIS — J181 Lobar pneumonia, unspecified organism: Secondary | ICD-10-CM | POA: Diagnosis not present

## 2023-12-21 DIAGNOSIS — E876 Hypokalemia: Secondary | ICD-10-CM | POA: Diagnosis not present

## 2023-12-21 LAB — CBC
HCT: 33.2 % — ABNORMAL LOW (ref 36.0–46.0)
Hemoglobin: 10.8 g/dL — ABNORMAL LOW (ref 12.0–15.0)
MCH: 28.6 pg (ref 26.0–34.0)
MCHC: 32.5 g/dL (ref 30.0–36.0)
MCV: 87.8 fL (ref 80.0–100.0)
Platelets: 357 10*3/uL (ref 150–400)
RBC: 3.78 MIL/uL — ABNORMAL LOW (ref 3.87–5.11)
RDW: 12.9 % (ref 11.5–15.5)
WBC: 9.9 10*3/uL (ref 4.0–10.5)
nRBC: 0 % (ref 0.0–0.2)

## 2023-12-21 LAB — BASIC METABOLIC PANEL
Anion gap: 8 (ref 5–15)
BUN: 13 mg/dL (ref 8–23)
CO2: 25 mmol/L (ref 22–32)
Calcium: 8.4 mg/dL — ABNORMAL LOW (ref 8.9–10.3)
Chloride: 110 mmol/L (ref 98–111)
Creatinine, Ser: 0.69 mg/dL (ref 0.44–1.00)
GFR, Estimated: >60 mL/min (ref >60)
Glucose, Bld: 95 mg/dL (ref 70–99)
Potassium: 3.7 mmol/L (ref 3.5–5.1)
Sodium: 143 mmol/L (ref 135–145)

## 2023-12-21 LAB — MAGNESIUM: Magnesium: 2.2 mg/dL (ref 1.7–2.4)

## 2023-12-21 MED ORDER — AMOXICILLIN-POT CLAVULANATE 400-57 MG/5ML PO SUSR: 10.0000 mL | Freq: Two times a day (BID) | ORAL | 0 refills | Status: AC

## 2023-12-21 NOTE — Discharge Summary (Signed)
 Physician Discharge Summary   Patient: Joanna Reed MRN: [id number] DOB: [birth year]  Admit date:     12/18/2023  Discharge date: 12/21/23  Discharge Physician: Alford Highland   PCP: Doreene Nest, NP   Recommendations at discharge:   Hospice to follow at [facility] memory care Patient needs to be fed each meal, encourage drinking.  Patient on dysphagia 3 diet with thin liquids Follow up PCP  Discharge Diagnoses: Principal Problem:   Lobar pneumonia (HCC) Active Problems:   Acute metabolic encephalopathy   Hypernatremia   Hypokalemia   Dementia with behavioral disturbance (HCC)   Dyslipidemia    Hospital Course: 83 year old female past medical history of anxiety dementia and depression presented to the emergency room with acute altered mental status and excessive sleeping at [facility] a whole memory care.  Initial sodium 154 and potassium 3.2.  Patient was started on half-normal saline.  Started on antibiotics for pneumonia.  2/26.  Will change IV fluids to D5 NS and also replace potassium.  Patient on antibiotics for pneumonia 2/27.  Replace potassium.  Sodium now in the normal range.  Will let fluids expire today and recheck labs tomorrow. 2/28. WBC count normal range, sodium normal range.  Assessment and Plan: * Lobar pneumonia (HCC) Sepsis ruled out, did not meet SIRS criteria.  Patient received 3 days of Rocephin and high dose Zithromax.  Diet upgraded to dysphagia 3 diet.  Will switch to augmentin twice a day for three days starting this evening.  Acute metabolic encephalopathy The patient has underlying dementia.  Worsening mental status likely secondary to pneumonia and hypernatremia.  Patient able to answer some questions today.  Needs to be fed each meal.  Hypernatremia Sodium now in the normal range.  Received ivf during hospital course.  Hypernatremia is a poor prognostic indicator.  Agree with hospice at  facility.  Hypokalemia Replaced  Dyslipidemia Continue Pravachol  Dementia with behavioral disturbance (HCC) Continue Aricept, Zoloft and Risperdal.         Consultants: none Procedures performed: none Disposition: Back to memory care with hospice following Diet recommendation:  Dysphagia 3 diet with thin liquids DISCHARGE MEDICATION: Allergies as of 12/21/2023       Reactions   Sulfa Antibiotics         Medication List     TAKE these medications    acetaminophen 325 MG tablet Commonly known as: TYLENOL Take 2 tablets (650 mg total) by mouth every 6 (six) hours as needed for mild pain (pain score 1-3) (or Fever >/= 101).   amoxicillin-clavulanate 400-57 MG/5ML suspension Commonly known as: AUGMENTIN Take 10 mLs by mouth 2 (two) times daily for 3 days.   diclofenac Sodium 1 % Gel Commonly known as: VOLTAREN Apply 2 g topically 2 (two) times daily. (Apply to left knee)   DOK 100 MG capsule Generic drug: Docusate Sodium Take 100 mg by mouth daily.   donepezil 10 MG tablet Commonly known as: ARICEPT Take 10 mg by mouth daily.   folic acid 1 MG tablet Commonly known as: FOLVITE Take 1 mg by mouth daily.   loperamide 2 MG capsule Commonly known as: IMODIUM Take 4 mg by mouth as needed for diarrhea or loose stools.   multivitamin with minerals Tabs tablet Take 1 tablet by mouth daily.   PEG 3350 17 GM/SCOOP Powd Take 17 g by mouth daily.   pravastatin 10 MG tablet Commonly known as: PRAVACHOL Take 10 mg by mouth at bedtime.   risperiDONE 0.25 MG tablet  Commonly known as: RISPERDAL Take 0.25 mg by mouth 2 (two) times daily. 0800/1600   sertraline 100 MG tablet Commonly known as: ZOLOFT Take 200 mg by mouth daily.   zinc oxide 20 % ointment Apply 1 Application topically as needed for irritation. (Apply to buttocks)        Discharge Exam: Filed Weights   12/19/23 0442  Weight: 42.2 kg   Physical Exam HENT:     Head: Normocephalic.   Eyes:     General: Lids are normal.  Cardiovascular:     Rate and Rhythm: Normal rate and regular rhythm.     Heart sounds: Normal heart sounds, S1 normal and S2 normal.  Pulmonary:     Breath sounds: No decreased breath sounds, wheezing, rhonchi or rales.  Abdominal:     Palpations: Abdomen is soft.     Tenderness: There is no abdominal tenderness.  Musculoskeletal:     Right lower leg: No swelling.     Left lower leg: No swelling.     Comments: Right foot angled down  Skin:    General: Skin is warm.     Findings: No rash.  Neurological:     Mental Status: She is alert.      Condition at discharge: fair  The results of significant diagnostics from this hospitalization (including imaging, microbiology, ancillary and laboratory) are listed below for reference.   Imaging Studies: CT Head Wo Contrast Result Date: 12/18/2023 CLINICAL DATA:  Mental status change. EXAM: CT HEAD WITHOUT CONTRAST TECHNIQUE: Contiguous axial images were obtained from the base of the skull through the vertex without intravenous contrast. RADIATION DOSE REDUCTION: This exam was performed according to the departmental dose-optimization program which includes automated exposure control, adjustment of the mA and/or kV according to patient size and/or use of iterative reconstruction technique. COMPARISON:  None Available. FINDINGS: Brain: No acute intracranial hemorrhage. No focal mass lesion. No CT evidence of acute infarction. No midline shift or mass effect. No hydrocephalus. Basilar cisterns are patent. There are periventricular and subcortical white matter hypodensities. Generalized cortical atrophy. Vascular: No hyperdense vessel or unexpected calcification. Skull: Normal. Negative for fracture or focal lesion. Sinuses/Orbits: Opacification of the sphenoid sinus similar prior. Frontal sinuses clear. Orbits normal. Orbits are clear. Other: None. IMPRESSION: 1. No acute intracranial findings. 2. Atrophy and white  matter microvascular disease. 3. Chronic sphenoid sinus disease. Electronically Signed   By: Genevive Bi M.D.   On: 12/18/2023 20:29   DG Chest Port 1 View Result Date: 12/18/2023 CLINICAL DATA:  Altered mental status with leukocytosis. EXAM: PORTABLE CHEST 1 VIEW COMPARISON:  Chest x-ray 11/22/2023.  CT of the chest 10/01/2023. FINDINGS: There are multiple healed right-sided rib fractures. There are minimal patchy opacities in the right mid lung. No pleural effusion or pneumothorax. Cardiomediastinal silhouette is within normal limits. No acute fractures are identified. IMPRESSION: Minimal patchy opacities in the right mid lung, atelectasis versus infiltrate. Electronically Signed   By: Darliss Cheney M.D.   On: 12/18/2023 20:11   DG Chest Portable 1 View Result Date: 11/22/2023 CLINICAL DATA:  Weakness and altered mental status EXAM: PORTABLE CHEST 1 VIEW COMPARISON:  Chest x-ray 10/14/2022 FINDINGS: The heart size and mediastinal contours are within normal limits. Both lungs are clear. The visualized skeletal structures are unremarkable. IMPRESSION: No active disease. Electronically Signed   By: Darliss Cheney M.D.   On: 11/22/2023 22:48   CT Head Wo Contrast Result Date: 11/22/2023 CLINICAL DATA:  Mental status change, unknown cause EXAM:  CT HEAD WITHOUT CONTRAST TECHNIQUE: Contiguous axial images were obtained from the base of the skull through the vertex without intravenous contrast. RADIATION DOSE REDUCTION: This exam was performed according to the departmental dose-optimization program which includes automated exposure control, adjustment of the mA and/or kV according to patient size and/or use of iterative reconstruction technique. COMPARISON:  CT head 09/30/2023 FINDINGS: Brain: Patchy and confluent areas of decreased attenuation are noted throughout the deep and periventricular white matter of the cerebral hemispheres bilaterally, compatible with chronic microvascular ischemic disease. No  evidence of large-territorial acute infarction. No parenchymal hemorrhage. No mass lesion. No extra-axial collection. No mass effect or midline shift. No hydrocephalus. Basilar cisterns are patent. Vascular: No hyperdense vessel. Skull: No acute fracture or focal lesion. Sinuses/Orbits: Bilateral sphenoid mucosal thickening. Associated hypertrophy of the sphenoid sinus wall. Paranasal sinuses and mastoid air cells are clear. The orbits are unremarkable. Other: None. IMPRESSION: 1. No acute intracranial abnormality. 2. 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 Electronically Signed   By: Tish Frederickson M.D.   On: 11/22/2023 17:38    Microbiology: Results for orders placed or performed during the hospital encounter of 12/18/23  Resp panel by RT-PCR (RSV, Flu A&B, Covid) Anterior Nasal Swab     Status: None   Collection Time: 12/18/23  7:28 PM   Specimen: Anterior Nasal Swab  Result Value Ref Range Status   SARS Coronavirus 2 by RT PCR NEGATIVE NEGATIVE Final    Comment: (NOTE) SARS-CoV-2 target nucleic acids are NOT DETECTED.  The SARS-CoV-2 RNA is generally detectable in upper respiratory specimens during the acute phase of infection. The lowest concentration of SARS-CoV-2 viral copies this assay can detect is 138 copies/mL. A negative result does not preclude SARS-Cov-2 infection and should not be used as the sole basis for treatment or other patient management decisions. A negative result may occur with  improper specimen collection/handling, submission of specimen other than nasopharyngeal swab, presence of viral mutation(s) within the areas targeted by this assay, and inadequate number of viral copies(<138 copies/mL). A negative result must be combined with clinical observations, patient history, and epidemiological information. The expected result is Negative.  Fact Sheet for Patients:  BloggerCourse.com  Fact Sheet for Healthcare Providers:   SeriousBroker.it  This test is no t yet approved or cleared by the [place] FDA and  has been authorized for detection and/or diagnosis of SARS-CoV-2 by FDA under an Emergency Use Authorization (EUA). This EUA will remain  in effect (meaning this test can be used) for the duration of the COVID-19 declaration under Section 564(b)(1) of the Act, 21 U.S.C.section 360bbb-3(b)(1), unless the authorization is terminated  or revoked sooner.       Influenza A by PCR NEGATIVE NEGATIVE Final   Influenza B by PCR NEGATIVE NEGATIVE Final    Comment: (NOTE) The Xpert Xpress SARS-CoV-2/FLU/RSV plus assay is intended as an aid in the diagnosis of influenza from Nasopharyngeal swab specimens and should not be used as a sole basis for treatment. Nasal washings and aspirates are unacceptable for Xpert Xpress SARS-CoV-2/FLU/RSV testing.  Fact Sheet for Patients: BloggerCourse.com  Fact Sheet for Healthcare Providers: SeriousBroker.it  This test is not yet approved or cleared by the [place] FDA and has been authorized for detection and/or diagnosis of SARS-CoV-2 by FDA under an Emergency Use Authorization (EUA). This EUA will remain in effect (meaning this test can be used) for the duration of the COVID-19 declaration under Section 564(b)(1) of the Act, 21 U.S.C. section 360bbb-3(b)(1),  unless the authorization is terminated or revoked.     Resp Syncytial Virus by PCR NEGATIVE NEGATIVE Final    Comment: (NOTE) Fact Sheet for Patients: BloggerCourse.com  Fact Sheet for Healthcare Providers: SeriousBroker.it  This test is not yet approved or cleared by the [place] FDA and has been authorized for detection and/or diagnosis of SARS-CoV-2 by FDA under an Emergency Use Authorization (EUA). This EUA will remain in effect (meaning this test can be used) for  the duration of the COVID-19 declaration under Section 564(b)(1) of the Act, 21 U.S.C. section 360bbb-3(b)(1), unless the authorization is terminated or revoked.  Performed at [facility], [address] [place], [place] [address]   Culture, blood (routine x 2)     Status: None (Preliminary result)   Collection Time: 12/18/23  8:56 PM   Specimen: BLOOD  Result Value Ref Range Status   Specimen Description BLOOD LEFT ASSIST CONTROL  Final   Special Requests   Final    BOTTLES DRAWN AEROBIC AND ANAEROBIC Blood Culture results may not be optimal due to an inadequate volume of blood received in culture bottles   Culture   Final    NO GROWTH 3 DAYS Performed at [facility], [address] [place], [place] [address]    Report Status PENDING  Incomplete  Culture, blood (routine x 2)     Status: None (Preliminary result)   Collection Time: 12/18/23  8:56 PM   Specimen: BLOOD  Result Value Ref Range Status   Specimen Description BLOOD RIGHT ASSIST CONTROL  Final   Special Requests   Final    BOTTLES DRAWN AEROBIC AND ANAEROBIC Blood Culture adequate volume   Culture   Final    NO GROWTH 3 DAYS Performed at [facility], [address] [place], [place] [address]    Report Status PENDING  Incomplete    Labs: CBC: Recent Labs  Lab 12/18/23 1649 12/19/23 0429 12/21/23 0510  WBC 15.1* 11.3* 9.9  NEUTROABS 11.9*  --   --   HGB 11.5* 10.2* 10.8*  HCT 36.9 32.8* 33.2*  MCV 91.8 90.1 87.8  PLT 449* 403* 357   Basic Metabolic Panel: Recent Labs  Lab 12/18/23 1649 12/19/23 0429 12/20/23 0528 12/21/23 0510  NA 154* 154* 143 143  K 3.2* 3.1* 3.4* 3.7  CL 116* 121* 111 110  CO2 27 24 24 25   GLUCOSE 100* 103* 110* 95  BUN 33* 28* 14 13  CREATININE 0.79 0.65 0.64 0.69  CALCIUM 8.9 8.3* 8.3* 8.4*  MG  --   --  2.0 2.2  PHOS  --   --  2.6  --    Liver Function Tests: Recent Labs  Lab 12/18/23 1649  AST 16  ALT 16  ALKPHOS 65  BILITOT  0.4  PROT 7.2  ALBUMIN 3.3*   CBG: No results for input(s): "GLUCAP" in the last 168 hours.  Discharge time spent: greater than 30 minutes.  Signed: Alford Highland, MD [facility] 12/21/2023

## 2023-12-21 NOTE — Care Management Important Message (Signed)
 Important Message  Patient Details  Name: Joanna Reed MRN: [id number] Date of Birth: [birth year]   Important Message Given:  Yes - Medicare IM     Cristela Blue, CMA 12/21/2023, 11:04 AM

## 2023-12-21 NOTE — Plan of Care (Signed)
  Problem: Education: Goal: Knowledge of General Education information will improve Description: Including pain rating scale, medication(s)/side effects and non-pharmacologic comfort measures Outcome: Progressing   Problem: Activity: Goal: Risk for activity intolerance will decrease Outcome: Progressing   Problem: Elimination: Goal: Will not experience complications related to bowel motility Outcome: Progressing   Problem: Pain Managment: Goal: General experience of comfort will improve and/or be controlled Outcome: Progressing   Problem: Activity: Goal: Ability to tolerate increased activity will improve Outcome: Progressing   Problem: Clinical Measurements: Goal: Ability to maintain a body temperature in the normal range will improve Outcome: Progressing

## 2023-12-21 NOTE — TOC Transition Note (Signed)
 Transition of Care [facility]) - Discharge Note   Patient Details  Name: Joanna Reed MRN: [id number] Date of Birth: Mar 03, 1941  Transition of Care [facility]) CM/SW Contact:  Marlowe Sax, RN Phone Number: 12/21/2023, 11:01 AM   Clinical Narrative:     Angelica Pou and DC summary to Diamantina Monks, Confirmed that they received it, I called Eber Jones the [facility] and notified her that EMS would transport to [facility] today, I called EMS to arrange transport    Barriers to Discharge: Continued Medical Work up   Patient Goals and CMS Choice            Discharge Placement                       Discharge Plan and Services Additional resources added to the After Visit Summary for       Post Acute Care Choice:  (TBD)                               Social Drivers of Health (SDOH) Interventions SDOH Screenings   Food Insecurity: Patient Unable To Answer (12/19/2023)  Housing: Patient Unable To Answer (12/19/2023)  Recent Concern: Housing - High Risk (10/02/2023)  Transportation Needs: Patient Unable To Answer (10/02/2023)  Utilities: Patient Unable To Answer (10/02/2023)  Social Connections: Patient Unable To Answer (12/19/2023)  Tobacco Use: Low Risk  (12/18/2023)     Readmission Risk Interventions    12/19/2023    2:08 PM  Readmission Risk Prevention Plan  PCP or Specialist Appt within 3-5 Days Complete  Social Work Consult for Recovery Care Planning/Counseling Complete  Medication Review Oceanographer) Complete

## 2023-12-21 NOTE — TOC Progression Note (Signed)
 Transition of Care [facility]) - Progression Note    Patient Details  Name: Joanna Reed MRN: [id number] Date of Birth: 11-Jul-1941  [facility]) CM/SW Contact  Marlowe Sax, RN Phone Number: 12/21/2023, 10:09 AM  Clinical Narrative:     Sherron Monday with Telford Nab at [facility] memory care I asked if they could take her back as a total care including feeding at every meal, she stated that they will take her back anyway  Expected Discharge Plan: Assisted Living (with hospice) Barriers to Discharge: Continued Medical Work up  Expected Discharge Plan and Services     Post Acute Care Choice:  (TBD) Living arrangements for the past 2 months: Assisted Living Facility (Memory Care)                                       Social Determinants of Health (SDOH) Interventions SDOH Screenings   Food Insecurity: Patient Unable To Answer (12/19/2023)  Housing: Patient Unable To Answer (12/19/2023)  Recent Concern: Housing - High Risk (10/02/2023)  Transportation Needs: Patient Unable To Answer (10/02/2023)  Utilities: Patient Unable To Answer (10/02/2023)  Social Connections: Patient Unable To Answer (12/19/2023)  Tobacco Use: Low Risk  (12/18/2023)    Readmission Risk Interventions    12/19/2023    2:08 PM  Readmission Risk Prevention Plan  PCP or Specialist Appt within 3-5 Days Complete  Social Work Consult for Recovery Care Planning/Counseling Complete  Medication Review Oceanographer) Complete

## 2023-12-21 NOTE — [place]2 (Addendum)
 [place] MEDICAID FL2 LEVEL OF CARE FORM     IDENTIFICATION  Patient Name: Joanna Reed Birthdate: August 23, 1941 Sex: female Admission Date (Current Location): 12/18/2023  [facility] and [place] Number:  Chiropodist and Address:  [facility], [address] [place], [place] [address]      Provider Number: [id number]  Attending Physician Name and Address:  Alford Highland, MD  Relative Name and Phone Number:  Therisa Doyne  Friend  Emergency Contact  [phone]    Current Level of Care: Hospital Recommended Level of Care: Assisted Living Facility, Memory Care Prior Approval Number:    Date Approved/Denied:   PASRR Number:    Discharge Plan: Other (Comment) (memory care)    Current Diagnoses: Patient Active Problem List   Diagnosis Date Noted   Dementia with behavioral disturbance (HCC) 12/19/2023   Dyslipidemia 12/19/2023   Hypokalemia 12/19/2023   Hypernatremia 12/19/2023   Lobar pneumonia (HCC) 12/19/2023   Influenza A 11/23/2023   Primary hypertension 11/23/2023   Multiple closed fractures of ribs of right side 10/01/2023   Unwitnessed fall 10/01/2023   AMS (altered mental status) 09/15/2022   Anemia 09/15/2022   Generalized anxiety disorder 12/31/2020   Dysthymia 12/31/2020   Dementia without behavioral disturbance (HCC) 12/31/2020   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   Urinary tract infection 12/29/2020   Fall at home, initial encounter 12/29/2020   Acute metabolic encephalopathy 12/29/2020   Pressure injury of skin 12/29/2020   Preventative health care 03/19/2018   Chronic knee pain 02/27/2017   Medicare annual wellness visit, subsequent 01/28/2015   Anxiety and depression 01/19/2015   Acute shoulder pain 01/19/2015    Orientation RESPIRATION BLADDER Height & Weight     Self  Normal Incontinent Weight: 42.2 kg Height:     BEHAVIORAL SYMPTOMS/MOOD NEUROLOGICAL BOWEL NUTRITION STATUS      Incontinent  Diet (Dysphagia 3, thin liquid)  AMBULATORY STATUS COMMUNICATION OF NEEDS Skin   Extensive Assist Verbally Skin abrasions (r hand laceration)                       Personal Care Assistance Level of Assistance  Bathing, Feeding, Dressing Bathing Assistance: Maximum assistance Feeding assistance: Maximum assistance Dressing Assistance: Maximum assistance     Functional Limitations Info  Sight, Hearing, Speech Sight Info: Adequate Hearing Info: Adequate Speech Info: Adequate    SPECIAL CARE FACTORS FREQUENCY                       Contractures      Additional Factors Info  Code Status, Allergies Code Status Info: DNR limited Allergies Info: Sulfa Antibiotics           Current Medications (12/21/2023):  This is the current hospital active medication list Current Facility-Administered Medications  Medication Dose Route Frequency Provider Last Rate Last Admin   acetaminophen (TYLENOL) tablet 650 mg  650 mg Oral Q6H PRN Mansy, Jan A, MD       Or   acetaminophen (TYLENOL) suppository 650 mg  650 mg Rectal Q6H PRN Mansy, Jan A, MD       azithromycin (ZITHROMAX) 500 mg in sodium chloride 0.9 % 250 mL IVPB  500 mg Intravenous Q24H Mansy, Jan A, MD 250 mL/hr at 12/20/23 2234 500 mg at 12/20/23 2234   cefTRIAXone (ROCEPHIN) 2 g in sodium chloride 0.9 % 100 mL IVPB  2 g Intravenous Q24H Mansy, Jan A, MD 200 mL/hr at 12/20/23 2040  2 g at 12/20/23 2040   chlorpheniramine-HYDROcodone (TUSSIONEX) 10-8 MG/5ML suspension 5 mL  5 mL Oral Q12H PRN Mansy, Jan A, MD       diclofenac Sodium (VOLTAREN) 1 % topical gel 2 g  2 g Topical BID Mansy, Jan A, MD   2 g at 12/21/23 0834   docusate sodium (COLACE) capsule 100 mg  100 mg Oral Daily Mansy, Jan A, MD   100 mg at 12/21/23 1610   donepezil (ARICEPT) tablet 10 mg  10 mg Oral Daily Mansy, Jan A, MD   10 mg at 12/21/23 9604   enoxaparin (LOVENOX) injection 30 mg  30 mg Subcutaneous Q24H Orson Aloe, RPH   30 mg at 12/21/23 5409    folic acid (FOLVITE) tablet 1 mg  1 mg Oral Daily Mansy, Jan A, MD   1 mg at 12/21/23 8119   guaiFENesin (MUCINEX) 12 hr tablet 600 mg  600 mg Oral BID Mansy, Jan A, MD   600 mg at 12/21/23 1478   ipratropium-albuterol (DUONEB) 0.5-2.5 (3) MG/3ML nebulizer solution 3 mL  3 mL Nebulization Q4H PRN Alford Highland, MD       magnesium hydroxide (MILK OF MAGNESIA) suspension 30 mL  30 mL Oral Daily PRN Mansy, Jan A, MD       multivitamin with minerals tablet 1 tablet  1 tablet Oral Daily Mansy, Jan A, MD   1 tablet at 12/21/23 0832   ondansetron [facility]) tablet 4 mg  4 mg Oral Q6H PRN Mansy, Jan A, MD       Or   ondansetron [facility]) injection 4 mg  4 mg Intravenous Q6H PRN Mansy, Jan A, MD       polyethylene glycol (MIRALAX / GLYCOLAX) packet 17 g  17 g Oral Daily Mansy, Jan A, MD   17 g at 12/21/23 2956   pravastatin (PRAVACHOL) tablet 10 mg  10 mg Oral QHS Mansy, Jan A, MD   10 mg at 12/20/23 2219   risperiDONE (RISPERDAL) tablet 0.25 mg  0.25 mg Oral BID Mansy, Jan A, MD   0.25 mg at 12/21/23 2130   sertraline (ZOLOFT) tablet 200 mg  200 mg Oral Daily Mansy, Jan A, MD   200 mg at 12/21/23 8657   traZODone (DESYREL) tablet 25 mg  25 mg Oral QHS PRN Mansy, Jan A, MD       zinc oxide 20 % ointment 1 Application  1 Application Topical PRN Mansy, Vernetta Honey, MD         Discharge Medications: TAKE these medications     acetaminophen 325 MG tablet Commonly known as: TYLENOL Take 2 tablets (650 mg total) by mouth every 6 (six) hours as needed for mild pain (pain score 1-3) (or Fever >/= 101).    amoxicillin-clavulanate 400-57 MG/5ML suspension Commonly known as: AUGMENTIN Take 10 mLs by mouth 2 (two) times daily for 3 days.    diclofenac Sodium 1 % Gel Commonly known as: VOLTAREN Apply 2 g topically 2 (two) times daily. (Apply to left knee)    DOK 100 MG capsule Generic drug: Docusate Sodium Take 100 mg by mouth daily.    donepezil 10 MG tablet Commonly known as: ARICEPT Take 10 mg by mouth  daily.    folic acid 1 MG tablet Commonly known as: FOLVITE Take 1 mg by mouth daily.    loperamide 2 MG capsule Commonly known as: IMODIUM Take 4 mg by mouth as needed for diarrhea or loose stools.  multivitamin with minerals Tabs tablet Take 1 tablet by mouth daily.    PEG 3350 17 GM/SCOOP Powd Take 17 g by mouth daily.    pravastatin 10 MG tablet Commonly known as: PRAVACHOL Take 10 mg by mouth at bedtime.    risperiDONE 0.25 MG tablet Commonly known as: RISPERDAL Take 0.25 mg by mouth 2 (two) times daily. 0800/1600    sertraline 100 MG tablet Commonly known as: ZOLOFT Take 200 mg by mouth daily.    zinc oxide 20 % ointment Apply 1 Application topically as needed for irritation. (Apply to buttocks)           Relevant Imaging Results:  Relevant Lab Results:   Additional Information SS#: [id number]  Marlowe Sax, RN

## 2023-12-21 NOTE — Progress Notes (Signed)
 Report given to admitting nurse. Facility informed on this patient. She is going back to the same place. Update given. Paper work sent with EMS

## 2023-12-23 LAB — CULTURE, BLOOD (ROUTINE X 2)
Culture: NO GROWTH
Culture: NO GROWTH
Special Requests: ADEQUATE
